# Patient Record
Sex: Male | Born: 1945 | Race: Black or African American | Hispanic: No | Marital: Single | Smoking: Former smoker
Health system: Southern US, Community
[De-identification: ages and names within clinical notes are randomized; demographics above are authoritative.]

## PROBLEM LIST (undated history)

## (undated) DIAGNOSIS — D126 Benign neoplasm of colon, unspecified: Secondary | ICD-10-CM

## (undated) DIAGNOSIS — Z9289 Personal history of other medical treatment: Secondary | ICD-10-CM

## (undated) DIAGNOSIS — I10 Essential (primary) hypertension: Secondary | ICD-10-CM

## (undated) DIAGNOSIS — K5790 Diverticulosis of intestine, part unspecified, without perforation or abscess without bleeding: Secondary | ICD-10-CM

## (undated) DIAGNOSIS — I4891 Unspecified atrial fibrillation: Secondary | ICD-10-CM

## (undated) DIAGNOSIS — I639 Cerebral infarction, unspecified: Secondary | ICD-10-CM

## (undated) DIAGNOSIS — R42 Dizziness and giddiness: Secondary | ICD-10-CM

## (undated) DIAGNOSIS — M1990 Unspecified osteoarthritis, unspecified site: Secondary | ICD-10-CM

## (undated) DIAGNOSIS — I2510 Atherosclerotic heart disease of native coronary artery without angina pectoris: Secondary | ICD-10-CM

## (undated) DIAGNOSIS — T7840XA Allergy, unspecified, initial encounter: Secondary | ICD-10-CM

## (undated) DIAGNOSIS — I6529 Occlusion and stenosis of unspecified carotid artery: Secondary | ICD-10-CM

## (undated) DIAGNOSIS — K635 Polyp of colon: Secondary | ICD-10-CM

## (undated) DIAGNOSIS — U071 COVID-19: Secondary | ICD-10-CM

## (undated) DIAGNOSIS — E870 Hyperosmolality and hypernatremia: Secondary | ICD-10-CM

## (undated) DIAGNOSIS — I4892 Unspecified atrial flutter: Secondary | ICD-10-CM

## (undated) DIAGNOSIS — A045 Campylobacter enteritis: Secondary | ICD-10-CM

## (undated) DIAGNOSIS — E785 Hyperlipidemia, unspecified: Secondary | ICD-10-CM

## (undated) DIAGNOSIS — K219 Gastro-esophageal reflux disease without esophagitis: Secondary | ICD-10-CM

## (undated) HISTORY — DX: Polyp of colon: K63.5

## (undated) HISTORY — PX: TONSILLECTOMY: SUR1361

## (undated) HISTORY — DX: Personal history of other medical treatment: Z92.89

## (undated) HISTORY — PX: CARDIAC CATHETERIZATION: SHX172

## (undated) HISTORY — DX: Unspecified atrial fibrillation: I48.91

## (undated) HISTORY — DX: Gastro-esophageal reflux disease without esophagitis: K21.9

## (undated) HISTORY — DX: Allergy, unspecified, initial encounter: T78.40XA

## (undated) HISTORY — DX: Essential (primary) hypertension: I10

## (undated) HISTORY — DX: COVID-19: U07.1

## (undated) HISTORY — DX: Campylobacter enteritis: A04.5

## (undated) HISTORY — DX: Dizziness and giddiness: R42

## (undated) HISTORY — DX: Unspecified atrial flutter: I48.92

## (undated) HISTORY — DX: Benign neoplasm of colon, unspecified: D12.6

## (undated) HISTORY — PX: KNEE SURGERY: SHX244

## (undated) HISTORY — DX: Cerebral infarction, unspecified: I63.9

## (undated) HISTORY — DX: Atherosclerotic heart disease of native coronary artery without angina pectoris: I25.10

## (undated) HISTORY — DX: Diverticulosis of intestine, part unspecified, without perforation or abscess without bleeding: K57.90

## (undated) HISTORY — DX: Unspecified osteoarthritis, unspecified site: M19.90

## (undated) HISTORY — DX: Occlusion and stenosis of unspecified carotid artery: I65.29

## (undated) HISTORY — DX: Hyperosmolality and hypernatremia: E87.0

## (undated) HISTORY — DX: Hyperlipidemia, unspecified: E78.5

---

## 1968-10-23 HISTORY — PX: SHOULDER ARTHROSCOPY: SHX128

## 1968-10-23 HISTORY — PX: KNEE ARTHROSCOPY: SUR90

## 2011-10-24 HISTORY — PX: CARDIOVERSION: SHX1299

## 2011-11-21 ENCOUNTER — Encounter: Payer: Self-pay | Admitting: Cardiovascular Disease

## 2011-11-21 LAB — BASIC METABOLIC PANEL
Anion Gap: 10 (ref 7–16)
BUN: 16 mg/dL (ref 7–18)
Calcium, Total: 9 mg/dL (ref 8.5–10.1)
Chloride: 106 mmol/L (ref 98–107)
Co2: 30 mmol/L (ref 21–32)
Creatinine: 0.93 mg/dL (ref 0.60–1.30)
EGFR (African American): >60
EGFR (Non-African Amer.): >60
Glucose: 87 mg/dL (ref 65–99)
Osmolality: 291 (ref 275–301)
Potassium: 4.6 mmol/L (ref 3.5–5.1)
Sodium: 146 mmol/L — ABNORMAL HIGH (ref 136–145)

## 2011-11-21 LAB — CBC
HCT: 39.4 % — ABNORMAL LOW (ref 40.0–52.0)
HGB: 13.7 g/dL (ref 13.0–18.0)
MCH: 33.2 pg (ref 26.0–34.0)
MCHC: 34.7 g/dL (ref 32.0–36.0)
MCV: 96 fL (ref 80–100)
Platelet: 152 10*3/uL (ref 150–440)
RBC: 4.12 10*6/uL — ABNORMAL LOW (ref 4.40–5.90)
RDW: 12.9 % (ref 11.5–14.5)
WBC: 10.6 10*3/uL (ref 3.8–10.6)

## 2011-11-21 LAB — TROPONIN I: Troponin-I: <0.02 ng/mL

## 2011-11-22 ENCOUNTER — Encounter: Payer: Self-pay | Admitting: Cardiovascular Disease

## 2011-11-22 ENCOUNTER — Inpatient Hospital Stay: Payer: Self-pay | Admitting: Internal Medicine

## 2011-11-22 DIAGNOSIS — I059 Rheumatic mitral valve disease, unspecified: Secondary | ICD-10-CM

## 2011-11-22 DIAGNOSIS — I4891 Unspecified atrial fibrillation: Secondary | ICD-10-CM

## 2011-11-22 DIAGNOSIS — G459 Transient cerebral ischemic attack, unspecified: Secondary | ICD-10-CM

## 2011-11-22 LAB — LIPID PANEL
Cholesterol: 192 mg/dL (ref 0–200)
HDL Cholesterol: 62 mg/dL — ABNORMAL HIGH (ref 40–60)
Ldl Cholesterol, Calc: 120 mg/dL — ABNORMAL HIGH (ref 0–100)
Triglycerides: 52 mg/dL (ref 0–200)
VLDL Cholesterol, Calc: 10 mg/dL (ref 5–40)

## 2011-11-22 LAB — APTT
Activated PTT: 30 secs (ref 23.6–35.9)
Activated PTT: >160 secs (ref 23.6–35.9)

## 2011-11-22 LAB — CK TOTAL AND CKMB (NOT AT ARMC)
CK, Total: 102 U/L (ref 35–232)
CK-MB: 0.9 ng/mL (ref 0.5–3.6)

## 2011-11-22 LAB — TROPONIN I: Troponin-I: <0.02 ng/mL

## 2011-11-24 HISTORY — PX: OTHER SURGICAL HISTORY: SHX169

## 2011-12-01 ENCOUNTER — Ambulatory Visit (INDEPENDENT_AMBULATORY_CARE_PROVIDER_SITE_OTHER): Payer: Medicare Other | Admitting: Internal Medicine

## 2011-12-01 ENCOUNTER — Encounter: Payer: Self-pay | Admitting: Internal Medicine

## 2011-12-01 ENCOUNTER — Encounter: Payer: Self-pay | Admitting: Cardiovascular Disease

## 2011-12-01 ENCOUNTER — Encounter: Payer: Medicare Other | Admitting: Cardiovascular Disease

## 2011-12-01 ENCOUNTER — Ambulatory Visit (INDEPENDENT_AMBULATORY_CARE_PROVIDER_SITE_OTHER): Payer: Medicare Other | Admitting: Cardiovascular Disease

## 2011-12-01 VITALS — BP 123/79 | HR 80 | Ht 71.0 in | Wt 196.8 lb

## 2011-12-01 DIAGNOSIS — Z23 Encounter for immunization: Secondary | ICD-10-CM

## 2011-12-01 DIAGNOSIS — Z1211 Encounter for screening for malignant neoplasm of colon: Secondary | ICD-10-CM

## 2011-12-01 DIAGNOSIS — G459 Transient cerebral ischemic attack, unspecified: Secondary | ICD-10-CM

## 2011-12-01 DIAGNOSIS — I4891 Unspecified atrial fibrillation: Secondary | ICD-10-CM | POA: Insufficient documentation

## 2011-12-01 DIAGNOSIS — F17200 Nicotine dependence, unspecified, uncomplicated: Secondary | ICD-10-CM

## 2011-12-01 DIAGNOSIS — E785 Hyperlipidemia, unspecified: Secondary | ICD-10-CM | POA: Insufficient documentation

## 2011-12-01 DIAGNOSIS — J309 Allergic rhinitis, unspecified: Secondary | ICD-10-CM | POA: Insufficient documentation

## 2011-12-01 MED ORDER — ZOSTER VACCINE LIVE 19400 UNT/0.65ML ~~LOC~~ SOLR: 0.6500 mL | Freq: Once | SUBCUTANEOUS | Status: AC

## 2011-12-01 MED ORDER — METOPROLOL TARTRATE 25 MG PO TABS: 25.0000 mg | ORAL_TABLET | Freq: Two times a day (BID) | ORAL | Status: DC

## 2011-12-01 MED ORDER — FLUTICASONE PROPIONATE 50 MCG/ACT NA SUSP: 2.0000 | Freq: Every day | NASAL | Status: DC

## 2011-12-01 MED ORDER — LOVASTATIN 20 MG PO TABS: 20.0000 mg | ORAL_TABLET | Freq: Every day | ORAL | Status: DC

## 2011-12-01 MED ORDER — DABIGATRAN ETEXILATE MESYLATE 150 MG PO CAPS
150.0000 mg | ORAL_CAPSULE | Freq: Two times a day (BID) | ORAL | Status: DC
Start: 2011-12-01 — End: 2011-12-25

## 2011-12-01 NOTE — Assessment & Plan Note (Addendum)
He was evaluated by Dr. Chestine Spore in the hospital. CT Scan and MRI did not show significant stroke. We have suggested he stay on his anticoagulation. Now back to baseline.

## 2011-12-01 NOTE — Assessment & Plan Note (Signed)
Followed by cardiology. On Pradaxa for anticoagulation. Metoprolol dose was increased today. Will followup with cardiology in one month and followup here in 3 months.

## 2011-12-01 NOTE — Patient Instructions (Addendum)
You are doing well. Please increase the metoprolol to 1 full pill twice a day Continue on the pradaxa twice a day  Please call us if you have new issues that need to be addressed before your next appt.  Your physician wants you to follow-up in: 1 months.

## 2011-12-01 NOTE — Assessment & Plan Note (Signed)
We will continue pradaxa BID. We have suggested he increase his metoprolol to 25 mg b.i.d.. We will continue anticoagulation for one month and discuss starting amiodarone and possible cardioversion at that time.

## 2011-12-01 NOTE — Progress Notes (Signed)
Patient ID: Carl Alvarez, male    DOB: 04-29-1946, 66 y.o.   MRN: [id number]  HPI Comments: Carl Alvarez is a pleasant 66 yo gentleman with long history of smoking, hypertension, hyperlipidemia who presented to [facility] on November 22 2011 with right upper extremity tingling and right facial numbness, felt to have a possible TIA, found to be in atrial fibrillation.  He was started on low-dose beta blocker,pradaxa 150 mg b.i.d., and now presents to the office for routine followup.  Overall he feels well, much better than when he went into the hospital. He is uncertain how long he has been in atrial fibrillation. He is relatively asymptomatic on today's visit. 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 He has felt occasional palpitations at home in the past week. He has stopped smoking since he left the hospital CT Scan of the head did not show any significant stroke Chest x-ray was clear apart from hyperinflation consistent with COPD  Echocardiogram November 22, 2011 shows ejection fraction 50-55%, otherwise normal study  EKG today shows atrial fibrillation with ventricular rate 90 beats per minute, no significant ST or T wave changes  Total cholesterol 192, LDL 120, HDL 62   Outpatient Encounter Prescriptions as of 12/01/2011  Medication Sig Dispense Refill  . aspirin EC 81 MG tablet Take 81 mg by mouth daily.      . dabigatran (PRADAXA) 150 MG CAPS Take 1 capsule (150 mg total) by mouth every 12 (twelve) hours.  60 capsule  6  . lovastatin (MEVACOR) 20 MG tablet Take 1 tablet (20 mg total) by mouth at bedtime.  90 tablet  3  . metoprolol tartrate (LOPRESSOR) 25 MG tablet Take 1/2 tablet (25 mg total) by mouth 2 (two) times daily.  60 tablet  6     Review of Systems  Constitutional: Negative.   HENT: Negative.   Eyes: Negative.   Respiratory: Negative.   Cardiovascular: Positive for palpitations.  Gastrointestinal: Negative.   Musculoskeletal: Negative.   Skin:  Negative.   Neurological: Negative.   Hematological: Negative.   Psychiatric/Behavioral: Negative.   All other systems reviewed and are negative.    BP 123/79  Pulse 80  Ht 5\' 11"  (1.803 m)  Wt 196 lb 12.8 oz (89.268 kg)  BMI 27.45 kg/m2  Physical Exam  Nursing note and vitals reviewed. Constitutional: He is oriented to person, place, and time. He appears well-developed and well-nourished.  HENT:  Head: Normocephalic.  Nose: Nose normal.  Mouth/Throat: Oropharynx is clear and moist.  Eyes: Conjunctivae are normal. Pupils are equal, round, and reactive to light.  Neck: Normal range of motion. Neck supple. No JVD present.  Cardiovascular: Normal rate, regular rhythm, S1 normal, S2 normal, normal heart sounds and intact distal pulses.  Exam reveals no gallop and no friction rub.   No murmur heard. Pulmonary/Chest: Effort normal and breath sounds normal. No respiratory distress. He has no wheezes. He has no rales. He exhibits no tenderness.  Abdominal: Soft. Bowel sounds are normal. He exhibits no distension. There is no tenderness.  Musculoskeletal: Normal range of motion. He exhibits no edema and no tenderness.  Lymphadenopathy:    He has no cervical adenopathy.  Neurological: He is alert and oriented to person, place, and time. Coordination normal.  Skin: Skin is warm and dry. No rash noted. No erythema.  Psychiatric: He has a normal mood and affect. His behavior is normal. Judgment and thought content normal.  Assessment and Plan

## 2011-12-01 NOTE — Assessment & Plan Note (Signed)
Symptoms have resolved. We'll continue to monitor. Encouraged continued smoking cessation. We'll continue anticoagulation.

## 2011-12-01 NOTE — Progress Notes (Signed)
Subjective:    Patient ID: Carl Alvarez, male    DOB: Aug 26, 1946, 66 y.o.   MRN: [id number]  HPI 66 year old male with history of atrophic fibrillation, TIA/stroke, hyperlipidemia presents to establish care. He reports that he was healthy until a few weeks ago when he developed numbness extending from his right neck down his right shoulder and upper arm. He ultimately sought care and evaluation for this. During his evaluation he was noted to be in atrial fibrillation with rapid ventricular response. He ultimately had CT and MRI evaluation looking for stroke, and these studies were normal. His symptoms resolved with control of his heart rate and anticoagulation. He reports that his numbness in his right arm has completely resolved. He feels back to his baseline. He has quit smoking. He reports that his energy level has significantly improved. He denies any recent chest pain, palpitations, diaphoresis, or other complaints.  He is concerned today about several week history of increased nasal drainage. He notes that he occasionally sneezes and has clear runny nasal drainage. He denies any sinus pressure, headache, fever, chills.  Outpatient Encounter Prescriptions as of 12/01/2011  Medication Sig Dispense Refill  . aspirin EC 81 MG tablet Take 81 mg by mouth daily.      . Cod Liver Oil 5000-500 UNIT/5ML OIL Take by mouth daily.      . dabigatran (PRADAXA) 150 MG CAPS Take 1 capsule (150 mg total) by mouth every 12 (twelve) hours.  60 capsule  6  . lovastatin (MEVACOR) 20 MG tablet Take 1 tablet (20 mg total) by mouth at bedtime.  90 tablet  3  . metoprolol tartrate (LOPRESSOR) 25 MG tablet Take 1 tablet (25 mg total) by mouth 2 (two) times daily.  60 tablet  6  . fluticasone (FLONASE) 50 MCG/ACT nasal spray Place 2 sprays into the nose daily.  16 g  6  . zoster vaccine live, PF, (ZOSTAVAX) 82956 UNT/0.65ML injection Inject 19,400 Units into the skin once.  1 each  0    Review of Systems    Constitutional: Negative for fever, chills, activity change, appetite change, fatigue and unexpected weight change.  HENT: Positive for rhinorrhea, sneezing and postnasal drip.   Eyes: Negative for visual disturbance.  Respiratory: Negative for cough and shortness of breath.   Cardiovascular: Negative for chest pain, palpitations and leg swelling.  Gastrointestinal: Negative for abdominal pain and abdominal distention.  Genitourinary: Negative for dysuria, urgency and difficulty urinating.  Musculoskeletal: Negative for arthralgias and gait problem.  Skin: Negative for color change and rash.  Hematological: Negative for adenopathy.  Psychiatric/Behavioral: Negative for sleep disturbance and dysphoric mood. The patient is not nervous/anxious.    BP 110/62  Pulse 92  Temp(Src) 97.7 F (36.5 C) (Oral)  Wt 196 lb (88.905 kg)  SpO2 99%     Objective:   Physical Exam  Constitutional: He is oriented to person, place, and time. He appears well-developed and well-nourished. No distress.  HENT:  Head: Normocephalic and atraumatic.  Right Ear: External ear normal.  Left Ear: External ear normal.  Nose: Nose normal.  Mouth/Throat: Oropharynx is clear and moist. No oropharyngeal exudate.  Eyes: Conjunctivae and EOM are normal. Pupils are equal, round, and reactive to light. Right eye exhibits no discharge. Left eye exhibits no discharge. No scleral icterus.  Neck: Normal range of motion. Neck supple. No tracheal deviation present. No thyromegaly present.  Cardiovascular: Normal rate, regular rhythm and normal heart sounds.  Exam reveals no gallop and no friction  rub.   No murmur heard. Pulmonary/Chest: Effort normal and breath sounds normal. No respiratory distress. He has no wheezes. He has no rales. He exhibits no tenderness.  Abdominal: Soft. Bowel sounds are normal. He exhibits no distension and no mass. There is no tenderness. There is no rebound and no guarding.  Musculoskeletal: Normal  range of motion. He exhibits no edema.  Lymphadenopathy:    He has no cervical adenopathy.  Neurological: He is alert and oriented to person, place, and time. No cranial nerve deficit. Coordination normal.  Skin: Skin is warm and dry. No rash noted. He is not diaphoretic. No erythema. No pallor.  Psychiatric: He has a normal mood and affect. His behavior is normal. Judgment and thought content normal.          Assessment & Plan:

## 2011-12-01 NOTE — Assessment & Plan Note (Signed)
He asked about options for control her cholesterol besides statin medication. Strongly encouraged him to stay on statin drug given his recent TIA/stroke. Will repeat LFTs and lipids in 6 months.

## 2011-12-01 NOTE — Assessment & Plan Note (Signed)
Congratulated him on smoking cessation. Encouraged him to continue to abstain from smoking.

## 2011-12-01 NOTE — Assessment & Plan Note (Signed)
We have encouraged him to stay on his lovastatin

## 2011-12-01 NOTE — Assessment & Plan Note (Signed)
Nasal congestion appears to be secondary to allergic rhinitis. Will start nasal steroid to see if any improvement. Followup in 3 months.

## 2011-12-01 NOTE — Assessment & Plan Note (Signed)
I am excited that he has stopped smoking since his recent discharge from the hospital. We have encouraged him to continue smoking cessation.

## 2011-12-11 ENCOUNTER — Telehealth: Payer: Self-pay

## 2011-12-11 MED ORDER — METOPROLOL TARTRATE 25 MG PO TABS: 25.0000 mg | ORAL_TABLET | Freq: Two times a day (BID) | ORAL | Status: DC

## 2011-12-11 NOTE — Telephone Encounter (Signed)
Refill sent for metoprolol tart 25 mg bid to [facility].

## 2011-12-25 ENCOUNTER — Other Ambulatory Visit: Payer: Self-pay | Admitting: Cardiovascular Disease

## 2011-12-25 DIAGNOSIS — I4891 Unspecified atrial fibrillation: Secondary | ICD-10-CM

## 2011-12-25 MED ORDER — DABIGATRAN ETEXILATE MESYLATE 150 MG PO CAPS: 150.0000 mg | ORAL_CAPSULE | Freq: Two times a day (BID) | ORAL | Status: DC

## 2011-12-29 ENCOUNTER — Encounter: Payer: Self-pay | Admitting: *Deleted

## 2012-01-01 ENCOUNTER — Ambulatory Visit (INDEPENDENT_AMBULATORY_CARE_PROVIDER_SITE_OTHER): Payer: Medicare Other | Admitting: Cardiovascular Disease

## 2012-01-01 ENCOUNTER — Encounter: Payer: Self-pay | Admitting: *Deleted

## 2012-01-01 ENCOUNTER — Encounter: Payer: Self-pay | Admitting: Cardiovascular Disease

## 2012-01-01 VITALS — BP 130/82 | HR 81 | Ht 71.0 in | Wt 199.0 lb

## 2012-01-01 DIAGNOSIS — G459 Transient cerebral ischemic attack, unspecified: Secondary | ICD-10-CM

## 2012-01-01 DIAGNOSIS — F17200 Nicotine dependence, unspecified, uncomplicated: Secondary | ICD-10-CM

## 2012-01-01 DIAGNOSIS — I4891 Unspecified atrial fibrillation: Secondary | ICD-10-CM

## 2012-01-01 MED ORDER — AMIODARONE HCL 200 MG PO TABS: 200.0000 mg | ORAL_TABLET | Freq: Two times a day (BID) | ORAL | Status: DC

## 2012-01-01 NOTE — Assessment & Plan Note (Signed)
We had a long discussion to talk about his atrial fibrillation. At the end of the discussion, he would like to try for a cardioversion, particularly pharmacological. We will start him on amiodarone 200 mg twice a day. We'll meet again in 2 weeks' time and check an EKG. At that time, we could discuss cardioversion if he is interested. If he has been in atrial fibrillation for years as he believes, It may be very difficult to get him out of atrial fibrillation.

## 2012-01-01 NOTE — Assessment & Plan Note (Signed)
Congratulated him on being able to stop smoking since his recent discharge from the hospital

## 2012-01-01 NOTE — Assessment & Plan Note (Signed)
Recovered fully from his TIA. He is on Pradaxa 150 mg twice a day

## 2012-01-01 NOTE — Progress Notes (Signed)
Patient ID: Carl Alvarez, male    DOB: 1945-10-28, 66 y.o.   MRN: [id number]  HPI Comments: Carl Alvarez is a pleasant 66 yo Carl Alvarez with long history of smoking, hypertension, hyperlipidemia who presented to [facility] on November 22 2011 with right upper extremity tingling and right facial numbness, felt to have a possible TIA, found to be in atrial fibrillation.  Carl Alvarez was started on low-dose beta blocker,pradaxa 150 mg b.i.d., Carl Alvarez presents for routine followup.   Carl Alvarez is uncertain how long Carl Alvarez has been in atrial fibrillation. Carl Alvarez believes Carl Alvarez may have had atrial fibrillation for more than 5 years.  Carl Alvarez is relatively asymptomatic on today's visit. 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 Carl Alvarez has felt occasional palpitations at home. Carl Alvarez has stopped smoking since Carl Alvarez left the hospital. CT Scan of the head did not show any significant stroke Chest x-ray was clear apart from hyperinflation consistent with COPD  Echocardiogram November 22, 2011 shows ejection fraction 50-55%, otherwise normal study  EKG today shows atrial fibrillation with ventricular rate 81 beats per minute, no significant ST or T wave changes  Total cholesterol 192, LDL 120, HDL 62   Outpatient Encounter Prescriptions as of 01/01/2012  Medication Sig Dispense Refill  . aspirin EC 81 MG tablet Take 81 mg by mouth daily.      . Cod Liver Oil 5000-500 UNIT/5ML OIL Take by mouth daily.      . dabigatran (PRADAXA) 150 MG CAPS Take 1 capsule (150 mg total) by mouth every 12 (twelve) hours.  60 capsule  6  . fluticasone (FLONASE) 50 MCG/ACT nasal spray Place 2 sprays into the nose daily.  16 g  6  . lovastatin (MEVACOR) 20 MG tablet Take 1 tablet (20 mg total) by mouth at bedtime.  90 tablet  3  . metoprolol tartrate (LOPRESSOR) 25 MG tablet Take 1 tablet (25 mg total) by mouth 2 (two) times daily.  60 tablet  6    Review of Systems  Constitutional: Negative.   HENT: Negative.   Eyes: Negative.   Respiratory:  Negative.   Cardiovascular: Positive for palpitations.  Gastrointestinal: Negative.   Musculoskeletal: Negative.   Skin: Negative.   Neurological: Negative.   Hematological: Negative.   Psychiatric/Behavioral: Negative.   All other systems reviewed and are negative.    BP 130/82  Pulse 81  Ht 5\' 11"  (1.803 m)  Wt 199 lb (90.266 kg)  BMI 27.75 kg/m2  Physical Exam  Nursing note and vitals reviewed. Constitutional: Carl Alvarez is oriented to person, place, and time. Carl Alvarez appears well-developed and well-nourished.  HENT:  Head: Normocephalic.  Nose: Nose normal.  Mouth/Throat: Oropharynx is clear and moist.  Eyes: Conjunctivae are normal. Pupils are equal, round, and reactive to light.  Neck: Normal range of motion. Neck supple. No JVD present.  Cardiovascular: Normal rate, S1 normal, S2 normal, normal heart sounds and intact distal pulses.  An irregularly irregular rhythm present. Exam reveals no gallop and no friction rub.   No murmur heard. Pulmonary/Chest: Effort normal and breath sounds normal. No respiratory distress. Carl Alvarez has no wheezes. Carl Alvarez has no rales. Carl Alvarez exhibits no tenderness.  Abdominal: Soft. Bowel sounds are normal. Carl Alvarez exhibits no distension. There is no tenderness.  Musculoskeletal: Normal range of motion. Carl Alvarez exhibits no edema and no tenderness.  Lymphadenopathy:    Carl Alvarez has no cervical adenopathy.  Neurological: Carl Alvarez is alert and oriented to person, place, and time. Coordination normal.  Skin: Skin is  warm and dry. No rash noted. No erythema.  Psychiatric: Carl Alvarez has a normal mood and affect. His behavior is normal. Judgment and thought content normal.           Assessment and Plan

## 2012-01-01 NOTE — Patient Instructions (Addendum)
You are doing well. Stop the aspirin Start amiodarone 200 mg twice a day  Please call us if you have new issues that need to be addressed before your next appt.  Your physician wants you to follow-up in: 2 weeks

## 2012-01-03 ENCOUNTER — Telehealth: Payer: Self-pay | Admitting: Cardiovascular Disease

## 2012-01-03 NOTE — Telephone Encounter (Signed)
Pt calling to see if the pain in his knee and wonders if this may be causing his heart palps.

## 2012-01-16 ENCOUNTER — Telehealth: Payer: Self-pay

## 2012-01-16 NOTE — Telephone Encounter (Signed)
Patient scheduled for a screening colonoscopy on 02/06/2012 under monitored sedation by Dr. Marva Panda. Can patient hold Pradaxa 5 days prior to procedure? Please advise what to do.

## 2012-01-16 NOTE — Telephone Encounter (Signed)
Letter sent by fax to [facility] for pradaxa mangement for a screening colonoscopy.

## 2012-01-16 NOTE — Telephone Encounter (Signed)
The fax number to [facility]. Is [phone] and phone number is [phone].

## 2012-01-16 NOTE — Telephone Encounter (Signed)
Would only hold pradaxa for two days before colo, reStart the day after the procedure if ok with Dr. Gustavo Lah (depending on how much bleeding during procedure and amount of time needed for recovery)

## 2012-01-16 NOTE — Telephone Encounter (Signed)
Spoke to Dr.Skulskie's nurse was told Dr.Gollan advised to only hold pradaxa 2 days before colonoscopy and restart day after procedure if ok with Dr.Skulskie.

## 2012-01-22 ENCOUNTER — Encounter: Payer: Self-pay | Admitting: Cardiovascular Disease

## 2012-01-22 ENCOUNTER — Ambulatory Visit (INDEPENDENT_AMBULATORY_CARE_PROVIDER_SITE_OTHER): Payer: Medicare Other | Admitting: Cardiovascular Disease

## 2012-01-22 VITALS — BP 142/76 | HR 88 | Resp 18 | Ht 71.0 in | Wt 200.8 lb

## 2012-01-22 DIAGNOSIS — Z01818 Encounter for other preprocedural examination: Secondary | ICD-10-CM

## 2012-01-22 DIAGNOSIS — E785 Hyperlipidemia, unspecified: Secondary | ICD-10-CM

## 2012-01-22 DIAGNOSIS — I4892 Unspecified atrial flutter: Secondary | ICD-10-CM

## 2012-01-22 DIAGNOSIS — R0602 Shortness of breath: Secondary | ICD-10-CM

## 2012-01-22 DIAGNOSIS — Z5181 Encounter for therapeutic drug level monitoring: Secondary | ICD-10-CM

## 2012-01-22 NOTE — Patient Instructions (Addendum)
You are doing well. Please increase metoprolol to 1 1/2 pills twice a day for blood pressure  We will schedule a cardioversion for atrial flutter Stay on all of your medications Do not eat or drink anything after midnight the night before your procedure. You are to report to the [place] entrance @ [facility] for January 30, 2012 arrive at 6:30 am.  Please call us if you have new issues that need to be addressed before your next appt.  Your physician wants you to follow-up in: 1 months.

## 2012-01-22 NOTE — Progress Notes (Signed)
Patient ID: Carl Alvarez, male    DOB: Jul 17, 1946, 66 y.o.   MRN: [id number]  HPI Comments: Mr. Sivils is a pleasant 66 yo gentleman with long history of smoking, hypertension, hyperlipidemia who presented to [facility] on November 22 2011 with right upper extremity tingling and right facial numbness, felt to have a possible TIA, found to be in atrial fibrillation.  He was started on low-dose beta blocker, pradaxa 150 mg b.i.d., he presents for routine followup.  He has stopped smoking since he left the hospital CT Scan of the head did not show any significant stroke Chest x-ray was clear apart from hyperinflation consistent with COPD  Overall he feels well. Mild SOB with exertion, malaise in the AM.   Echocardiogram November 22, 2011 shows ejection fraction 50-55%, otherwise normal study  EKG today shows atrial flutter with ventricular rate 76 beats per minute  Total cholesterol 192, LDL 120, HDL 62   Outpatient Encounter Prescriptions as of 01/22/2012  Medication Sig Dispense Refill  . amiodarone (PACERONE) 200 MG tablet Take 1 tablet (200 mg total) by mouth 2 (two) times daily.  60 tablet  1  . dabigatran (PRADAXA) 150 MG CAPS Take 1 capsule (150 mg total) by mouth every 12 (twelve) hours.  60 capsule  6  . lovastatin (MEVACOR) 20 MG tablet Take 1 tablet (20 mg total) by mouth at bedtime.  90 tablet  3  . metoprolol tartrate (LOPRESSOR) 25 MG tablet Take 1 tablet (25 mg total) by mouth 2 (two) times daily.  60 tablet  6  . Cod Liver Oil 5000-500 UNIT/5ML OIL Take by mouth daily.      . fluticasone (FLONASE) 50 MCG/ACT nasal spray Place 2 sprays into the nose daily.  16 g  6    Review of Systems  Constitutional: Negative.   HENT: Negative.   Eyes: Negative.   Respiratory: Positive for shortness of breath.   Cardiovascular: Positive for palpitations.  Gastrointestinal: Negative.   Musculoskeletal: Negative.   Skin: Negative.   Neurological: Negative.   Hematological: Negative.     Psychiatric/Behavioral: Negative.   All other systems reviewed and are negative.    BP 142/76  Pulse 88  Resp 18  Ht 5' 11"  (1.803 m)  Wt 200 lb 12.8 oz (91.082 kg)  BMI 28.01 kg/m2  Physical Exam  Nursing note and vitals reviewed. Constitutional: He is oriented to person, place, and time. He appears well-developed and well-nourished.  HENT:  Head: Normocephalic.  Nose: Nose normal.  Mouth/Throat: Oropharynx is clear and moist.  Eyes: Conjunctivae are normal. Pupils are equal, round, and reactive to light.  Neck: Normal range of motion. Neck supple. No JVD present.  Cardiovascular: Normal rate, S1 normal, S2 normal, normal heart sounds and intact distal pulses.  An irregularly irregular rhythm present. Exam reveals no gallop and no friction rub.   No murmur heard. Pulmonary/Chest: Effort normal and breath sounds normal. No respiratory distress. He has no wheezes. He has no rales. He exhibits no tenderness.  Abdominal: Soft. Bowel sounds are normal. He exhibits no distension. There is no tenderness.  Musculoskeletal: Normal range of motion. He exhibits no edema and no tenderness.  Lymphadenopathy:    He has no cervical adenopathy.  Neurological: He is alert and oriented to person, place, and time. Coordination normal.  Skin: Skin is warm and dry. No rash noted. No erythema.  Psychiatric: He has a normal mood and affect. His behavior is normal. Judgment and thought content normal.  Assessment and Plan

## 2012-01-22 NOTE — Assessment & Plan Note (Signed)
He appears to be in atrial flutter. We'll schedule him for a cardioversion in the next week. We have suggested he stay on his amiodarone. Will increase metoprolol to 37.5 mg twice a day.

## 2012-01-22 NOTE — Assessment & Plan Note (Signed)
Continue lovastatin

## 2012-01-22 NOTE — Assessment & Plan Note (Signed)
He stopped smoking 2 months ago.

## 2012-01-23 LAB — CBC WITH DIFFERENTIAL/PLATELET
Basophils Absolute: 0 10*3/uL (ref 0.0–0.2)
Basos: 1 % (ref 0–3)
Eos: 2 % (ref 0–7)
Eosinophils Absolute: 0.2 10*3/uL (ref 0.0–0.4)
HCT: 43.5 % (ref 37.5–51.0)
Hemoglobin: 14.7 g/dL (ref 12.6–17.7)
Immature Grans (Abs): 0 10*3/uL (ref 0.0–0.1)
Immature Granulocytes: 0 % (ref 0–2)
Lymphocytes Absolute: 3.8 10*3/uL (ref 0.7–4.5)
Lymphs: 46 % (ref 14–46)
MCH: 32.1 pg (ref 26.6–33.0)
MCHC: 33.8 g/dL (ref 31.5–35.7)
MCV: 95 fL (ref 79–97)
Monocytes Absolute: 0.5 10*3/uL (ref 0.1–1.0)
Monocytes: 6 % (ref 4–13)
Neutrophils Absolute: 3.8 10*3/uL (ref 1.8–7.8)
Neutrophils Relative %: 45 % (ref 40–74)
RBC: 4.58 x10E6/uL (ref 4.14–5.80)
RDW: 12.7 % (ref 12.3–15.4)
WBC: 8.3 10*3/uL (ref 4.0–10.5)

## 2012-01-23 LAB — BASIC METABOLIC PANEL
BUN/Creatinine Ratio: 13 (ref 10–22)
BUN: 15 mg/dL (ref 8–27)
CO2: 22 mmol/L (ref 20–32)
Calcium: 9.2 mg/dL (ref 8.6–10.2)
Chloride: 104 mmol/L (ref 97–108)
Creatinine, Ser: 1.18 mg/dL (ref 0.76–1.27)
GFR calc Af Amer: 74 mL/min/{1.73_m2} (ref >59)
GFR calc non Af Amer: 64 mL/min/{1.73_m2} (ref >59)
Glucose: 86 mg/dL (ref 65–99)
Potassium: 4.3 mmol/L (ref 3.5–5.2)
Sodium: 142 mmol/L (ref 134–144)

## 2012-01-23 LAB — PROTIME-INR
INR: 1.2 (ref 0.8–1.2)
Prothrombin Time: 13.1 s — ABNORMAL HIGH (ref 9.1–12.0)

## 2012-01-24 ENCOUNTER — Telehealth: Payer: Self-pay | Admitting: Cardiovascular Disease

## 2012-01-24 ENCOUNTER — Encounter: Payer: Self-pay | Admitting: Cardiovascular Disease

## 2012-01-24 ENCOUNTER — Ambulatory Visit (INDEPENDENT_AMBULATORY_CARE_PROVIDER_SITE_OTHER): Payer: Medicare Other | Admitting: Cardiovascular Disease

## 2012-01-24 ENCOUNTER — Ambulatory Visit: Payer: Medicare Other | Admitting: Cardiovascular Disease

## 2012-01-24 VITALS — BP 144/81 | HR 66 | Ht 71.0 in | Wt 200.3 lb

## 2012-01-24 DIAGNOSIS — I4891 Unspecified atrial fibrillation: Secondary | ICD-10-CM

## 2012-01-24 NOTE — Progress Notes (Signed)
   Patient ID: Carl Alvarez, male    DOB: [birth year], 66 y.o.   MRN: [id number]  HPI Comments: Carl Alvarez presented today with some concerns about his rhythm. EKG was done that showed atrial flutter. Date for the cardioversion next week was confirmed with him. He'll continue his current medications.      Review of Systems    Physical Exam

## 2012-01-24 NOTE — Patient Instructions (Signed)
We have confirmed the date for cardioversion on April 9 in the morning at [facility].

## 2012-01-24 NOTE — Telephone Encounter (Deleted)
Patient has a test coming up at [facility] on 01/30/12 and wants to r/s so that his daughter can come with him.

## 2012-01-27 ENCOUNTER — Emergency Department: Payer: Self-pay | Admitting: *Deleted

## 2012-01-27 LAB — CBC
HCT: 40 % (ref 40.0–52.0)
HGB: 13.5 g/dL (ref 13.0–18.0)
MCH: 32.4 pg (ref 26.0–34.0)
MCHC: 33.7 g/dL (ref 32.0–36.0)
MCV: 96 fL (ref 80–100)
Platelet: 165 10*3/uL (ref 150–440)
RBC: 4.16 10*6/uL — ABNORMAL LOW (ref 4.40–5.90)
RDW: 12.6 % (ref 11.5–14.5)
WBC: 10 10*3/uL (ref 3.8–10.6)

## 2012-01-27 LAB — BASIC METABOLIC PANEL
Anion Gap: 9 (ref 7–16)
BUN: 17 mg/dL (ref 7–18)
Calcium, Total: 8.7 mg/dL (ref 8.5–10.1)
Chloride: 108 mmol/L — ABNORMAL HIGH (ref 98–107)
Co2: 26 mmol/L (ref 21–32)
Creatinine: 1.15 mg/dL (ref 0.60–1.30)
EGFR (African American): >60
EGFR (Non-African Amer.): >60
Glucose: 123 mg/dL — ABNORMAL HIGH (ref 65–99)
Osmolality: 288 (ref 275–301)
Potassium: 3.4 mmol/L — ABNORMAL LOW (ref 3.5–5.1)
Sodium: 143 mmol/L (ref 136–145)

## 2012-01-27 LAB — CK TOTAL AND CKMB (NOT AT ARMC)
CK, Total: 141 U/L (ref 35–232)
CK-MB: 1.4 ng/mL (ref 0.5–3.6)

## 2012-01-27 LAB — TROPONIN I: Troponin-I: <0.02 ng/mL

## 2012-01-27 LAB — MAGNESIUM: Magnesium: 1.6 mg/dL — ABNORMAL LOW

## 2012-01-30 ENCOUNTER — Ambulatory Visit: Payer: Self-pay | Admitting: Cardiovascular Disease

## 2012-01-30 DIAGNOSIS — I4892 Unspecified atrial flutter: Secondary | ICD-10-CM

## 2012-02-06 ENCOUNTER — Encounter: Payer: Self-pay | Admitting: Cardiovascular Disease

## 2012-02-06 ENCOUNTER — Ambulatory Visit (INDEPENDENT_AMBULATORY_CARE_PROVIDER_SITE_OTHER): Payer: Medicare Other | Admitting: Cardiovascular Disease

## 2012-02-06 VITALS — BP 144/78 | HR 60 | Ht 71.0 in | Wt 196.5 lb

## 2012-02-06 DIAGNOSIS — F17200 Nicotine dependence, unspecified, uncomplicated: Secondary | ICD-10-CM

## 2012-02-06 DIAGNOSIS — I4891 Unspecified atrial fibrillation: Secondary | ICD-10-CM

## 2012-02-06 DIAGNOSIS — E785 Hyperlipidemia, unspecified: Secondary | ICD-10-CM

## 2012-02-06 DIAGNOSIS — G459 Transient cerebral ischemic attack, unspecified: Secondary | ICD-10-CM

## 2012-02-06 NOTE — Assessment & Plan Note (Signed)
No further TIA type symptoms. We'll continue anticoagulation for several more weeks and then change to aspirin. We have suggested he have an EKG when he sees Dr. Gilford Rile to confirm normal sinus rhythm.

## 2012-02-06 NOTE — Patient Instructions (Signed)
You are doing well. Stop the pradaxa in 2 weeks Then start aspirin 81 mg (one in Am and Pm)  Decrease the amiodarone to one a day (instead of twice a day)  Please call us if you have new issues that need to be addressed before your next appt.  Your physician wants you to follow-up in: 6 months.  You will receive a reminder letter in the mail two months in advance. If you don't receive a letter, please call our office to schedule the follow-up appointment.

## 2012-02-06 NOTE — Progress Notes (Signed)
Patient ID: Carl Alvarez, male    DOB: [birth year], 66 y.o.   MRN: [id number]  HPI Comments:  Carl Alvarez is a pleasant 66 yo gentleman with long history of smoking, hypertension, hyperlipidemia who presented to [facility] on November 22 2011 with right upper extremity tingling and right facial numbness, felt to have a possible TIA, found to be in atrial fibrillation.  He was started on low-dose beta blocker, pradaxa 150 mg b.i.d.,  He  stopped smoking since he left the hospital CT Scan of the head did not show any significant stroke Chest x-ray was clear apart from hyperinflation consistent with COPD  Echocardiogram November 22, 2011 shows ejection fraction 50-55%, otherwise normal study  Followup EKG showed atrial flutter. He was started on amiodarone in an attempt to cardiovert pharmacologically. He remained in atrial flutter and underwent DC cardioversion last week (early April 2013) which was successful. He reports that he has felt well since discharge.  EKG today normal sinus rhythm with rate 57 beats per minute, no significant ST-T wave changes  Total cholesterol 192, LDL 120, HDL 62    Outpatient Encounter Prescriptions as of 02/06/2012  Medication Sig Dispense Refill  . amiodarone (PACERONE) 200 MG tablet Take 1 tablet (200 mg total) by mouth 2 (two) times daily.  60 tablet  1  . dabigatran (PRADAXA) 150 MG CAPS Take 1 capsule (150 mg total) by mouth every 12 (twelve) hours.  60 capsule  6  . fluticasone (FLONASE) 50 MCG/ACT nasal spray Place 2 sprays into the nose daily.  16 g  6  . lovastatin (MEVACOR) 20 MG tablet Take 1 tablet (20 mg total) by mouth at bedtime.  90 tablet  3  . metoprolol tartrate (LOPRESSOR) 25 MG tablet Take 1 tablet (25 mg total) by mouth 2 (two) times daily.  60 tablet  6  . multivitamin-iron-minerals-folic acid (CENTRUM) chewable tablet Chew 1 tablet by mouth daily.      [name] DISCONTD: Cod Liver Oil 5000-500 UNIT/5ML OIL Take by mouth daily.        Review of Systems   Constitutional: Negative.   HENT: Negative.   Eyes: Negative.   Respiratory: Negative.   Cardiovascular: Negative.   Gastrointestinal: Negative.   Musculoskeletal: Positive for arthralgias.  Skin: Negative.   Neurological: Negative.   Hematological: Negative.   Psychiatric/Behavioral: Negative.   All other systems reviewed and are negative.    BP 144/78  Pulse 60  Ht 5' 11"  (1.803 m)  Wt 196 lb 8 oz (89.132 kg)  BMI 27.41 kg/m2  Physical Exam  Nursing note and vitals reviewed. Constitutional: He is oriented to person, place, and time. He appears well-developed and well-nourished.  HENT:  Head: Normocephalic.  Nose: Nose normal.  Mouth/Throat: Oropharynx is clear and moist.  Eyes: Conjunctivae are normal. Pupils are equal, round, and reactive to light.  Neck: Normal range of motion. Neck supple. No JVD present.  Cardiovascular: Normal rate, regular rhythm, S1 normal, S2 normal, normal heart sounds and intact distal pulses.  Exam reveals no gallop and no friction rub.   No murmur heard. Pulmonary/Chest: Effort normal and breath sounds normal. No respiratory distress. He has no wheezes. He has no rales. He exhibits no tenderness.  Abdominal: Soft. Bowel sounds are normal. He exhibits no distension. There is no tenderness.  Musculoskeletal: Normal range of motion. He exhibits no edema and no tenderness.  Lymphadenopathy:    He has no cervical adenopathy.  Neurological: He is alert and oriented to person,  place, and time. Coordination normal.  Skin: Skin is warm and dry. No rash noted. No erythema.  Psychiatric: He has a normal mood and affect. His behavior is normal. Judgment and thought content normal.           Assessment and Plan

## 2012-02-06 NOTE — Telephone Encounter (Signed)
ENCOUNTER COMPLETED

## 2012-02-06 NOTE — Assessment & Plan Note (Signed)
He has stopped smoking 3 months ago.

## 2012-02-06 NOTE — Assessment & Plan Note (Signed)
Recent cardioversion for atrial flutter. He is maintaining normal sinus rhythm. We'll decrease the amiodarone to 200 mg daily, continue metoprolol 37.5 mg twice a day. We'll stop the pradax in 2 weeks.

## 2012-02-06 NOTE — Assessment & Plan Note (Signed)
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 Uncertain if additional labs  Will be needed. We will wait until after that visit and can do lipids if needed.

## 2012-02-07 ENCOUNTER — Encounter: Payer: Self-pay | Admitting: Cardiovascular Disease

## 2012-02-19 ENCOUNTER — Other Ambulatory Visit: Payer: Self-pay | Admitting: Cardiovascular Disease

## 2012-02-19 DIAGNOSIS — I4892 Unspecified atrial flutter: Secondary | ICD-10-CM

## 2012-02-28 ENCOUNTER — Ambulatory Visit (INDEPENDENT_AMBULATORY_CARE_PROVIDER_SITE_OTHER): Payer: Medicare Other | Admitting: Internal Medicine

## 2012-02-28 ENCOUNTER — Encounter: Payer: Self-pay | Admitting: Internal Medicine

## 2012-02-28 ENCOUNTER — Other Ambulatory Visit: Payer: Self-pay | Admitting: *Deleted

## 2012-02-28 VITALS — BP 120/82 | HR 53 | Temp 98.6°F | Ht 71.0 in | Wt 201.0 lb

## 2012-02-28 DIAGNOSIS — I4891 Unspecified atrial fibrillation: Secondary | ICD-10-CM

## 2012-02-28 DIAGNOSIS — L819 Disorder of pigmentation, unspecified: Secondary | ICD-10-CM

## 2012-02-28 DIAGNOSIS — J309 Allergic rhinitis, unspecified: Secondary | ICD-10-CM

## 2012-02-28 DIAGNOSIS — E785 Hyperlipidemia, unspecified: Secondary | ICD-10-CM

## 2012-02-28 LAB — COMPREHENSIVE METABOLIC PANEL
ALT: 57 U/L — ABNORMAL HIGH (ref 0–53)
AST: 48 U/L — ABNORMAL HIGH (ref 0–37)
Albumin: 3.9 g/dL (ref 3.5–5.2)
Alkaline Phosphatase: 65 U/L (ref 39–117)
BUN: 16 mg/dL (ref 6–23)
CO2: 30 mEq/L (ref 19–32)
Calcium: 9.4 mg/dL (ref 8.4–10.5)
Chloride: 104 mEq/L (ref 96–112)
Creatinine, Ser: 1.1 mg/dL (ref 0.4–1.5)
GFR: 83.29 mL/min (ref >60.00)
Glucose, Bld: 96 mg/dL (ref 70–99)
Potassium: 4 mEq/L (ref 3.5–5.1)
Sodium: 141 mEq/L (ref 135–145)
Total Bilirubin: 0.6 mg/dL (ref 0.3–1.2)
Total Protein: 7.3 g/dL (ref 6.0–8.3)

## 2012-02-28 LAB — LIPID PANEL
Cholesterol: 207 mg/dL — ABNORMAL HIGH (ref 0–200)
HDL: 70.9 mg/dL (ref >39.00)
Total CHOL/HDL Ratio: 3
Triglycerides: 69 mg/dL (ref 0.0–149.0)
VLDL: 13.8 mg/dL (ref 0.0–40.0)

## 2012-02-28 LAB — LDL CHOLESTEROL, DIRECT: Direct LDL: 131.9 mg/dL

## 2012-02-28 MED ORDER — ATORVASTATIN CALCIUM 20 MG PO TABS: 20.0000 mg | ORAL_TABLET | Freq: Every day | ORAL | Status: DC

## 2012-02-28 MED ORDER — LOVASTATIN 20 MG PO TABS: 20.0000 mg | ORAL_TABLET | Freq: Every day | ORAL | Status: DC

## 2012-02-28 NOTE — Assessment & Plan Note (Signed)
Doing very well after cardioversion. He continues in normal sinus rhythm. He has followup with cardiology in 6 months. We'll continue amiodarone and metoprolol.

## 2012-02-28 NOTE — Assessment & Plan Note (Signed)
Will recheck lipids and LFTs with labs today. Followup in 6 months.

## 2012-02-28 NOTE — Progress Notes (Signed)
Subjective:    Patient ID: Carl Alvarez, male    DOB: [birth year], 65 y.o.   MRN: [id number]  HPI 66 year old male with history of a total fibrillation status post cardioversion in April 2013 presents for followup. He reports significant improvement in his overall energy level since cardioversion. He is now off anticoagulation. He continues on metoprolol and amiodarone. He denies any palpitations, shortness of breath, chest pain. He reports he is feeling well.  His only concern today is some lightening of the skin over his fingers. 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 He questions if this may be causing the lightening. He does not have any itching but occasionally has dryness over his hands. He has no other spots on his body of hypopigmentation.  Outpatient Encounter Prescriptions as of 02/28/2012  Medication Sig Dispense Refill  . amiodarone (PACERONE) 200 MG tablet Take 1 tablet (200 mg total) by mouth 2 (two) times daily.  60 tablet  1  . fluticasone (FLONASE) 50 MCG/ACT nasal spray Place 2 sprays into the nose daily.  16 g  6  . lovastatin (MEVACOR) 20 MG tablet Take 1 tablet (20 mg total) by mouth at bedtime.  90 tablet  3  . metoprolol tartrate (LOPRESSOR) 25 MG tablet Take 1 tablet (25 mg total) by mouth 2 (two) times daily.  60 tablet  6  . multivitamin-iron-minerals-folic acid (CENTRUM) chewable tablet Chew 1 tablet by mouth daily.      [name] DISCONTD: lovastatin (MEVACOR) 20 MG tablet Take 1 tablet (20 mg total) by mouth at bedtime.  90 tablet  3   BP 120/82  Pulse 53  Temp(Src) 98.6 F (37 C) (Oral)  Ht 5' 11"  (1.803 m)  Wt 201 lb (91.173 kg)  BMI 28.03 kg/m2  SpO2 97%  Review of Systems  Constitutional: Negative for fever, chills, activity change, appetite change, fatigue and unexpected weight change.  Eyes: Negative for visual disturbance.  Respiratory: Negative for cough and shortness of breath.   Cardiovascular: Negative for chest pain,  palpitations and leg swelling.  Gastrointestinal: Negative for abdominal pain and abdominal distention.  Genitourinary: Negative for dysuria, urgency and difficulty urinating.  Musculoskeletal: Negative for arthralgias and gait problem.  Skin: Positive for color change. Negative for rash.  Hematological: Negative for adenopathy.  Psychiatric/Behavioral: Negative for sleep disturbance and dysphoric mood. The patient is not nervous/anxious.        Objective:   Physical Exam  Constitutional: He is oriented to person, place, and time. He appears well-developed and well-nourished. No distress.  HENT:  Head: Normocephalic and atraumatic.  Right Ear: External ear normal.  Left Ear: External ear normal.  Nose: Nose normal.  Mouth/Throat: Oropharynx is clear and moist. No oropharyngeal exudate.  Eyes: Conjunctivae and EOM are normal. Pupils are equal, round, and reactive to light. Right eye exhibits no discharge. Left eye exhibits no discharge. No scleral icterus.  Neck: Normal range of motion. Neck supple. No tracheal deviation present. No thyromegaly present.  Cardiovascular: Normal rate, regular rhythm and normal heart sounds.  Exam reveals no gallop and no friction rub.   No murmur heard. Pulmonary/Chest: Effort normal and breath sounds normal. No respiratory distress. He has no wheezes. He has no rales. He exhibits no tenderness.  Musculoskeletal: Normal range of motion. He exhibits no edema.  Lymphadenopathy:    He has no cervical adenopathy.  Neurological: He is alert and oriented to person, place, and time. No cranial nerve deficit. Coordination  normal.  Skin: Skin is warm and dry. No rash noted. He is not diaphoretic. No erythema. No pallor.     Psychiatric: He has a normal mood and affect. His behavior is normal. Judgment and thought content normal.          Assessment & Plan:

## 2012-02-28 NOTE — Assessment & Plan Note (Signed)
Bilateral hands. Suspect related to use of harsh chemicals. Encouraged him to wear gloves at work. Discussed referral to dermatology, but he would prefer to delay for now. He will call his symptoms are persistent.

## 2012-02-28 NOTE — Assessment & Plan Note (Signed)
Symptoms improved with use of Zyrtec. Will continue.

## 2012-03-20 ENCOUNTER — Other Ambulatory Visit: Payer: Self-pay

## 2012-03-20 MED ORDER — AMIODARONE HCL 200 MG PO TABS: 200.0000 mg | ORAL_TABLET | Freq: Every day | ORAL | Status: DC

## 2012-04-01 ENCOUNTER — Other Ambulatory Visit: Payer: Medicare Other

## 2012-05-10 ENCOUNTER — Other Ambulatory Visit: Payer: Self-pay | Admitting: *Deleted

## 2012-05-10 MED ORDER — METOPROLOL TARTRATE 25 MG PO TABS: 25.0000 mg | ORAL_TABLET | Freq: Two times a day (BID) | ORAL | Status: DC

## 2012-05-10 NOTE — Telephone Encounter (Signed)
Refilled Metoprolol

## 2012-05-27 ENCOUNTER — Other Ambulatory Visit: Payer: Self-pay | Admitting: *Deleted

## 2012-05-27 MED ORDER — ATORVASTATIN CALCIUM 20 MG PO TABS: 20.0000 mg | ORAL_TABLET | Freq: Every day | ORAL | Status: DC

## 2012-06-20 ENCOUNTER — Telehealth: Payer: Self-pay | Admitting: Internal Medicine

## 2012-06-20 NOTE — Telephone Encounter (Signed)
We will need to discuss at visit.

## 2012-06-20 NOTE — Telephone Encounter (Signed)
Pt came in today to make follow up appointmetn  07/08/12 Pt stated that is not sleeping very well.  After 4 hoursof sleep  he tosses and turns after that very tired in am

## 2012-06-20 NOTE — Telephone Encounter (Signed)
Patient advised via telephone, he will discuss this with Dr. Dan Humphreys at f/u appt.

## 2012-07-08 ENCOUNTER — Ambulatory Visit (INDEPENDENT_AMBULATORY_CARE_PROVIDER_SITE_OTHER): Payer: Medicare Other | Admitting: Internal Medicine

## 2012-07-08 ENCOUNTER — Telehealth: Payer: Self-pay | Admitting: Internal Medicine

## 2012-07-08 ENCOUNTER — Encounter: Payer: Self-pay | Admitting: Internal Medicine

## 2012-07-08 VITALS — BP 152/78 | HR 58 | Temp 98.5°F | Resp 16 | Wt 198.5 lb

## 2012-07-08 DIAGNOSIS — M1710 Unilateral primary osteoarthritis, unspecified knee: Secondary | ICD-10-CM

## 2012-07-08 DIAGNOSIS — I10 Essential (primary) hypertension: Secondary | ICD-10-CM

## 2012-07-08 DIAGNOSIS — Z23 Encounter for immunization: Secondary | ICD-10-CM

## 2012-07-08 DIAGNOSIS — Z1211 Encounter for screening for malignant neoplasm of colon: Secondary | ICD-10-CM

## 2012-07-08 DIAGNOSIS — J019 Acute sinusitis, unspecified: Secondary | ICD-10-CM | POA: Insufficient documentation

## 2012-07-08 DIAGNOSIS — IMO0002 Reserved for concepts with insufficient information to code with codable children: Secondary | ICD-10-CM

## 2012-07-08 DIAGNOSIS — M1711 Unilateral primary osteoarthritis, right knee: Secondary | ICD-10-CM

## 2012-07-08 DIAGNOSIS — E785 Hyperlipidemia, unspecified: Secondary | ICD-10-CM

## 2012-07-08 LAB — COMPREHENSIVE METABOLIC PANEL
ALT: 30 U/L (ref 0–53)
AST: 26 U/L (ref 0–37)
Albumin: 3.8 g/dL (ref 3.5–5.2)
Alkaline Phosphatase: 61 U/L (ref 39–117)
BUN: 11 mg/dL (ref 6–23)
CO2: 29 mEq/L (ref 19–32)
Calcium: 8.8 mg/dL (ref 8.4–10.5)
Chloride: 107 mEq/L (ref 96–112)
Creatinine, Ser: 1 mg/dL (ref 0.4–1.5)
GFR: 100.42 mL/min (ref >60.00)
Glucose, Bld: 93 mg/dL (ref 70–99)
Potassium: 3.5 mEq/L (ref 3.5–5.1)
Sodium: 141 mEq/L (ref 135–145)
Total Bilirubin: 0.7 mg/dL (ref 0.3–1.2)
Total Protein: 6.7 g/dL (ref 6.0–8.3)

## 2012-07-08 LAB — LIPID PANEL
Cholesterol: 165 mg/dL (ref 0–200)
HDL: 66.9 mg/dL (ref >39.00)
LDL Cholesterol: 90 mg/dL (ref 0–99)
Total CHOL/HDL Ratio: 2
Triglycerides: 41 mg/dL (ref 0.0–149.0)
VLDL: 8.2 mg/dL (ref 0.0–40.0)

## 2012-07-08 MED ORDER — SULFAMETHOXAZOLE-TRIMETHOPRIM 800-160 MG PO TABS: 1.0000 | ORAL_TABLET | Freq: Two times a day (BID) | ORAL | Status: AC

## 2012-07-08 NOTE — Assessment & Plan Note (Signed)
Symptoms are consistent with early sinusitis. Will treat with Bactrim. Patient will call if symptoms are not improving.

## 2012-07-08 NOTE — Progress Notes (Signed)
Subjective:    Patient ID: Carl Alvarez, male    DOB: [birth year], 66 y.o.   MRN: [id number]  HPI 66 year old male with history of atrial fibrillation, hyperlipidemia, hypertension presents for followup. He reports he is generally been doing well. He notes that over the last week he has had worsening of his allergy symptoms with increased sinus pressure and facial pain. He denies any fever or chills. He has been using Benadryl and Flonase with no improvement. Next  He is also concerned today about several month history of right knee pain. He reports that he was evaluated in the past by orthopedics and had steroid injection with no improvement in his symptoms. He was told at that time he was a candidate for knee replacement. He is interested in proceeding at this point given the knee pain it limits his ability to both function at work and exercise.  Outpatient Encounter Prescriptions as of 07/08/2012  Medication Sig Dispense Refill  . amiodarone (PACERONE) 200 MG tablet Take 1 tablet (200 mg total) by mouth daily.  30 tablet  3  . atorvastatin (LIPITOR) 20 MG tablet Take 1 tablet (20 mg total) by mouth daily.  30 tablet  6  . fluticasone (FLONASE) 50 MCG/ACT nasal spray Place 2 sprays into the nose daily.  16 g  6  . lovastatin (MEVACOR) 20 MG tablet Take 1 tablet (20 mg total) by mouth at bedtime.  90 tablet  3  . metoprolol tartrate (LOPRESSOR) 25 MG tablet Take 1 tablet (25 mg total) by mouth 2 (two) times daily.  60 tablet  6  . multivitamin-iron-minerals-folic acid (CENTRUM) chewable tablet Chew 1 tablet by mouth daily.      [name] sulfamethoxazole-trimethoprim (BACTRIM DS,SEPTRA DS) 800-160 MG per tablet Take 1 tablet by mouth 2 (two) times daily.  20 tablet  0  . DISCONTD: amiodarone (PACERONE) 200 MG tablet Take 200 mg by mouth daily.       BP 152/78  Pulse 58  Temp 98.5 F (36.9 C) (Oral)  Resp 16  Wt 198 lb 8 oz (90.039 kg)  SpO2 96%  Review of Systems  Constitutional: Negative for  fever, chills, activity change, appetite change, fatigue and unexpected weight change.  HENT: Positive for congestion, rhinorrhea, sneezing, postnasal drip and sinus pressure. Negative for hearing loss, ear pain, neck pain and ear discharge.   Eyes: Negative for visual disturbance.  Respiratory: Negative for cough and shortness of breath.   Cardiovascular: Negative for chest pain, palpitations and leg swelling.  Gastrointestinal: Negative for abdominal pain and abdominal distention.  Genitourinary: Negative for dysuria, urgency and difficulty urinating.  Musculoskeletal: Positive for myalgias, joint swelling and arthralgias. Negative for gait problem.  Skin: Negative for color change and rash.  Hematological: Negative for adenopathy.  Psychiatric/Behavioral: Negative for disturbed wake/sleep cycle and dysphoric mood. The patient is not nervous/anxious.        Objective:   Physical Exam  Constitutional: He is oriented to person, place, and time. He appears well-developed and well-nourished. No distress.  HENT:  Head: Normocephalic and atraumatic.  Right Ear: External ear normal.  Left Ear: External ear normal.  Nose: Nose normal.  Mouth/Throat: Oropharynx is clear and moist. No oropharyngeal exudate.  Eyes: Conjunctivae normal and EOM are normal. Pupils are equal, round, and reactive to light. Right eye exhibits no discharge. Left eye exhibits no discharge. No scleral icterus.  Neck: Normal range of motion. Neck supple. No tracheal deviation present. No thyromegaly present.  Cardiovascular: Normal rate, regular  rhythm and normal heart sounds.  Exam reveals no gallop and no friction rub.   No murmur heard. Pulmonary/Chest: Effort normal and breath sounds normal. No respiratory distress. He has no wheezes. He has no rales. He exhibits no tenderness.  Musculoskeletal: He exhibits no edema.       Right knee: He exhibits decreased range of motion and swelling.  Lymphadenopathy:    He has no  cervical adenopathy.  Neurological: He is alert and oriented to person, place, and time. No cranial nerve deficit. Coordination normal.  Skin: Skin is warm and dry. No rash noted. He is not diaphoretic. No erythema. No pallor.  Psychiatric: He has a normal mood and affect. His behavior is normal. Judgment and thought content normal.          Assessment & Plan:

## 2012-07-08 NOTE — Assessment & Plan Note (Signed)
Will check lipids and LFTs with labs today. Followup 6 months.

## 2012-07-08 NOTE — Assessment & Plan Note (Signed)
Symptoms and exam are consistent with osteoarthritis of the right knee. Will set up evaluation with orthopedics. Note that patient has had steroid injection in the past with no improvement.

## 2012-07-08 NOTE — Telephone Encounter (Signed)
Patient forgot to mention at his visit he is due for a colonoscopy.  Please advise.

## 2012-07-08 NOTE — Telephone Encounter (Signed)
Order placed

## 2012-07-08 NOTE — Telephone Encounter (Signed)
At check out pt mentioned he wanted a colonscopy

## 2012-07-08 NOTE — Telephone Encounter (Signed)
Left detailed message on patients home phone notifying him Erie Noe will be contacting him with the colonoscopy appt.

## 2012-07-08 NOTE — Assessment & Plan Note (Signed)
BP elevated today on initial check. Improved to 128/82 on repeat. Will continue to monitor. Pt will call if BP consistently >140/90. Has follow up with cardiologist 07/2012. Follow up here 6 months.

## 2012-07-17 ENCOUNTER — Other Ambulatory Visit: Payer: Self-pay | Admitting: *Deleted

## 2012-07-17 MED ORDER — AMIODARONE HCL 200 MG PO TABS: 200.0000 mg | ORAL_TABLET | Freq: Every day | ORAL | Status: DC

## 2012-07-17 NOTE — Telephone Encounter (Signed)
Refilled Amiodarone. 

## 2012-07-21 ENCOUNTER — Emergency Department: Payer: Self-pay | Admitting: Emergency Medicine

## 2012-07-21 LAB — BASIC METABOLIC PANEL
Anion Gap: 10 (ref 7–16)
BUN: 18 mg/dL (ref 7–18)
Calcium, Total: 8.6 mg/dL (ref 8.5–10.1)
Chloride: 108 mmol/L — ABNORMAL HIGH (ref 98–107)
Co2: 27 mmol/L (ref 21–32)
Creatinine: 1.08 mg/dL (ref 0.60–1.30)
EGFR (African American): >60
EGFR (Non-African Amer.): >60
Glucose: 151 mg/dL — ABNORMAL HIGH (ref 65–99)
Osmolality: 294 (ref 275–301)
Potassium: 3.8 mmol/L (ref 3.5–5.1)
Sodium: 145 mmol/L (ref 136–145)

## 2012-07-21 LAB — CK TOTAL AND CKMB (NOT AT ARMC)
CK, Total: 135 U/L (ref 35–232)
CK-MB: 1.1 ng/mL (ref 0.5–3.6)

## 2012-07-21 LAB — TROPONIN I: Troponin-I: <0.02 ng/mL

## 2012-07-21 LAB — CBC
HCT: 38.8 % — ABNORMAL LOW (ref 40.0–52.0)
HGB: 13.2 g/dL (ref 13.0–18.0)
MCH: 31.9 pg (ref 26.0–34.0)
MCHC: 34.2 g/dL (ref 32.0–36.0)
MCV: 93 fL (ref 80–100)
Platelet: 193 10*3/uL (ref 150–440)
RBC: 4.15 10*6/uL — ABNORMAL LOW (ref 4.40–5.90)
RDW: 13 % (ref 11.5–14.5)
WBC: 16.4 10*3/uL — ABNORMAL HIGH (ref 3.8–10.6)

## 2012-07-30 ENCOUNTER — Encounter: Payer: Self-pay | Admitting: Cardiovascular Disease

## 2012-07-30 ENCOUNTER — Ambulatory Visit (INDEPENDENT_AMBULATORY_CARE_PROVIDER_SITE_OTHER): Payer: Medicare Other | Admitting: Cardiovascular Disease

## 2012-07-30 VITALS — BP 162/80 | HR 58 | Ht 71.0 in | Wt 204.8 lb

## 2012-07-30 DIAGNOSIS — I4891 Unspecified atrial fibrillation: Secondary | ICD-10-CM

## 2012-07-30 DIAGNOSIS — I10 Essential (primary) hypertension: Secondary | ICD-10-CM

## 2012-07-30 DIAGNOSIS — E785 Hyperlipidemia, unspecified: Secondary | ICD-10-CM

## 2012-07-30 NOTE — Assessment & Plan Note (Signed)
Cholesterol is at goal on the current lipid regimen. No changes to the medications were made.  

## 2012-07-30 NOTE — Patient Instructions (Addendum)
You are doing well. No medication changes were made.  Please call us if you have new issues that need to be addressed before your next appt.  Your physician wants you to follow-up in: 6 months.  You will receive a reminder letter in the mail two months in advance. If you don't receive a letter, please call our office to schedule the follow-up appointment.   

## 2012-07-30 NOTE — Progress Notes (Signed)
Patient ID: Carl Alvarez, male    DOB: 1946/10/16, 66 y.o.   MRN: [id number]  HPI Comments:  Carl Alvarez is a pleasant 66 yo gentleman with long history of smoking, hypertension, hyperlipidemia who presented to [facility] on November 22 2011 with right upper extremity tingling and right facial numbness, felt to have a possible TIA, found to be in atrial fibrillation.  He was started on low-dose beta blocker, pradaxa 150 mg b.i.d.,  He  stopped smoking since he left the hospital CT Scan of the head did not show any significant stroke Chest x-ray was clear apart from hyperinflation consistent with COPD  Echocardiogram November 22, 2011 shows ejection fraction 50-55%, otherwise normal study  Followup EKG showed atrial flutter. He was started on amiodarone in an attempt to cardiovert pharmacologically. He remained in atrial flutter and underwent DC cardioversion early April 2013 which was successful. He reports that he has felt well since discharge. In followup today, he reports that he feels well with no shortness of breath, no fluttering or other symptoms. He is getting over a sinus infection which has caused some gait instability. He has vivid dreams also with chronic right knee pain. His wife continues to smoke, he has not smoked since earlier this year.  EKG today normal sinus rhythm with rate 58 beats per minute, no significant ST-T wave changes    Outpatient Encounter Prescriptions as of 07/30/2012  Medication Sig Dispense Refill  . amiodarone (PACERONE) 200 MG tablet Take 1 tablet (200 mg total) by mouth daily.  30 tablet  5  . atorvastatin (LIPITOR) 20 MG tablet Take 1 tablet (20 mg total) by mouth daily.  30 tablet  6  . fluticasone (FLONASE) 50 MCG/ACT nasal spray Place 2 sprays into the nose daily.  16 g  6  . metoprolol tartrate (LOPRESSOR) 25 MG tablet Take 1 tablet (25 mg total) by mouth 2 (two) times daily.  60 tablet  6  . multivitamin-iron-minerals-folic acid (CENTRUM) chewable tablet  Chew 1 tablet by mouth daily.        Review of Systems  Constitutional: Negative.   HENT: Negative.   Eyes: Negative.   Respiratory: Negative.   Cardiovascular: Negative.   Gastrointestinal: Negative.   Musculoskeletal: Positive for joint swelling and arthralgias.  Skin: Negative.   Neurological: Negative.   Hematological: Negative.   Psychiatric/Behavioral: Positive for disturbed wake/sleep cycle.  All other systems reviewed and are negative.    BP 162/80  Pulse 58  Ht 5\' 11"  (1.803 m)  Wt 204 lb 12 oz (92.874 kg)  BMI 28.56 kg/m2  Physical Exam  Nursing note and vitals reviewed. Constitutional: He is oriented to person, place, and time. He appears well-developed and well-nourished.  HENT:  Head: Normocephalic.  Nose: Nose normal.  Mouth/Throat: Oropharynx is clear and moist.  Eyes: Conjunctivae normal are normal. Pupils are equal, round, and reactive to light.  Neck: Normal range of motion. Neck supple. No JVD present.  Cardiovascular: Normal rate, regular rhythm, S1 normal, S2 normal, normal heart sounds and intact distal pulses.  Exam reveals no gallop and no friction rub.   No murmur heard. Pulmonary/Chest: Effort normal and breath sounds normal. No respiratory distress. He has no wheezes. He has no rales. He exhibits no tenderness.  Abdominal: Soft. Bowel sounds are normal. He exhibits no distension. There is no tenderness.  Musculoskeletal: Normal range of motion. He exhibits no edema and no tenderness.  Lymphadenopathy:    He has no cervical adenopathy.  Neurological:  He is alert and oriented to person, place, and time. Coordination normal.  Skin: Skin is warm and dry. No rash noted. No erythema.  Psychiatric: He has a normal mood and affect. His behavior is normal. Judgment and thought content normal.           Assessment and Plan

## 2012-07-30 NOTE — Assessment & Plan Note (Addendum)
He is maintaining normal sinus rhythm on current medication regimen. Continue aspirin, amiodarone, beta blocker. If he has additional episodes of atrial flutter, we would refer him for ablation.

## 2012-07-30 NOTE — Assessment & Plan Note (Signed)
Blood pressure is elevated today. We have suggested he closely monitor his blood pressure at home and call our office if it continues to be elevated.

## 2012-08-16 ENCOUNTER — Encounter: Payer: Self-pay | Admitting: Internal Medicine

## 2012-08-19 ENCOUNTER — Encounter: Payer: Self-pay | Admitting: Internal Medicine

## 2012-08-19 ENCOUNTER — Ambulatory Visit (INDEPENDENT_AMBULATORY_CARE_PROVIDER_SITE_OTHER): Payer: Medicare Other | Admitting: Internal Medicine

## 2012-08-19 ENCOUNTER — Ambulatory Visit: Payer: Medicare Other | Admitting: Internal Medicine

## 2012-08-19 VITALS — BP 140/70 | HR 66 | Ht 71.0 in | Wt 201.8 lb

## 2012-08-19 DIAGNOSIS — Z1211 Encounter for screening for malignant neoplasm of colon: Secondary | ICD-10-CM

## 2012-08-19 DIAGNOSIS — Z8679 Personal history of other diseases of the circulatory system: Secondary | ICD-10-CM

## 2012-08-19 MED ORDER — PEG-KCL-NACL-NASULF-NA ASC-C 100 G PO SOLR: 1.0000 | Freq: Once | ORAL | Status: DC

## 2012-08-19 NOTE — Progress Notes (Signed)
Patient ID: Carl Alvarez, male   DOB: 1945/12/13, 66 y.o.   MRN: [id number]  SUBJECTIVE: HPI Carl Alvarez is a 66 yo male with PMH of atrial fib now rhythm controlled with amiodarone and off antiplatelet therapy other than aspirin, hyperlipidemia, hypertension who seen in consultation at the request of Dr. Dan Humphreys for evaluation screening colonoscopy. The patient reports today he is doing well. He was able to stop smoking in February 2013. He is motivated to stop smoking after developing atrial fibrillation. He reports that he has gained 30 pounds since stopping smoking. He's never had screening colonoscopy, but currently denies any symptoms. He denies abdominal pain. No change in bowel habits. No diarrhea or constipation. No blood in his stool or melena. Appetite is good. No heartburn, dysphagia or odynophagia. No known family history of colorectal cancer, and he notes his father had prostate cancer and brother died from complications of diabetes.  Review of Systems  As per history of present illness, otherwise negative   Past Medical History  Diagnosis Date  . Hyperlipidemia   . Arrhythmia     new onset a-fib  . Shoulder pain, bilateral   . Hypernatremia   . Vertigo   . Hypertension     Current Outpatient Prescriptions  Medication Sig Dispense Refill  . amiodarone (PACERONE) 200 MG tablet Take 1 tablet (200 mg total) by mouth daily.  30 tablet  5  . aspirin 81 MG tablet Take 81 mg by mouth 2 (two) times daily.      [name] atorvastatin (LIPITOR) 20 MG tablet Take 1 tablet (20 mg total) by mouth daily.  30 tablet  6  . fluticasone (FLONASE) 50 MCG/ACT nasal spray Place 2 sprays into the nose daily.  16 g  6  . metoprolol tartrate (LOPRESSOR) 25 MG tablet Take 1 tablet (25 mg total) by mouth 2 (two) times daily.  60 tablet  6  . multivitamin-iron-minerals-folic acid (CENTRUM) chewable tablet Chew 1 tablet by mouth daily.      . peg 3350 powder (MOVIPREP) 100 G SOLR Take 1 kit (100 g total) by mouth  once.  1 kit  0    Allergies  Allergen Reactions  . Penicillins     Pt states reaction is where he feels like he is "out of his head"    Family History  Problem Relation Age of Onset  . Hypertension Brother   . Hyperlipidemia Brother   . Heart murmur Brother   . Hypertension Brother   . Hyperlipidemia Brother   . Hyperlipidemia Brother   . Prostate cancer Father   . Diabetes Daughter   . Colon cancer Neg Hx     History  Substance Use Topics  . Smoking status: Former Smoker -- 1.0 packs/day for 45 years    Types: Cigarettes    Quit date: 11/24/2011  . Smokeless tobacco: Never Used  . Alcohol Use: No  --patient is retired from the [employer] system where he worked in Production designer, theatre/television/film, and retired as a Merchandiser, retail  OBJECTIVE: BP 140/70  Pulse 66  Ht 5\' 11"  (1.803 m)  Wt 201 lb 12.8 oz (91.536 kg)  BMI 28.15 kg/m2 Constitutional: Well-developed and well-nourished. No distress. HEENT: Normocephalic and atraumatic. Oropharynx is clear and moist. No oropharyngeal exudate. Conjunctivae are normal. No scleral icterus. Neck: Neck supple. Trachea midline. Cardiovascular: Normal rate, regular rhythm and intact distal pulses. No M/R/G Pulmonary/chest: Effort normal and breath sounds normal. No wheezing, rales or rhonchi. Abdominal: Soft, nontender, nondistended. Bowel sounds  active throughout. There are no masses palpable. No hepatosplenomegaly. Extremities: no clubbing, cyanosis, or edema Lymphadenopathy: No cervical adenopathy noted. Neurological: Alert and oriented to person place and time. Skin: Skin is warm and dry. No rashes noted. Psychiatric: Normal mood and affect. Behavior is normal.  Labs and Imaging -- CBC    Component Value Date/Time   WBC 8.3 01/22/2012 1036   RBC 4.58 01/22/2012 1036   HGB 14.7 01/22/2012 1036   HCT 43.5 01/22/2012 1036   MCV 95 01/22/2012 1036   MCH 32.1 01/22/2012 1036   MCHC 33.8 01/22/2012 1036   RDW 12.7 01/22/2012 1036   LYMPHSABS 3.8  01/22/2012 1036   EOSABS 0.2 01/22/2012 1036   BASOSABS 0.0 01/22/2012 1036    CMP     Component Value Date/Time   NA 141 07/08/2012 0844   NA 142 01/22/2012 1036   K 3.5 07/08/2012 0844   CL 107 07/08/2012 0844   CO2 29 07/08/2012 0844   GLUCOSE 93 07/08/2012 0844   GLUCOSE 86 01/22/2012 1036   BUN 11 07/08/2012 0844   BUN 15 01/22/2012 1036   CREATININE 1.0 07/08/2012 0844   CALCIUM 8.8 07/08/2012 0844   PROT 6.7 07/08/2012 0844   ALBUMIN 3.8 07/08/2012 0844   AST 26 07/08/2012 0844   ALT 30 07/08/2012 0844   ALKPHOS 61 07/08/2012 0844   BILITOT 0.7 07/08/2012 0844   GFRNONAA 64 01/22/2012 1036   GFRAA 74 01/22/2012 1036    ASSESSMENT AND PLAN:  66 yo male with PMH of atrial fib now rhythm controlled with amiodarone and off antiplatelet therapy other than aspirin, hyperlipidemia, hypertension who seen in consultation at the request of Dr. Dan Humphreys for evaluation screening colonoscopy.  1.  CRC screening with hx of a fib -- the patient is average risk for screening colonoscopy, and we discussed the test today including the risks and benefits. He wishes to proceed. His atrial fibrillation is now rate controlled and he is no longer on antiplatelet therapy. He does take aspirin daily, but screening colonoscopy with polypectomy should this be necessary is okay on aspirin. Further recommendations can be made after this test.

## 2012-08-19 NOTE — Patient Instructions (Addendum)
You have been scheduled for a colonoscopy with propofol. Please follow written instructions given to you at your visit today.  Please pick up your prep kit at the pharmacy within the next 1-3 days. If you use inhalers (even only as needed) or a CPAP machine, please bring them with you on the day of your procedure.  cc: Ronna Polio, MD

## 2012-08-20 ENCOUNTER — Ambulatory Visit (AMBULATORY_SURGERY_CENTER): Payer: Medicare Other | Admitting: Internal Medicine

## 2012-08-20 ENCOUNTER — Encounter: Payer: Self-pay | Admitting: Internal Medicine

## 2012-08-20 VITALS — BP 132/69 | HR 49 | Temp 97.7°F | Resp 22 | Ht 71.0 in | Wt 201.0 lb

## 2012-08-20 DIAGNOSIS — Z1211 Encounter for screening for malignant neoplasm of colon: Secondary | ICD-10-CM

## 2012-08-20 DIAGNOSIS — D126 Benign neoplasm of colon, unspecified: Secondary | ICD-10-CM

## 2012-08-20 DIAGNOSIS — K635 Polyp of colon: Secondary | ICD-10-CM

## 2012-08-20 MED ORDER — SODIUM CHLORIDE 0.9 % IV SOLN: 500.0000 mL | INTRAVENOUS | Status: DC

## 2012-08-20 NOTE — Patient Instructions (Addendum)
YOU HAD AN ENDOSCOPIC PROCEDURE TODAY AT THE [facility]: Refer to the procedure report that was given to you for any specific questions about what was found during the examination.  If the procedure report does not answer your questions, please call your gastroenterologist to clarify.  If you requested that your care partner not be given the details of your procedure findings, then the procedure report has been included in a sealed envelope for you to review at your convenience later.  YOU SHOULD EXPECT: Some feelings of bloating in the abdomen. Passage of more gas than usual.  Walking can help get rid of the air that was put into your GI tract during the procedure and reduce the bloating. If you had a lower endoscopy (such as a colonoscopy or flexible sigmoidoscopy) you may notice spotting of blood in your stool or on the toilet paper. If you underwent a bowel prep for your procedure, then you may not have a normal bowel movement for a few days.  DIET: Your first meal following the procedure should be a light meal and then it is ok to progress to your normal diet.  A half-sandwich or bowl of soup is an example of a good first meal.  Heavy or fried foods are harder to digest and may make you feel nauseous or bloated.  Likewise meals heavy in dairy and vegetables can cause extra gas to form and this can also increase the bloating.  Drink plenty of fluids but you should avoid alcoholic beverages for 24 hours.  ACTIVITY: Your care partner should take you home directly after the procedure.  You should plan to take it easy, moving slowly for the rest of the day.  You can resume normal activity the day after the procedure however you should NOT DRIVE or use heavy machinery for 24 hours (because of the sedation medicines used during the test).    SYMPTOMS TO REPORT IMMEDIATELY: A gastroenterologist can be reached at any hour.  During normal business hours, 8:30 AM to 5:00 PM Monday through Friday,  call [phone].  After hours and on weekends, please call the GI answering service at [phone] who will take a message and have the physician on call contact you.   Following lower endoscopy (colonoscopy or flexible sigmoidoscopy):  Excessive amounts of blood in the stool  Significant tenderness or worsening of abdominal pains  Swelling of the abdomen that is new, acute  Fever of 100F or higher  Following upper endoscopy (EGD)  Vomiting of blood or coffee ground material  New chest pain or pain under the shoulder blades  Painful or persistently difficult swallowing  New shortness of breath  Fever of 100F or higher  Black, tarry-looking stools  FOLLOW UP: If any biopsies were taken you will be contacted by phone or by letter within the next 1-3 weeks.  Call your gastroenterologist if you have not heard about the biopsies in 3 weeks.  Our staff will call the home number listed on your records the next business day following your procedure to check on you and address any questions or concerns that you may have at that time regarding the information given to you following your procedure. This is a courtesy call and so if there is no answer at the home number and we have not heard from you through the emergency physician on call, we will assume that you have returned to your regular daily activities without incident.  SIGNATURES/CONFIDENTIALITY: You and/or your care   partner have signed paperwork which will be entered into your electronic medical record.  These signatures attest to the fact that that the information above on your After Visit Summary has been reviewed and is understood.  Full responsibility of the confidentiality of this discharge information lies with you and/or your care-partner.   Polyp, diverticulosis, high fiber diet information given. 

## 2012-08-20 NOTE — Progress Notes (Signed)
Patient did not experience any of the following events: a burn prior to discharge; a fall within the facility; wrong site/side/patient/procedure/implant event; or a hospital transfer or hospital admission upon discharge from the facility. (G8907) Patient did not have preoperative order for IV antibiotic SSI prophylaxis. (G8918)  

## 2012-08-20 NOTE — Progress Notes (Signed)
1111 a/o x3 pleased report to RN

## 2012-08-20 NOTE — Op Note (Signed)
[facility] [address] [place], [address]   COLONOSCOPY PROCEDURE REPORT  PATIENT: Carl Alvarez, Carl Alvarez  MR#: [id number] BIRTHDATE: [birth year] , 66  yrs. old GENDER: Male ENDOSCOPIST: Beverley Fiedler, MD REFERRED [id number], Jennifer PROCEDURE DATE:  08/20/2012 PROCEDURE:   Colonoscopy with snare polypectomy ASA CLASS:   Class III INDICATIONS:average risk screening and first colonoscopy. MEDICATIONS: MAC sedation, administered by CRNA and Propofol (Diprivan) 260 mg IV  DESCRIPTION OF PROCEDURE:   After the risks benefits and alternatives of the procedure were thoroughly explained, informed consent was obtained.  A digital rectal exam revealed no rectal mass.   The LB CF-[id number]  endoscope was introduced through the anus and advanced to the cecum, which was identified by both the appendix and ileocecal valve. No adverse events experienced. The quality of the prep was Moviprep fair  The instrument was then slowly withdrawn as the colon was fully examined.     COLON FINDINGS: A sessile polyp measuring 4 mm in size was found in the ascending colon.  A polypectomy was performed with a cold snare.  The resection was complete and the polyp tissue was completely retrieved.   A sessile polyp measuring 3 mm in size was found in the sigmoid colon.  A polypectomy was performed with a cold snare.  The resection was complete and the polyp tissue was completely retrieved.   Severe diverticulosis was noted in the left colon.  Retroflexed views revealed small internal hemorrhoids. The time to cecum=3 minutes 29 seconds.  Withdrawal time=14 minutes 38 seconds.  The scope was withdrawn and the procedure completed.  COMPLICATIONS: There were no complications.  ENDOSCOPIC IMPRESSION: 1.   Sessile polyp measuring 4 mm in size was found in the ascending colon; polypectomy was performed with a cold snare 2.   Sessile polyp measuring 3 mm in size was found in the sigmoid colon;  polypectomy was performed with a cold snare 3.   Severe diverticulosis was noted in the left colon  RECOMMENDATIONS: 1.  High fiber diet 2.  If the polyp(s) removed today are proven to be adenomatous (pre-cancerous) polyps, you will need a repeat colonoscopy in 5 years.  Otherwise you should continue to follow colorectal cancer screening guidelines for "routine risk" patients with colonoscopy in 10 years.  You will receive a letter within 1-2 weeks with the results of your biopsy as well as final recommendations.  Please call my office if you have not received a letter after 3 weeks.   eSigned:  Beverley Fiedler, MD 08/20/2012 11:08 AM   cc: Ronna Polio MD and The Patient

## 2012-08-21 ENCOUNTER — Telehealth: Payer: Self-pay | Admitting: *Deleted

## 2012-08-21 NOTE — Telephone Encounter (Signed)
  Follow up Call-  Call back number 08/20/2012  Post procedure Call Back phone  # [phone] cell  Permission to leave phone message Yes     Patient questions:  Do you have a fever, pain , or abdominal swelling? no Pain Score  0 *  Have you tolerated food without any problems? yes  Have you been able to return to your normal activities? yes  Do you have any questions about your discharge instructions: Diet   no Medications  no Follow up visit  no  Do you have questions or concerns about your Care? no  Actions: * If pain score is 4 or above: No action needed, pain <4. Pt. Stated we were very professional.

## 2012-08-22 ENCOUNTER — Telehealth: Payer: Self-pay | Admitting: Internal Medicine

## 2012-08-22 DIAGNOSIS — J309 Allergic rhinitis, unspecified: Secondary | ICD-10-CM

## 2012-08-22 NOTE — Telephone Encounter (Signed)
I have placed a referral

## 2012-08-22 NOTE — Telephone Encounter (Signed)
Pt wanted to get a referral for an allergy dr Please advise

## 2012-08-27 ENCOUNTER — Encounter: Payer: Self-pay | Admitting: Internal Medicine

## 2012-09-02 ENCOUNTER — Telehealth: Payer: Self-pay | Admitting: Internal Medicine

## 2012-09-02 NOTE — Telephone Encounter (Signed)
08/20/12 COLON with numerous polyps, severe diverticulosis in left colon. Pt reports he's been having a stinging pain on the right side at his rib cage. He reports he's having normal BM's; wants to know if it could have anything to do with his procedure. 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 He states he could have pulled a muscle twisting in a car while vacuuming; he's not sure. We  talked for several minutes and he finally decided he would watch it and wait and call back if no better.

## 2012-10-10 ENCOUNTER — Other Ambulatory Visit: Payer: Self-pay

## 2012-10-10 MED ORDER — METOPROLOL TARTRATE 25 MG PO TABS: 25.0000 mg | ORAL_TABLET | Freq: Two times a day (BID) | ORAL | Status: DC

## 2012-10-10 NOTE — Telephone Encounter (Signed)
Refill sent for metoprolol tart 25 mg take one tablet bid.

## 2012-10-31 ENCOUNTER — Ambulatory Visit: Payer: Medicare Other | Admitting: Internal Medicine

## 2012-10-31 ENCOUNTER — Ambulatory Visit (INDEPENDENT_AMBULATORY_CARE_PROVIDER_SITE_OTHER): Payer: Self-pay | Admitting: Adult Health

## 2012-10-31 ENCOUNTER — Encounter: Payer: Self-pay | Admitting: Adult Health

## 2012-10-31 ENCOUNTER — Ambulatory Visit: Payer: Medicare Other | Admitting: Adult Health

## 2012-10-31 VITALS — BP 140/78 | HR 72 | Temp 98.3°F | Resp 14 | Ht 71.0 in | Wt 203.5 lb

## 2012-10-31 DIAGNOSIS — I10 Essential (primary) hypertension: Secondary | ICD-10-CM

## 2012-10-31 MED ORDER — METOPROLOL TARTRATE 25 MG PO TABS: 25.0000 mg | ORAL_TABLET | Freq: Two times a day (BID) | ORAL | Status: DC

## 2012-10-31 NOTE — Patient Instructions (Addendum)
  Your blood pressure medication (metoprolol) is to be taken 2 times daily.  Check your blood pressure in a few days and call if it is still elevated.  I have called in a refill for your medication.  We will schedule a f/u with Dr. Mariah Milling for you for April 2014 .

## 2012-10-31 NOTE — Progress Notes (Signed)
  Subjective:    Patient ID: Carl Alvarez, male    DOB: May 11, 1946, 67 y.o.   MRN: [id number]  HPI  Carl Alvarez is a very pleasant 67 y/o male with hx of afib, HTN, HLD who presents to clinic this morning with reports that his B/P has been running high. He is currently taking metoprolol for HTN. Patient reports that he is only taking this medication once daily. He also reports hx of feeling lightheaded. This has occurred a couple of times.  Current Outpatient Prescriptions on File Prior to Visit  Medication Sig Dispense Refill  . amiodarone (PACERONE) 200 MG tablet Take 1 tablet (200 mg total) by mouth daily.  30 tablet  5  . aspirin 81 MG tablet Take 81 mg by mouth 2 (two) times daily.      [name] atorvastatin (LIPITOR) 20 MG tablet Take 1 tablet (20 mg total) by mouth daily.  30 tablet  6  . fluticasone (FLONASE) 50 MCG/ACT nasal spray Place 2 sprays into the nose daily.  16 g  6  . ibuprofen (ADVIL,MOTRIN) 200 MG tablet Take 200 mg by mouth every 6 (six) hours as needed.      . meclizine (ANTIVERT) 25 MG tablet as needed.      . metoprolol tartrate (LOPRESSOR) 25 MG tablet Take 1 tablet (25 mg total) by mouth 2 (two) times daily.  60 tablet  6     Review of Systems  Constitutional: Negative.   HENT: Negative.   Respiratory: Negative.   Cardiovascular: Negative.   Gastrointestinal: Negative.   Neurological: Positive for light-headedness. Negative for dizziness and headaches.  Psychiatric/Behavioral: Negative.    BP 140/78  Pulse 72  Temp 98.3 F (36.8 C) (Oral)  Resp 14  Ht 5\' 11"  (1.803 m)  Wt 203 lb 8 oz (92.307 kg)  BMI 28.38 kg/m2     Objective:   Physical Exam  Constitutional: He is oriented to person, place, and time. He appears well-developed and well-nourished. No distress.  HENT:  Head: Normocephalic and atraumatic.  Neck: Normal range of motion. Neck supple. No tracheal deviation present.  Cardiovascular: Normal rate, regular rhythm and normal heart sounds.     Pulmonary/Chest: Effort normal and breath sounds normal. No respiratory distress. He has no wheezes. He has no rales.  Neurological: He is alert and oriented to person, place, and time.  Skin: Skin is warm and dry. No rash noted.  Psychiatric: He has a normal mood and affect. His behavior is normal. Judgment and thought content normal.          Assessment & Plan:

## 2012-10-31 NOTE — Assessment & Plan Note (Addendum)
Patient has only been taking his metoprolol once daily. Explained this is to be taken twice daily. Start bid and recheck B/P in a couple of days. Call with readings in approximately 1 week. Refill sent to pharmacy.

## 2012-11-04 ENCOUNTER — Telehealth: Payer: Self-pay | Admitting: Internal Medicine

## 2012-11-04 NOTE — Telephone Encounter (Signed)
Still need to clarify. Is he concerned about his BP? Or, does he need a refill?

## 2012-11-04 NOTE — Telephone Encounter (Signed)
Patient was taking his blood pressure pills 1 twice a day . His heart doctor wanted him to start taking his BP med's 1 1/2 tablets twice a day .   Thursday his BP before he took his medication was 147/91 heart rate 81 1 hour later his BP was 147/91 heart rate 79  Friday Am before taking his medication his BP was 137/90 heart rate 59 1 hr later 127/82 heart rate 58  Friday night Before medication 131/83 heart rate 76  1 hour later 120/87 heart rate 80  Saturday before medication 124/80 heart rate 64  1 hour later 124/82 heart rate 59  Saturday night 139/89 heart rate 76  1 hour later 128/78 heart rate 74

## 2012-11-04 NOTE — Telephone Encounter (Signed)
Left message on cell phone voicemail for patient to return call. 

## 2012-11-04 NOTE — Telephone Encounter (Signed)
Patient returned called and clarified that he is taking one and one half pill bid and not 2 pills bid

## 2012-11-04 NOTE — Telephone Encounter (Signed)
I spoke with patient via telephone, he stated that he was a little concerned with his BP readings but I advised him that those reading look fine.  He stated that he will continue to monitor it and call back to schedule a f/u appt.

## 2012-11-04 NOTE — Telephone Encounter (Signed)
The BP readings look fine. I am not sure if he has a question based on the note?? Can you clarify?

## 2012-12-31 ENCOUNTER — Telehealth: Payer: Self-pay | Admitting: *Deleted

## 2012-12-31 MED ORDER — ATORVASTATIN CALCIUM 20 MG PO TABS: 20.0000 mg | ORAL_TABLET | Freq: Every day | ORAL | Status: DC

## 2013-01-03 ENCOUNTER — Other Ambulatory Visit: Payer: Self-pay

## 2013-01-03 MED ORDER — AMIODARONE HCL 200 MG PO TABS: 200.0000 mg | ORAL_TABLET | Freq: Every day | ORAL | Status: DC

## 2013-01-03 NOTE — Telephone Encounter (Signed)
Refill sent amiodarone hcl 200 mg

## 2013-01-03 NOTE — Telephone Encounter (Signed)
error 

## 2013-01-06 ENCOUNTER — Ambulatory Visit: Payer: Medicare Other | Admitting: Internal Medicine

## 2013-01-29 ENCOUNTER — Ambulatory Visit (INDEPENDENT_AMBULATORY_CARE_PROVIDER_SITE_OTHER): Payer: Medicare Other | Admitting: Cardiovascular Disease

## 2013-01-29 ENCOUNTER — Encounter: Payer: Self-pay | Admitting: Cardiovascular Disease

## 2013-01-29 VITALS — BP 162/78 | HR 62 | Ht 71.0 in | Wt 206.8 lb

## 2013-01-29 DIAGNOSIS — R0602 Shortness of breath: Secondary | ICD-10-CM

## 2013-01-29 DIAGNOSIS — I4891 Unspecified atrial fibrillation: Secondary | ICD-10-CM

## 2013-01-29 DIAGNOSIS — F329 Major depressive disorder, single episode, unspecified: Secondary | ICD-10-CM

## 2013-01-29 DIAGNOSIS — I10 Essential (primary) hypertension: Secondary | ICD-10-CM

## 2013-01-29 DIAGNOSIS — F32A Depression, unspecified: Secondary | ICD-10-CM

## 2013-01-29 DIAGNOSIS — E785 Hyperlipidemia, unspecified: Secondary | ICD-10-CM

## 2013-01-29 MED ORDER — ATORVASTATIN CALCIUM 20 MG PO TABS: 20.0000 mg | ORAL_TABLET | Freq: Every day | ORAL | Status: DC

## 2013-01-29 MED ORDER — LISINOPRIL 20 MG PO TABS: 20.0000 mg | ORAL_TABLET | Freq: Every day | ORAL | Status: DC

## 2013-01-29 NOTE — Assessment & Plan Note (Signed)
Does not seem to have had any additional episodes of arrhythmia. We'll continue his current medications

## 2013-01-29 NOTE — Progress Notes (Signed)
Patient ID: Carl Alvarez, male    DOB: 02-Jul-1946, 67 y.o.   MRN: [id number]  HPI Comments:  Carl Alvarez is a pleasant 67 yo gentleman with long history of smoking, hypertension, hyperlipidemia who presented to [facility] on November 22 2011 with right upper extremity tingling and right facial numbness, felt to have a possible TIA, found to be in atrial fibrillation.  He was started on low-dose beta blocker, pradaxa 150 mg b.i.d., DC cardioversion early April 2013 He  stopped smoking since he left the hospital CT Scan of the head did not show any significant stroke Chest x-ray was clear apart from hyperinflation consistent with COPD  Echocardiogram November 22, 2011 shows ejection fraction 50-55%, otherwise normal study  Followup EKG showed atrial flutter. He was started on amiodarone in an attempt to cardiovert pharmacologically. He remained in atrial flutter and underwent DC cardioversion early April 2013 which was successful.   he reports that he feels well with no shortness of breath, no fluttering or other symptoms.  His wife continues to smoke, he has not smoked since earlier this year. Overall he has no complaints. He does not check his blood pressure at home. Denies any long periods of palpitations or tachycardia. Some sleep disturbance.  EKG today normal sinus rhythm with rate 62 beats per minute, no significant ST-T wave changes    Outpatient Encounter Prescriptions as of 01/29/2013  Medication Sig Dispense Refill  . amiodarone (PACERONE) 200 MG tablet Take 1 tablet (200 mg total) by mouth daily.  30 tablet  5  . aspirin 81 MG tablet Take 81 mg by mouth 2 (two) times daily.      [name] atorvastatin (LIPITOR) 20 MG tablet Take 1 tablet (20 mg total) by mouth daily.  30 tablet  11  . fluticasone (FLONASE) 50 MCG/ACT nasal spray Place 2 sprays into the nose daily.  16 g  6  . ibuprofen (ADVIL,MOTRIN) 200 MG tablet Take 200 mg by mouth every 6 (six) hours as needed.      . meclizine (ANTIVERT) 25 MG  tablet as needed.      . metoprolol tartrate (LOPRESSOR) 25 MG tablet Take 1 tablet (25 mg total) by mouth 2 (two) times daily.  60 tablet  6  . Multiple Vitamin (MULTIVITAMIN) tablet Take 1 tablet by mouth daily.        Review of Systems  Constitutional: Negative.   HENT: Negative.   Eyes: Negative.   Respiratory: Negative.   Cardiovascular: Negative.   Gastrointestinal: Negative.   Musculoskeletal: Positive for joint swelling and arthralgias.  Skin: Negative.   Neurological: Negative.   Psychiatric/Behavioral: Positive for sleep disturbance.  All other systems reviewed and are negative.    BP 162/78  Pulse 62  Ht 5\' 11"  (1.803 m)  Wt 206 lb 12 oz (93.781 kg)  BMI 28.85 kg/m2  Physical Exam  Nursing note and vitals reviewed. Constitutional: He is oriented to person, place, and time. He appears well-developed and well-nourished.  HENT:  Head: Normocephalic.  Nose: Nose normal.  Mouth/Throat: Oropharynx is clear and moist.  Eyes: Conjunctivae are normal. Pupils are equal, round, and reactive to light.  Neck: Normal range of motion. Neck supple. No JVD present.  Cardiovascular: Normal rate, regular rhythm, S1 normal, S2 normal, normal heart sounds and intact distal pulses.  Exam reveals no gallop and no friction rub.   No murmur heard. Pulmonary/Chest: Effort normal and breath sounds normal. No respiratory distress. He has no wheezes. He has no rales.  He exhibits no tenderness.  Abdominal: Soft. Bowel sounds are normal. He exhibits no distension. There is no tenderness.  Musculoskeletal: Normal range of motion. He exhibits no edema and no tenderness.  Lymphadenopathy:    He has no cervical adenopathy.  Neurological: He is alert and oriented to person, place, and time. Coordination normal.  Skin: Skin is warm and dry. No rash noted. No erythema.  Psychiatric: He has a normal mood and affect. His behavior is normal. Judgment and thought content normal.      Assessment and  Plan

## 2013-01-29 NOTE — Patient Instructions (Addendum)
You are doing well. Please start lisinopril one a day for blood pressure Take a 1/2 if you feel woozy in the head for a few weeks  Please call us if you have new issues that need to be addressed before your next appt.  Your physician wants you to follow-up in: 6 months.  You will receive a reminder letter in the mail two months in advance. If you don't receive a letter, please call our office to schedule the follow-up appointment.

## 2013-01-29 NOTE — Assessment & Plan Note (Addendum)
Blood pressure is high today. We will start him on lisinopril 20 mg daily. We have asked him to monitor his blood pressure at home. He states that he is due for followup with Dr. Dan Humphreys and we'll schedule this. Perhaps she can have routine lab work done at that time.

## 2013-01-29 NOTE — Assessment & Plan Note (Signed)
We have suggested he stay on his generic Lipitor

## 2013-03-19 ENCOUNTER — Other Ambulatory Visit: Payer: Self-pay | Admitting: *Deleted

## 2013-03-19 MED ORDER — METOPROLOL TARTRATE 25 MG PO TABS: ORAL_TABLET | ORAL | Status: DC

## 2013-03-19 NOTE — Telephone Encounter (Signed)
Refilled Metoprolol sent to [facility] pharmacy. Pt mentioned that he takes 1 and 1/2 tablet bid daily. #90 Refill#3.

## 2013-05-08 ENCOUNTER — Other Ambulatory Visit: Payer: Self-pay | Admitting: *Deleted

## 2013-06-30 ENCOUNTER — Other Ambulatory Visit: Payer: Self-pay

## 2013-06-30 MED ORDER — AMIODARONE HCL 200 MG PO TABS: 200.0000 mg | ORAL_TABLET | Freq: Every day | ORAL | Status: DC

## 2013-06-30 NOTE — Telephone Encounter (Signed)
Refill sent for amiodarone 

## 2013-07-04 ENCOUNTER — Ambulatory Visit: Payer: BC Managed Care – PPO | Admitting: Adult Health

## 2013-07-21 ENCOUNTER — Other Ambulatory Visit: Payer: Self-pay | Admitting: *Deleted

## 2013-07-21 MED ORDER — METOPROLOL TARTRATE 25 MG PO TABS: ORAL_TABLET | ORAL | Status: DC

## 2013-07-21 NOTE — Telephone Encounter (Signed)
Refilled Metoprolol sent to [facility] pharmacy.

## 2013-08-12 ENCOUNTER — Ambulatory Visit (INDEPENDENT_AMBULATORY_CARE_PROVIDER_SITE_OTHER): Payer: Medicare Other | Admitting: Cardiovascular Disease

## 2013-08-12 ENCOUNTER — Encounter: Payer: Self-pay | Admitting: Cardiovascular Disease

## 2013-08-12 VITALS — BP 150/82 | HR 57 | Ht 71.0 in | Wt 189.8 lb

## 2013-08-12 DIAGNOSIS — F17200 Nicotine dependence, unspecified, uncomplicated: Secondary | ICD-10-CM | POA: Insufficient documentation

## 2013-08-12 DIAGNOSIS — Z23 Encounter for immunization: Secondary | ICD-10-CM

## 2013-08-12 DIAGNOSIS — I4891 Unspecified atrial fibrillation: Secondary | ICD-10-CM

## 2013-08-12 DIAGNOSIS — I10 Essential (primary) hypertension: Secondary | ICD-10-CM

## 2013-08-12 DIAGNOSIS — Z0000 Encounter for general adult medical examination without abnormal findings: Secondary | ICD-10-CM

## 2013-08-12 DIAGNOSIS — E785 Hyperlipidemia, unspecified: Secondary | ICD-10-CM

## 2013-08-12 NOTE — Assessment & Plan Note (Signed)
He is smoking again, one half pack per day. We have encouraged him to continue to work on weaning his cigarettes and smoking cessation. He will continue to work on this and does not want any assistance with chantix.

## 2013-08-12 NOTE — Patient Instructions (Addendum)
You are doing well. No medication changes were made.  Please try to quit smoking  Try aleve (naproxen) for your knee  To protect your stomach, you can start omeprazole (over the counter) one a day  Please call us if you have new issues that need to be addressed before your next appt.  Your physician wants you to follow-up in: 12 months.  You will receive a reminder letter in the mail two months in advance. If you don't receive a letter, please call our office to schedule the follow-up appointment.

## 2013-08-12 NOTE — Progress Notes (Signed)
Patient ID: Carl Alvarez, male    DOB: 08-16-1946, 67 y.o.   MRN: [id number]  HPI Comments:  Carl Alvarez is a pleasant 67 yo gentleman with long history of smoking, hypertension, hyperlipidemia who presented to [facility] on November 22 2011 with right upper extremity tingling and right facial numbness, felt to have a possible TIA, found to be in atrial fibrillation.  He was started on low-dose beta blocker, pradaxa 150 mg b.i.d., DC cardioversion early April 2013 He  stopped smoking since he left the hospital, though reports he has recently restarted smoking again. CT Scan of the head did not show any significant stroke Chest x-ray was clear apart from hyperinflation consistent with COPD  Echocardiogram November 22, 2011 shows ejection fraction 50-55%, otherwise normal study  Followup EKG showed atrial flutter. He was started on amiodarone in an attempt to cardiovert pharmacologically. He remained in atrial flutter and underwent DC cardioversion early April 2013 which was successful.   he reports that he feels well with no shortness of breath, no fluttering or other symptoms.  He reports that he has had significant stress at home. He and his wife have separated. He is now smoking again. He thinks he will retire in 2015. He reports that he has been losing weight, watching what he eats Overall he has no complaints. He does not check his blood pressure at home. Denies any long periods of palpitations or tachycardia.   EKG today normal sinus rhythm with rate 57 beats per minute , no significant ST-T wave changes    Outpatient Encounter Prescriptions as of 08/12/2013  Medication Sig Dispense Refill  . amiodarone (PACERONE) 200 MG tablet Take 1 tablet (200 mg total) by mouth daily.  30 tablet  5  . aspirin 81 MG tablet Take 81 mg by mouth 2 (two) times daily.      [name] atorvastatin (LIPITOR) 20 MG tablet Take 1 tablet (20 mg total) by mouth daily.  30 tablet  11  . fluticasone (FLONASE) 50 MCG/ACT nasal  spray Place 2 sprays into the nose daily.  16 g  6  . ibuprofen (ADVIL,MOTRIN) 200 MG tablet Take 200 mg by mouth every 6 (six) hours as needed.      [name] lisinopril (PRINIVIL,ZESTRIL) 20 MG tablet Take 1 tablet (20 mg total) by mouth daily.  30 tablet  6  . meclizine (ANTIVERT) 25 MG tablet as needed.      . metoprolol tartrate (LOPRESSOR) 25 MG tablet Take one and half tablet twice daily.  90 tablet  3  . Multiple Vitamin (MULTIVITAMIN) tablet Take 1 tablet by mouth daily.       No facility-administered encounter medications on file as of 08/12/2013.    Review of Systems  Constitutional: Negative.   HENT: Negative.   Eyes: Negative.   Respiratory: Negative.   Cardiovascular: Negative.   Gastrointestinal: Negative.   Endocrine: Negative.   Musculoskeletal: Positive for arthralgias and joint swelling.  Skin: Negative.   Allergic/Immunologic: Negative.   Neurological: Negative.   Hematological: Negative.   Psychiatric/Behavioral: Positive for sleep disturbance.  All other systems reviewed and are negative.    BP 150/82  Pulse 57  Ht 5\' 11"  (1.803 m)  Wt 189 lb 12 oz (86.07 kg)  BMI 26.48 kg/m2  Physical Exam  Nursing note and vitals reviewed. Constitutional: He is oriented to person, place, and time. He appears well-developed and well-nourished.  HENT:  Head: Normocephalic.  Nose: Nose normal.  Mouth/Throat: Oropharynx is clear and moist.  Eyes: Conjunctivae are normal. Pupils are equal, round, and reactive to light.  Neck: Normal range of motion. Neck supple. No JVD present.  Cardiovascular: Normal rate, regular rhythm, S1 normal, S2 normal, normal heart sounds and intact distal pulses.  Exam reveals no gallop and no friction rub.   No murmur heard. Pulmonary/Chest: Effort normal and breath sounds normal. No respiratory distress. He has no wheezes. He has no rales. He exhibits no tenderness.  Abdominal: Soft. Bowel sounds are normal. He exhibits no distension. There is no  tenderness.  Musculoskeletal: Normal range of motion. He exhibits no edema and no tenderness.  Lymphadenopathy:    He has no cervical adenopathy.  Neurological: He is alert and oriented to person, place, and time. Coordination normal.  Skin: Skin is warm and dry. No rash noted. No erythema.  Psychiatric: He has a normal mood and affect. His behavior is normal. Judgment and thought content normal.      Assessment and Plan

## 2013-08-12 NOTE — Assessment & Plan Note (Signed)
He is maintaining normal sinus rhythm. No changes made to his medications

## 2013-08-12 NOTE — Assessment & Plan Note (Signed)
Blood pressure is well controlled on today's visit. No changes made to the medications. 

## 2013-08-12 NOTE — Assessment & Plan Note (Signed)
We have encouraged him to stay on his Lipitor 

## 2013-09-08 ENCOUNTER — Telehealth: Payer: Self-pay | Admitting: Internal Medicine

## 2013-09-08 DIAGNOSIS — M25561 Pain in right knee: Secondary | ICD-10-CM | POA: Insufficient documentation

## 2013-09-08 DIAGNOSIS — M7060 Trochanteric bursitis, unspecified hip: Secondary | ICD-10-CM | POA: Insufficient documentation

## 2013-09-08 NOTE — Telephone Encounter (Signed)
Patient stopped by the office needing surgery clearance paperwork filled out. You haven't seen the patient since 06/2012 and he needs this done by tomorrow in order to be able to have surgery on 10/06/13.  He has seen Dr. Rockey Situ last week so he is going to ask him to fill them out and call us back if he can't.

## 2013-09-08 NOTE — Telephone Encounter (Signed)
FYI to Dr. Walker 

## 2013-09-16 ENCOUNTER — Other Ambulatory Visit: Payer: Self-pay | Admitting: Orthopedic Surgery

## 2013-09-25 ENCOUNTER — Encounter (HOSPITAL_COMMUNITY): Payer: Self-pay | Admitting: Pharmacy Technician

## 2013-09-25 NOTE — Pre-Procedure Instructions (Addendum)
Carl Alvarez  09/25/2013   Your procedure is scheduled on:  Monday, December 15th.  Report to [facility], Main Entrance / Entrance "A" at 7:55 AM.  Call this number if you have problems the morning of surgery: [phone]   Remember:   Do not eat food or drink liquids after midnight.   Take these medicines the morning of surgery with A SIP OF WATER: Amiodarone, Metoprolol.  May use Flonase.  Take if needed: Antivert.           STOP all herbel meds, nsaids (aleve,naproxen,advil,ibuprofen) 5 days prior to surgery including aspirin, vitamins   Do not wear jewelry, make-up or nail polish.  Do not wear lotions, powders, or perfumes. You may wear deodorant.  Do not shave 48 hours prior to surgery. Men may shave face and neck.  Do not bring valuables to the hospital.  [facility] is not responsible                  for any belongings or valuables.               Contacts, dentures or bridgework may not be worn into surgery.  Leave suitcase in the car. After surgery it may be brought to your room.  For patients admitted to the hospital, discharge time is determined by your treatment team.                 Special Instructions: Shower using CHG 2 nights before surgery and the night before surgery.  If you shower the day of surgery use CHG.  Use special wash - you have one bottle of CHG for all showers.  You should use approximately 1/3 of the bottle for each shower.   Please read over the following fact sheets that you were given: Pain Booklet, Coughing and Deep Breathing, Blood Transfusion Information and Surgical Site Infection Prevention

## 2013-09-26 ENCOUNTER — Encounter (HOSPITAL_COMMUNITY)
Admission: RE | Admit: 2013-09-26 | Discharge: 2013-09-26 | Disposition: A | Discharge status: Home / Self Care | Payer: Medicare Other | Source: Ambulatory Visit | Attending: Orthopedic Surgery | Admitting: Orthopedic Surgery

## 2013-09-26 ENCOUNTER — Encounter (HOSPITAL_COMMUNITY): Payer: Self-pay

## 2013-09-26 DIAGNOSIS — Z01818 Encounter for other preprocedural examination: Secondary | ICD-10-CM | POA: Insufficient documentation

## 2013-09-26 DIAGNOSIS — Z01812 Encounter for preprocedural laboratory examination: Secondary | ICD-10-CM | POA: Insufficient documentation

## 2013-09-26 LAB — COMPREHENSIVE METABOLIC PANEL
ALT: 23 U/L (ref 0–53)
AST: 22 U/L (ref 0–37)
Albumin: 3.6 g/dL (ref 3.5–5.2)
Alkaline Phosphatase: 74 U/L (ref 39–117)
BUN: 19 mg/dL (ref 6–23)
CO2: 28 mEq/L (ref 19–32)
Calcium: 9.1 mg/dL (ref 8.4–10.5)
Chloride: 106 mEq/L (ref 96–112)
Creatinine, Ser: 1.2 mg/dL (ref 0.50–1.35)
GFR calc Af Amer: 70 mL/min — ABNORMAL LOW (ref >90)
GFR calc non Af Amer: 60 mL/min — ABNORMAL LOW (ref >90)
Glucose, Bld: 97 mg/dL (ref 70–99)
Potassium: 4.4 mEq/L (ref 3.5–5.1)
Sodium: 141 mEq/L (ref 135–145)
Total Bilirubin: 0.2 mg/dL — ABNORMAL LOW (ref 0.3–1.2)
Total Protein: 6.8 g/dL (ref 6.0–8.3)

## 2013-09-26 LAB — URINALYSIS, ROUTINE W REFLEX MICROSCOPIC
Bilirubin Urine: NEGATIVE
Glucose, UA: NEGATIVE mg/dL
Hgb urine dipstick: NEGATIVE
Ketones, ur: NEGATIVE mg/dL
Leukocytes, UA: NEGATIVE
Nitrite: NEGATIVE
Protein, ur: NEGATIVE mg/dL
Specific Gravity, Urine: 1.028 (ref 1.005–1.030)
Urobilinogen, UA: 1 mg/dL (ref 0.0–1.0)
pH: 5.5 (ref 5.0–8.0)

## 2013-09-26 LAB — CBC WITH DIFFERENTIAL/PLATELET
Basophils Absolute: 0 10*3/uL (ref 0.0–0.1)
Basophils Relative: 0 % (ref 0–1)
Eosinophils Absolute: 0.2 10*3/uL (ref 0.0–0.7)
Eosinophils Relative: 3 % (ref 0–5)
HCT: 42.6 % (ref 39.0–52.0)
Hemoglobin: 14.5 g/dL (ref 13.0–17.0)
Lymphocytes Relative: 35 % (ref 12–46)
Lymphs Abs: 3.3 10*3/uL (ref 0.7–4.0)
MCH: 32.6 pg (ref 26.0–34.0)
MCHC: 34 g/dL (ref 30.0–36.0)
MCV: 95.7 fL (ref 78.0–100.0)
Monocytes Absolute: 0.6 10*3/uL (ref 0.1–1.0)
Monocytes Relative: 6 % (ref 3–12)
Neutro Abs: 5.2 10*3/uL (ref 1.7–7.7)
Neutrophils Relative %: 56 % (ref 43–77)
Platelets: 160 10*3/uL (ref 150–400)
RBC: 4.45 MIL/uL (ref 4.22–5.81)
RDW: 13.4 % (ref 11.5–15.5)
WBC: 9.3 10*3/uL (ref 4.0–10.5)

## 2013-09-26 LAB — SURGICAL PCR SCREEN
MRSA, PCR: NEGATIVE
Staphylococcus aureus: NEGATIVE

## 2013-09-26 LAB — APTT: aPTT: 25 seconds (ref 24–37)

## 2013-09-26 LAB — TYPE AND SCREEN
ABO/RH(D): A POS
Antibody Screen: NEGATIVE

## 2013-09-26 LAB — PROTIME-INR
INR: 1.01 (ref 0.00–1.49)
Prothrombin Time: 13.1 seconds (ref 11.6–15.2)

## 2013-09-26 LAB — ABO/RH: ABO/RH(D): A POS

## 2013-09-26 NOTE — Progress Notes (Signed)
req'd clearance note from [place] dr Mariah Milling [place]

## 2013-09-29 ENCOUNTER — Encounter (HOSPITAL_COMMUNITY): Payer: Self-pay

## 2013-09-29 NOTE — Progress Notes (Addendum)
Anesthesia Chart Review:  Patient is a 67 year old male scheduled for right TKA on 10/06/13 by Dr. Sherlean Foot.  History includes smoking, HLD, GERD (denies), HTN, murmur (not specified), vertigo, afib/aflutter 10/2011 s/p cardioversion 01/2012. PCP is Dr. Ronna Polio.  Cardiologist is Dr. Julien Nordmann, last visit was 08/12/13.  EKG on 08/12/13 showed SB @ 57 bpm.  By notes, 1) Echo on 11/22/11 showed EF 50-55%, otherwise normal study, 2) carotid dopplers in 11/22/11 [facility]) showed no flow limiting stenosis. (Reports requested from [facility].)  CXR on 09/26/13 showed: There is hyperinflation which may be voluntary or could reflect underlying COPD. There is no evidence of pneumonia nor CHF.   Preoperative labs noted.  UA WNL. Urine culture was not done, so this will need to be done on the day of surgery if still wanted by the surgeon.  Patient has a history of afib, but now maintaining SR following cardioversion.  He remains on amiodarone and metoprolol.  He continued to smoke, but otherwise appeared to be doing well from a cardiac standpoint at his last visit in 07/2013.  If no acute changes then I would anticipate that he could proceed as planned.  Notes in Epic indicate that Dr. Mariah Milling was contacted for surgical clearance.  Message left with Keri at Dr. Tobin Chad office to fax once vailable.   Velna Ochs [facility] Short Stay Center/Anesthesiology Phone [phone] 09/29/2013 5:05 PM  Addendum: 09/30/2013 10:35 AM Received clearance note from Dr. Mariah Milling and carotid duplex and echocardiogram from [facility].  Echo on 11/22/11 showed LV systolic function low normal with EF 50-55%, mild concentric LVH, normal RV systolic function, normal LA size, normal RVSP, no evidence of ASD, mild MR/TR/PR.

## 2013-10-05 MED ORDER — VANCOMYCIN HCL 10 G IV SOLR
1500.0000 mg | INTRAVENOUS | Status: AC
  Administered 2013-10-06: 1500 mg via INTRAVENOUS
  Filled 2013-10-05: qty 1500

## 2013-10-05 MED ORDER — TRANEXAMIC ACID 100 MG/ML IV SOLN
1000.0000 mg | INTRAVENOUS | Status: AC
  Administered 2013-10-06: 15 mg via INTRAVENOUS
  Filled 2013-10-05: qty 10

## 2013-10-06 ENCOUNTER — Encounter (HOSPITAL_COMMUNITY)
Admission: RE | Disposition: A | Discharge status: Home Health | Payer: Self-pay | Source: Ambulatory Visit | Attending: Orthopedic Surgery

## 2013-10-06 ENCOUNTER — Encounter (HOSPITAL_COMMUNITY): Payer: Self-pay | Admitting: *Deleted

## 2013-10-06 ENCOUNTER — Inpatient Hospital Stay (HOSPITAL_COMMUNITY)
Admission: RE | Admit: 2013-10-06 | Discharge: 2013-10-07 | DRG: 470 | Disposition: A | Discharge status: Home Health | Payer: Medicare Other | Source: Ambulatory Visit | Attending: Orthopedic Surgery | Admitting: Orthopedic Surgery

## 2013-10-06 ENCOUNTER — Encounter (HOSPITAL_COMMUNITY): Payer: Medicare Other | Admitting: Vascular Surgery

## 2013-10-06 ENCOUNTER — Inpatient Hospital Stay (HOSPITAL_COMMUNITY): Payer: Medicare Other | Admitting: Anesthesiology

## 2013-10-06 DIAGNOSIS — I4891 Unspecified atrial fibrillation: Secondary | ICD-10-CM | POA: Diagnosis present

## 2013-10-06 DIAGNOSIS — F17200 Nicotine dependence, unspecified, uncomplicated: Secondary | ICD-10-CM | POA: Diagnosis present

## 2013-10-06 DIAGNOSIS — M1711 Unilateral primary osteoarthritis, right knee: Secondary | ICD-10-CM | POA: Diagnosis present

## 2013-10-06 DIAGNOSIS — Z8042 Family history of malignant neoplasm of prostate: Secondary | ICD-10-CM

## 2013-10-06 DIAGNOSIS — K219 Gastro-esophageal reflux disease without esophagitis: Secondary | ICD-10-CM | POA: Diagnosis present

## 2013-10-06 DIAGNOSIS — E785 Hyperlipidemia, unspecified: Secondary | ICD-10-CM | POA: Diagnosis present

## 2013-10-06 DIAGNOSIS — D62 Acute posthemorrhagic anemia: Secondary | ICD-10-CM | POA: Diagnosis not present

## 2013-10-06 DIAGNOSIS — Z8249 Family history of ischemic heart disease and other diseases of the circulatory system: Secondary | ICD-10-CM

## 2013-10-06 DIAGNOSIS — Z833 Family history of diabetes mellitus: Secondary | ICD-10-CM

## 2013-10-06 DIAGNOSIS — Z8673 Personal history of transient ischemic attack (TIA), and cerebral infarction without residual deficits: Secondary | ICD-10-CM

## 2013-10-06 DIAGNOSIS — I10 Essential (primary) hypertension: Secondary | ICD-10-CM | POA: Diagnosis present

## 2013-10-06 DIAGNOSIS — M1710 Unilateral primary osteoarthritis, unspecified knee: Principal | ICD-10-CM | POA: Diagnosis present

## 2013-10-06 DIAGNOSIS — Z880 Allergy status to penicillin: Secondary | ICD-10-CM

## 2013-10-06 HISTORY — PX: TOTAL KNEE ARTHROPLASTY: SHX125

## 2013-10-06 LAB — CBC
HCT: 37.3 % — ABNORMAL LOW (ref 39.0–52.0)
Hemoglobin: 12.6 g/dL — ABNORMAL LOW (ref 13.0–17.0)
MCH: 32.6 pg (ref 26.0–34.0)
MCHC: 33.8 g/dL (ref 30.0–36.0)
MCV: 96.4 fL (ref 78.0–100.0)
Platelets: 152 10*3/uL (ref 150–400)
RBC: 3.87 MIL/uL — ABNORMAL LOW (ref 4.22–5.81)
RDW: 13.5 % (ref 11.5–15.5)
WBC: 10.7 10*3/uL — ABNORMAL HIGH (ref 4.0–10.5)

## 2013-10-06 LAB — CREATININE, SERUM
Creatinine, Ser: 0.86 mg/dL (ref 0.50–1.35)
GFR calc Af Amer: >90 mL/min (ref >90)
GFR calc non Af Amer: 87 mL/min — ABNORMAL LOW (ref >90)

## 2013-10-06 SURGERY — ARTHROPLASTY, KNEE, TOTAL
Anesthesia: General | Site: Knee | Laterality: Right

## 2013-10-06 MED ORDER — METHOCARBAMOL 100 MG/ML IJ SOLN
500.0000 mg | Freq: Four times a day (QID) | INTRAMUSCULAR | Status: DC | PRN
  Filled 2013-10-06: qty 5

## 2013-10-06 MED ORDER — CHLORHEXIDINE GLUCONATE 4 % EX LIQD: 60.0000 mL | Freq: Once | CUTANEOUS | Status: DC

## 2013-10-06 MED ORDER — ENOXAPARIN SODIUM 30 MG/0.3ML ~~LOC~~ SOLN
30.0000 mg | Freq: Two times a day (BID) | SUBCUTANEOUS | Status: DC
  Administered 2013-10-07: 30 mg via SUBCUTANEOUS
  Filled 2013-10-06 (×3): qty 0.3

## 2013-10-06 MED ORDER — SENNOSIDES-DOCUSATE SODIUM 8.6-50 MG PO TABS: 1.0000 | ORAL_TABLET | Freq: Every evening | ORAL | Status: DC | PRN

## 2013-10-06 MED ORDER — MIDAZOLAM HCL 2 MG/2ML IJ SOLN
INTRAMUSCULAR | Status: AC
  Administered 2013-10-06: 2 mg via INTRAVENOUS
  Filled 2013-10-06: qty 2

## 2013-10-06 MED ORDER — HYDROMORPHONE HCL PF 1 MG/ML IJ SOLN
0.2500 mg | INTRAMUSCULAR | Status: DC | PRN
  Administered 2013-10-06 (×2): 0.5 mg via INTRAVENOUS

## 2013-10-06 MED ORDER — OXYCODONE HCL ER 10 MG PO T12A
10.0000 mg | EXTENDED_RELEASE_TABLET | Freq: Two times a day (BID) | ORAL | Status: DC
  Administered 2013-10-06 – 2013-10-07 (×2): 10 mg via ORAL
  Filled 2013-10-06 (×2): qty 1

## 2013-10-06 MED ORDER — DIPHENHYDRAMINE HCL 12.5 MG/5ML PO ELIX: 12.5000 mg | ORAL_SOLUTION | ORAL | Status: DC | PRN

## 2013-10-06 MED ORDER — ACETAMINOPHEN 650 MG RE SUPP: 650.0000 mg | Freq: Four times a day (QID) | RECTAL | Status: DC | PRN

## 2013-10-06 MED ORDER — ONDANSETRON HCL 4 MG PO TABS: 4.0000 mg | ORAL_TABLET | Freq: Four times a day (QID) | ORAL | Status: DC | PRN

## 2013-10-06 MED ORDER — BUPIVACAINE LIPOSOME 1.3 % IJ SUSP
INTRAMUSCULAR | Status: DC | PRN
  Administered 2013-10-06: 20 mL

## 2013-10-06 MED ORDER — BISACODYL 5 MG PO TBEC: 5.0000 mg | DELAYED_RELEASE_TABLET | Freq: Every day | ORAL | Status: DC | PRN

## 2013-10-06 MED ORDER — ALUM & MAG HYDROXIDE-SIMETH 200-200-20 MG/5ML PO SUSP: 30.0000 mL | ORAL | Status: DC | PRN

## 2013-10-06 MED ORDER — PROPOFOL 10 MG/ML IV BOLUS
INTRAVENOUS | Status: DC | PRN
  Administered 2013-10-06: 18 mg via INTRAVENOUS

## 2013-10-06 MED ORDER — DOCUSATE SODIUM 100 MG PO CAPS
100.0000 mg | ORAL_CAPSULE | Freq: Two times a day (BID) | ORAL | Status: DC
  Administered 2013-10-06 – 2013-10-07 (×3): 100 mg via ORAL
  Filled 2013-10-06 (×4): qty 1

## 2013-10-06 MED ORDER — OXYCODONE HCL 5 MG PO TABS
ORAL_TABLET | ORAL | Status: AC
  Filled 2013-10-06: qty 1

## 2013-10-06 MED ORDER — METOPROLOL TARTRATE 12.5 MG HALF TABLET
ORAL_TABLET | ORAL | Status: AC
  Filled 2013-10-06: qty 3

## 2013-10-06 MED ORDER — FLEET ENEMA 7-19 GM/118ML RE ENEM: 1.0000 | ENEMA | Freq: Once | RECTAL | Status: AC | PRN

## 2013-10-06 MED ORDER — METOPROLOL TARTRATE 25 MG PO TABS
37.5000 mg | ORAL_TABLET | Freq: Two times a day (BID) | ORAL | Status: DC
  Administered 2013-10-07: 37.5 mg via ORAL
  Filled 2013-10-06 (×3): qty 1

## 2013-10-06 MED ORDER — OXYCODONE HCL 5 MG PO TABS
5.0000 mg | ORAL_TABLET | ORAL | Status: DC | PRN
  Administered 2013-10-06 – 2013-10-07 (×5): 10 mg via ORAL
  Filled 2013-10-06 (×5): qty 2

## 2013-10-06 MED ORDER — LACTATED RINGERS IV SOLN
INTRAVENOUS | Status: DC | PRN
  Administered 2013-10-06 (×2): via INTRAVENOUS

## 2013-10-06 MED ORDER — LACTATED RINGERS IV SOLN
INTRAVENOUS | Status: DC
  Administered 2013-10-06: 09:00:00 via INTRAVENOUS

## 2013-10-06 MED ORDER — FENTANYL CITRATE 0.05 MG/ML IJ SOLN
INTRAMUSCULAR | Status: AC
  Administered 2013-10-06: 100 ug via INTRAVENOUS
  Filled 2013-10-06: qty 2

## 2013-10-06 MED ORDER — BUPIVACAINE LIPOSOME 1.3 % IJ SUSP
20.0000 mL | INTRAMUSCULAR | Status: DC
  Filled 2013-10-06: qty 20

## 2013-10-06 MED ORDER — ONDANSETRON HCL 4 MG/2ML IJ SOLN
4.0000 mg | Freq: Four times a day (QID) | INTRAMUSCULAR | Status: DC | PRN
  Administered 2013-10-06: 4 mg via INTRAVENOUS
  Filled 2013-10-06: qty 2

## 2013-10-06 MED ORDER — SODIUM CHLORIDE 0.9 % IV SOLN
INTRAVENOUS | Status: DC
  Administered 2013-10-06 – 2013-10-07 (×2): via INTRAVENOUS

## 2013-10-06 MED ORDER — OXYCODONE HCL 5 MG PO TABS
5.0000 mg | ORAL_TABLET | Freq: Once | ORAL | Status: AC | PRN
  Administered 2013-10-06: 5 mg via ORAL

## 2013-10-06 MED ORDER — LIDOCAINE HCL (CARDIAC) 10 MG/ML IV SOLN
INTRAVENOUS | Status: DC | PRN
  Administered 2013-10-06: 80 mg via INTRAVENOUS

## 2013-10-06 MED ORDER — EPHEDRINE SULFATE 50 MG/ML IJ SOLN
INTRAMUSCULAR | Status: DC | PRN
  Administered 2013-10-06 (×3): 5 mg via INTRAVENOUS

## 2013-10-06 MED ORDER — MECLIZINE HCL 25 MG PO TABS
25.0000 mg | ORAL_TABLET | ORAL | Status: DC | PRN
  Filled 2013-10-06: qty 1

## 2013-10-06 MED ORDER — PROMETHAZINE HCL 25 MG/ML IJ SOLN: 6.2500 mg | INTRAMUSCULAR | Status: DC | PRN

## 2013-10-06 MED ORDER — BUPIVACAINE LIPOSOME 1.3 % IJ SUSP
20.0000 mL | Freq: Once | INTRAMUSCULAR | Status: DC
  Filled 2013-10-06: qty 20

## 2013-10-06 MED ORDER — VANCOMYCIN HCL IN DEXTROSE 1-5 GM/200ML-% IV SOLN
1000.0000 mg | Freq: Two times a day (BID) | INTRAVENOUS | Status: AC
  Administered 2013-10-06: 1000 mg via INTRAVENOUS
  Filled 2013-10-06: qty 200

## 2013-10-06 MED ORDER — ATORVASTATIN CALCIUM 20 MG PO TABS
20.0000 mg | ORAL_TABLET | Freq: Every day | ORAL | Status: DC
  Administered 2013-10-06 – 2013-10-07 (×2): 20 mg via ORAL
  Filled 2013-10-06 (×2): qty 1

## 2013-10-06 MED ORDER — SODIUM CHLORIDE 0.9 % IV SOLN: INTRAVENOUS | Status: DC

## 2013-10-06 MED ORDER — AMIODARONE HCL 200 MG PO TABS
200.0000 mg | ORAL_TABLET | Freq: Every day | ORAL | Status: DC
  Administered 2013-10-06 – 2013-10-07 (×2): 200 mg via ORAL
  Filled 2013-10-06 (×2): qty 1

## 2013-10-06 MED ORDER — FENTANYL CITRATE 0.05 MG/ML IJ SOLN
INTRAMUSCULAR | Status: DC | PRN
  Administered 2013-10-06 (×2): 50 ug via INTRAVENOUS
  Administered 2013-10-06: 100 ug via INTRAVENOUS

## 2013-10-06 MED ORDER — OXYCODONE HCL 5 MG/5ML PO SOLN: 5.0000 mg | Freq: Once | ORAL | Status: AC | PRN

## 2013-10-06 MED ORDER — HYDROMORPHONE HCL PF 1 MG/ML IJ SOLN
INTRAMUSCULAR | Status: AC
  Filled 2013-10-06: qty 1

## 2013-10-06 MED ORDER — METHOCARBAMOL 500 MG PO TABS
500.0000 mg | ORAL_TABLET | Freq: Four times a day (QID) | ORAL | Status: DC | PRN
  Administered 2013-10-06 – 2013-10-07 (×3): 500 mg via ORAL
  Filled 2013-10-06 (×2): qty 1

## 2013-10-06 MED ORDER — DEXAMETHASONE SODIUM PHOSPHATE 10 MG/ML IJ SOLN
INTRAMUSCULAR | Status: DC | PRN
  Administered 2013-10-06: 6 mg

## 2013-10-06 MED ORDER — CELECOXIB 200 MG PO CAPS
200.0000 mg | ORAL_CAPSULE | Freq: Two times a day (BID) | ORAL | Status: DC
  Administered 2013-10-06 – 2013-10-07 (×2): 200 mg via ORAL
  Filled 2013-10-06 (×4): qty 1

## 2013-10-06 MED ORDER — BUPIVACAINE-EPINEPHRINE (PF) 0.5% -1:200000 IJ SOLN
INTRAMUSCULAR | Status: AC
  Filled 2013-10-06: qty 10

## 2013-10-06 MED ORDER — SODIUM CHLORIDE 0.9 % IR SOLN
Status: DC | PRN
  Administered 2013-10-06 (×2): 1000 mL

## 2013-10-06 MED ORDER — PNEUMOCOCCAL VAC POLYVALENT 25 MCG/0.5ML IJ INJ
0.5000 mL | INJECTION | INTRAMUSCULAR | Status: AC
  Administered 2013-10-07: 0.5 mL via INTRAMUSCULAR
  Filled 2013-10-06: qty 0.5

## 2013-10-06 MED ORDER — MENTHOL 3 MG MT LOZG: 1.0000 | LOZENGE | OROMUCOSAL | Status: DC | PRN

## 2013-10-06 MED ORDER — METOCLOPRAMIDE HCL 5 MG/ML IJ SOLN: 5.0000 mg | Freq: Three times a day (TID) | INTRAMUSCULAR | Status: DC | PRN

## 2013-10-06 MED ORDER — PHENYLEPHRINE HCL 10 MG/ML IJ SOLN
INTRAMUSCULAR | Status: DC | PRN
  Administered 2013-10-06: 80 ug via INTRAVENOUS
  Administered 2013-10-06: 40 ug via INTRAVENOUS
  Administered 2013-10-06 (×3): 80 ug via INTRAVENOUS

## 2013-10-06 MED ORDER — BUPIVACAINE-EPINEPHRINE PF 0.5-1:200000 % IJ SOLN
INTRAMUSCULAR | Status: DC | PRN
  Administered 2013-10-06: 100 mg via PERINEURAL

## 2013-10-06 MED ORDER — DEXAMETHASONE SODIUM PHOSPHATE 4 MG/ML IJ SOLN
INTRAMUSCULAR | Status: DC | PRN
  Administered 2013-10-06: 8 mg via INTRAVENOUS

## 2013-10-06 MED ORDER — METOCLOPRAMIDE HCL 10 MG PO TABS: 5.0000 mg | ORAL_TABLET | Freq: Three times a day (TID) | ORAL | Status: DC | PRN

## 2013-10-06 MED ORDER — ZOLPIDEM TARTRATE 5 MG PO TABS: 5.0000 mg | ORAL_TABLET | Freq: Every evening | ORAL | Status: DC | PRN

## 2013-10-06 MED ORDER — ONDANSETRON HCL 4 MG/2ML IJ SOLN
INTRAMUSCULAR | Status: DC | PRN
  Administered 2013-10-06: 4 mg via INTRAVENOUS

## 2013-10-06 MED ORDER — PHENOL 1.4 % MT LIQD: 1.0000 | OROMUCOSAL | Status: DC | PRN

## 2013-10-06 MED ORDER — FLUTICASONE PROPIONATE 50 MCG/ACT NA SUSP
2.0000 | Freq: Every day | NASAL | Status: DC
  Administered 2013-10-07: 2 via NASAL
  Filled 2013-10-06: qty 16

## 2013-10-06 MED ORDER — METOPROLOL TARTRATE 25 MG PO TABS
37.5000 mg | ORAL_TABLET | Freq: Once | ORAL | Status: AC
  Administered 2013-10-06: 37.5 mg via ORAL
  Filled 2013-10-06: qty 1

## 2013-10-06 MED ORDER — ACETAMINOPHEN 325 MG PO TABS: 650.0000 mg | ORAL_TABLET | Freq: Four times a day (QID) | ORAL | Status: DC | PRN

## 2013-10-06 MED ORDER — BUPIVACAINE-EPINEPHRINE 0.5% -1:200000 IJ SOLN
INTRAMUSCULAR | Status: DC | PRN
  Administered 2013-10-06: 30 mL

## 2013-10-06 MED ORDER — HYDROMORPHONE HCL PF 1 MG/ML IJ SOLN: 1.0000 mg | INTRAMUSCULAR | Status: DC | PRN

## 2013-10-06 MED ORDER — METHOCARBAMOL 500 MG PO TABS
ORAL_TABLET | ORAL | Status: AC
  Filled 2013-10-06: qty 1

## 2013-10-06 MED ORDER — LISINOPRIL 20 MG PO TABS
20.0000 mg | ORAL_TABLET | Freq: Every day | ORAL | Status: DC
  Administered 2013-10-06 – 2013-10-07 (×2): 20 mg via ORAL
  Filled 2013-10-06 (×2): qty 1

## 2013-10-06 SURGICAL SUPPLY — 53 items
BANDAGE ESMARK 6X9 LF (GAUZE/BANDAGES/DRESSINGS) ×1 IMPLANT
BLADE SAGITTAL 13X1.27X60 (BLADE) ×2 IMPLANT
BLADE SAW SGTL 83.5X18.5 (BLADE) ×2 IMPLANT
BNDG ESMARK 6X9 LF (GAUZE/BANDAGES/DRESSINGS) ×2
BOWL SMART MIX CTS (DISPOSABLE) ×2 IMPLANT
CEMENT BONE SIMPLEX SPEEDSET (Cement) ×4 IMPLANT
CLOTH BEACON ORANGE TIMEOUT ST (SAFETY) ×2 IMPLANT
COVER SURGICAL LIGHT HANDLE (MISCELLANEOUS) ×2 IMPLANT
CUFF TOURNIQUET SINGLE 34IN LL (TOURNIQUET CUFF) ×2 IMPLANT
DRAPE EXTREMITY T 121X128X90 (DRAPE) ×2 IMPLANT
DRAPE INCISE IOBAN 66X45 STRL (DRAPES) ×4 IMPLANT
DRAPE PROXIMA HALF (DRAPES) ×2 IMPLANT
DRAPE U-SHAPE 47X51 STRL (DRAPES) ×2 IMPLANT
DRSG ADAPTIC 3X8 NADH LF (GAUZE/BANDAGES/DRESSINGS) ×2 IMPLANT
DRSG PAD ABDOMINAL 8X10 ST (GAUZE/BANDAGES/DRESSINGS) ×2 IMPLANT
DURAPREP 26ML APPLICATOR (WOUND CARE) ×4 IMPLANT
ELECT REM PT RETURN 9FT ADLT (ELECTROSURGICAL) ×2
ELECTRODE REM PT RTRN 9FT ADLT (ELECTROSURGICAL) ×1 IMPLANT
EVACUATOR 1/8 PVC DRAIN (DRAIN) ×2 IMPLANT
GLOVE BIOGEL M 7.0 STRL (GLOVE) IMPLANT
GLOVE BIOGEL PI IND STRL 7.5 (GLOVE) IMPLANT
GLOVE BIOGEL PI IND STRL 8.5 (GLOVE) ×2 IMPLANT
GLOVE BIOGEL PI INDICATOR 7.5 (GLOVE)
GLOVE BIOGEL PI INDICATOR 8.5 (GLOVE) ×2
GLOVE SURG ORTHO 8.0 STRL STRW (GLOVE) ×4 IMPLANT
GOWN PREVENTION PLUS XLARGE (GOWN DISPOSABLE) ×4 IMPLANT
GOWN STRL NON-REIN LRG LVL3 (GOWN DISPOSABLE) ×4 IMPLANT
HANDPIECE INTERPULSE COAX TIP (DISPOSABLE) ×1
HOOD PEEL AWAY FACE SHEILD DIS (HOOD) ×8 IMPLANT
KIT BASIN OR (CUSTOM PROCEDURE TRAY) ×2 IMPLANT
KIT ROOM TURNOVER OR (KITS) ×2 IMPLANT
MANIFOLD NEPTUNE II (INSTRUMENTS) ×2 IMPLANT
NEEDLE 22X1 1/2 (OR ONLY) (NEEDLE) ×2 IMPLANT
NEEDLE HYPO 21X1.5 SAFETY (NEEDLE) ×2 IMPLANT
NS IRRIG 1000ML POUR BTL (IV SOLUTION) ×2 IMPLANT
PACK TOTAL JOINT (CUSTOM PROCEDURE TRAY) ×2 IMPLANT
PAD ARMBOARD 7.5X6 YLW CONV (MISCELLANEOUS) ×4 IMPLANT
PADDING CAST COTTON 6X4 STRL (CAST SUPPLIES) ×2 IMPLANT
SET HNDPC FAN SPRY TIP SCT (DISPOSABLE) ×1 IMPLANT
SPONGE GAUZE 4X4 12PLY (GAUZE/BANDAGES/DRESSINGS) ×2 IMPLANT
STAPLER VISISTAT 35W (STAPLE) ×2 IMPLANT
SUCTION FRAZIER TIP 10 FR DISP (SUCTIONS) ×2 IMPLANT
SUT BONE WAX W31G (SUTURE) ×2 IMPLANT
SUT VIC AB 0 CTB1 27 (SUTURE) ×4 IMPLANT
SUT VIC AB 1 CT1 27 (SUTURE) ×2
SUT VIC AB 1 CT1 27XBRD ANBCTR (SUTURE) ×2 IMPLANT
SUT VIC AB 2-0 CT1 27 (SUTURE) ×2
SUT VIC AB 2-0 CT1 TAPERPNT 27 (SUTURE) ×2 IMPLANT
SYR CONTROL 10ML LL (SYRINGE) ×2 IMPLANT
TOWEL OR 17X24 6PK STRL BLUE (TOWEL DISPOSABLE) ×2 IMPLANT
TOWEL OR 17X26 10 PK STRL BLUE (TOWEL DISPOSABLE) ×2 IMPLANT
TRAY FOLEY CATH 16FRSI W/METER (SET/KITS/TRAYS/PACK) IMPLANT
WATER STERILE IRR 1000ML POUR (IV SOLUTION) ×4 IMPLANT

## 2013-10-06 NOTE — Anesthesia Procedure Notes (Addendum)
Anesthesia Regional Block:  Adductor canal block  Pre-Anesthetic Checklist: ,, timeout performed, Correct Patient, Correct Site, Correct Laterality, Correct Procedure, Correct Position, site marked, Risks and benefits discussed,  Surgical consent,  Pre-op evaluation,  At surgeon's request and post-op pain management  Laterality: Right  Prep: chloraprep       Needles:  Injection technique: Single-shot  Needle Type: Stimulator Needle - 80     Needle Length: 10cm 10 cm Needle Gauge: 21 and 21 G    Additional Needles:  Procedures: ultrasound guided (picture in chart) Adductor canal block Narrative:  Start time: 10/06/2013 8:51 AM End time: 10/06/2013 9:01 AM Injection made incrementally with aspirations every 5 mL.  Performed by: Personally  Anesthesiologist: Halford Decamp, MD

## 2013-10-06 NOTE — Transfer of Care (Signed)
Immediate Anesthesia Transfer of Care Note  Patient: Carl Alvarez  Procedure(s) Performed: Procedure(s): TOTAL KNEE ARTHROPLASTY (Right)  Patient Location: PACU  Anesthesia Type:General and Regional combined for post op pain  Level of Consciousness: awake, alert , oriented and patient cooperative  Airway & Oxygen Therapy: Patient Spontanous Breathing and Patient connected to nasal cannula oxygen  Post-op Assessment: Report given to PACU RN, Post -op Vital signs reviewed and stable and Patient moving all extremities X 4  Post vital signs: Reviewed and stable  Complications: No apparent anesthesia complications

## 2013-10-06 NOTE — Care Management Note (Addendum)
CARE MANAGEMENT NOTE 10/06/2013  Patient:  Carl Alvarez, Carl Alvarez   Account Number:  [id number]  Date Initiated:  10/06/2013  Documentation initiated by:  Vance Peper  Subjective/Objective Assessment:   67 yr old male s/p right total knee arthroplasty.     Action/Plan:   PT/OT eval   Preoperatively setup with [facility].  CM will follow. Anticipated DC Date:     Anticipated DC Plan:  HOME W HOME HEALTH SERVICES         Choice offered to / List presented to:             Status of service:  In process, will continue to follow

## 2013-10-06 NOTE — Progress Notes (Signed)
Orthopedic Tech Progress Note Patient Details:  Christianjames Soule Apr 18, 1946 [id number] CPM applied to Right LE with appropriate settings. OHF applied to bed. Footsie roll provided.  CPM Right Knee CPM Right Knee: On Right Knee Flexion (Degrees): 90 Right Knee Extension (Degrees): 0   Asia R Thompson 10/06/2013, 12:33 PM

## 2013-10-06 NOTE — Anesthesia Preprocedure Evaluation (Addendum)
Anesthesia Evaluation  Patient identified by MRN, date of birth, ID band Patient awake    Reviewed: Allergy & Precautions, H&P , NPO status , Patient's Chart, lab work & pertinent test results, reviewed documented beta blocker date and time   History of Anesthesia Complications Negative for: history of anesthetic complications  Airway Mallampati: II TM Distance: >3 FB     Dental  (+) Teeth Intact and Dental Advisory Given   Pulmonary Current Smoker,    Pulmonary exam normal       Cardiovascular hypertension, Pt. on home beta blockers + dysrhythmias Atrial Fibrillation     Neuro/Psych TIAnegative psych ROS   GI/Hepatic Neg liver ROS, GERD-  Medicated,  Endo/Other  negative endocrine ROS  Renal/GU negative Renal ROS     Musculoskeletal   Abdominal   Peds  Hematology negative hematology ROS (+)   Anesthesia Other Findings   Reproductive/Obstetrics                          Anesthesia Physical Anesthesia Plan  ASA: II  Anesthesia Plan: General   Post-op Pain Management:    Induction: Intravenous  Airway Management Planned: LMA  Additional Equipment:   Intra-op Plan:   Post-operative Plan: Extubation in OR  Informed Consent: I have reviewed the patients History and Physical, chart, labs and discussed the procedure including the risks, benefits and alternatives for the proposed anesthesia with the patient or authorized representative who has indicated his/her understanding and acceptance.   Dental advisory given  Plan Discussed with: CRNA, Anesthesiologist and Surgeon  Anesthesia Plan Comments:        Anesthesia Quick Evaluation

## 2013-10-06 NOTE — Preoperative (Addendum)
Beta Blockers   Reason not to administer Beta Blockers:Not Applicable  Pt took am lopressor 37.5 mg po, 0814.

## 2013-10-06 NOTE — Plan of Care (Signed)
Problem: Consults Goal: Diagnosis- Total Joint Replacement Primary Total Knee Right     

## 2013-10-06 NOTE — Anesthesia Postprocedure Evaluation (Signed)
Anesthesia Post Note  Patient: Carl Alvarez  Procedure(s) Performed: Procedure(s) (LRB): TOTAL KNEE ARTHROPLASTY (Right)  Anesthesia type: general  Patient location: PACU  Post pain: Pain level controlled  Post assessment: Patient's Cardiovascular Status Stable  Last Vitals:  Filed Vitals:   10/06/13 1311  BP: 138/68  Pulse: 60  Temp: 36.4 C  Resp: 14    Post vital signs: Reviewed and stable  Level of consciousness: sedated  Complications: No apparent anesthesia complications

## 2013-10-06 NOTE — Progress Notes (Signed)
Utilization review completed.  

## 2013-10-06 NOTE — Op Note (Signed)
TOTAL KNEE REPLACEMENT OPERATIVE NOTE:  10/06/2013  2:34 PM  PATIENT:  Carl Alvarez  67 y.o. male  PRE-OPERATIVE DIAGNOSIS:  osteoarthritis right knee  POST-OPERATIVE DIAGNOSIS:  osteoarthritis right knee  PROCEDURE:  Procedure(s): TOTAL KNEE ARTHROPLASTY  SURGEON:  Surgeon(s): Vickey Huger, MD  PHYSICIAN ASSISTANT: Carlynn Spry, [facility]  ANESTHESIA:   general  DRAINS: Hemovac  SPECIMEN: None  COUNTS:  Correct  TOURNIQUET:  * Missing tourniquet times found for documented tourniquets in log:  [id number] *  DICTATION:  Indication for procedure:    The patient is a 67 y.o. male who has failed conservative treatment for osteoarthritis right knee.  Informed consent was obtained prior to anesthesia. The risks versus benefits of the operation were explain and in a way the patient can, and did, understand.   On the implant demand matching protocol, this patient scored 10.  Therefore, this patient was not receive a polyethylene insert with vitamin E which is a high demand implant.  Description of procedure:     The patient was taken to the operating room and placed under anesthesia.  The patient was positioned in the usual fashion taking care that all body parts were adequately padded and/or protected.  I foley catheter was not placed.  A tourniquet was applied and the leg prepped and draped in the usual sterile fashion.  The extremity was exsanguinated with the esmarch and tourniquet inflated to 350 mmHg.  Pre-operative range of motion was normal.  The knee was in 5 degree of mild varus.  A midline incision approximately 6-7 inches long was made with a #10 blade.  A new blade was used to make a parapatellar arthrotomy going 2-3 cm into the quadriceps tendon, over the patella, and alongside the medial aspect of the patellar tendon.  A synovectomy was then performed with the #10 blade and forceps. I then elevated the deep MCL off the medial tibial metaphysis subperiosteally around to the  semimembranosus attachment.    I everted the patella and used calipers to measure patellar thickness.  I used the reamer to ream down to appropriate thickness to recreate the native thickness.  I then removed excess bone with the rongeur and sagittal saw.  I used the appropriately sized template and drilled the three lug holes.  I then put the trial in place and measured the thickness with the calipers to ensure recreation of the native thickness.  The trial was then removed and the patella subluxed and the knee brought into flexion.  A homan retractor was place to retract and protect the patella and lateral structures.  A Z-retractor was place medially to protect the medial structures.  The extra-medullary alignment system was used to make cut the tibial articular surface perpendicular to the anamotic axis of the tibia and in 3 degrees of posterior slope.  The cut surface and alignment jig was removed.  I then used the intramedullary alignment guide to make a 6 valgus cut on the distal femur.  I then marked out the epicondylar axis on the distal femur.  The posterior condylar axis measured 3 degrees.  I then used the anterior referencing sizer and measured the femur to be a size 9.  The 4-In-1 cutting block was screwed into place in external rotation matching the posterior condylar angle, making our cuts perpendicular to the epicondylar axis.  Anterior, posterior and chamfer cuts were made with the sagittal saw.  The cutting block and cut pieces were removed.  A lamina spreader was placed in  90 degrees of flexion.  The ACL, PCL, menisci, and posterior condylar osteophytes were removed.  A 14 mm spacer blocked was found to offer good flexion and extension gap balance after minimal in degree releasing.   The scoop retractor was then placed and the femoral finishing block was pinned in place.  The small sagittal saw was used as well as the lug drill to finish the femur.  The block and cut surfaces were removed  and the medullary canal hole filled with autograft bone from the cut pieces.  The tibia was delivered forward in deep flexion and external rotation.  A size F tray was selected and pinned into place centered on the medial 1/3 of the tibial tubercle.  The reamer and keel was used to prepare the tibia through the tray.    I then trialed with the size 9 femur, size F tibia, a 14 mm insert and the 32 patella.  I had excellent flexion/extension gap balance, excellent patella tracking.  Flexion was full and beyond 120 degrees; extension was zero.  These components were chosen and the staff opened them to me on the back table while the knee was lavaged copiously and the cement mixed.  The soft tissue was infiltrated with 60cc of exparel 1.3% through a 21 gauge needle.  I cemented in the components and removed all excess cement.  The polyethylene tibial component was snapped into place and the knee placed in extension while cement was hardening.  The capsule was infilltrated with 30cc of .25% Marcaine with epinephrine.  A hemovac was place in the joint exiting superolaterally.  A pain pump was place superomedially superficial to the arthrotomy.  Once the cement was hard, the tourniquet was let down.  Hemostasis was obtained.  The arthrotomy was closed with figure-8 #1 vicryl sutures.  The deep soft tissues were closed with #0 vicryls and the subcuticular layer closed with a running #2-0 vicryl.  The skin was reapproximated and closed with skin staples.  The wound was dressed with xeroform, 4 x4's, 2 ABD sponges, a single layer of webril and a TED stocking.   The patient was then awakened, extubated, and taken to the recovery room in stable condition.  BLOOD LOSS:  300cc DRAINS: 1 hemovac, 1 pain catheter COMPLICATIONS:  None.  PLAN OF CARE: Admit to inpatient   PATIENT DISPOSITION:  PACU - hemodynamically stable.   Delay start of Pharmacological VTE agent (>24hrs) due to surgical blood loss or risk of  bleeding:  not applicable  Please fax a copy of this op note to my office at [phone] (please only include page 1 and 2 of the Case Information op note)
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
## 2013-10-06 NOTE — Evaluation (Signed)
Physical Therapy Evaluation Patient Details Name: Carl Alvarez MRN: [id number] DOB: Nov 18, 1945 Today's Date: 10/06/2013 Time: 7829-5621 PT Time Calculation (min): 20 min  PT Assessment / Plan / Recommendation History of Present Illness  Pt is a 67 y/o male admitted s/p R TKA on 10/06/13.  Clinical Impression  This patient presents with acute pain and decreased functional independence following the above mentioned procedure. At the time of PT eval, pt was able to perform SLR and demonstrate functional mobility with min guard assist. This patient is appropriate for skilled PT interventions to address functional limitations, improve safety and independence with functional mobility, and return to PLOF.     PT Assessment  Patient needs continued PT services    Follow Up Recommendations  Home health PT    Does the patient have the potential to tolerate intense rehabilitation      Barriers to Discharge Decreased caregiver support Pt lives alone - is to have family come to stay with him    Equipment Recommendations  Rolling walker with 5" wheels;3in1 (PT)    Recommendations for Other Services     Frequency 7X/week    Precautions / Restrictions Precautions Precautions: Fall;Knee Restrictions Weight Bearing Restrictions: Yes RLE Weight Bearing: Weight bearing as tolerated   Pertinent Vitals/Pain Pt reports no pain at rest, and moderate pain while ambulating. Pt did not rate pain on 0-10 scale.       Mobility  Bed Mobility Bed Mobility: Supine to Sit;Sitting - Scoot to Edge of Bed Supine to Sit: 4: Min guard;HOB elevated;With rails Sitting - Scoot to Edge of Bed: 5: Supervision Details for Bed Mobility Assistance: VC's for sequencing and technique. Cautioned pt for safety awareness as he was very impulsive and was putting pressure on his drain line.  Transfers Transfers: Sit to Stand;Stand to Sit Sit to Stand: 4: Min guard;From bed;With upper extremity assist Stand to Sit: 4:  Min guard;To chair/3-in-1;With upper extremity assist Details for Transfer Assistance: VC's for hand placement on seated surface and for safety awareness as pt is impulsive with initiating transfers before therapist and equipment are ready. Ambulation/Gait Ambulation/Gait Assistance: 4: Min guard Ambulation Distance (Feet): 45 Feet Assistive device: Rolling walker Ambulation/Gait Assistance Details: VC's for sequencing and safety awareness with the RW.  Gait Pattern: Step-to pattern;Decreased stride length;Trunk flexed Gait velocity: Decreased Stairs: No    Exercises Total Joint Exercises Ankle Circles/Pumps: 10 reps Quad Sets: 10 reps   PT Diagnosis: Difficulty walking;Acute pain  PT Problem List: Decreased strength;Decreased range of motion;Decreased activity tolerance;Decreased balance;Decreased mobility;Decreased knowledge of use of DME;Decreased safety awareness;Decreased knowledge of precautions;Pain PT Treatment Interventions: DME instruction;Gait training;Stair training;Therapeutic activities;Functional mobility training;Therapeutic exercise;Neuromuscular re-education;Patient/family education     PT Goals(Current goals can be found in the care plan section) Acute Rehab PT Goals Patient Stated Goal: To return home PT Goal Formulation: With patient Time For Goal Achievement: 10/13/13 Potential to Achieve Goals: Good  Visit Information  Last PT Received On: 10/06/13 Assistance Needed: +1 History of Present Illness: Pt is a 67 y/o male admitted s/p R TKA on 10/06/13.       Prior Functioning  Home Living Family/patient expects to be discharged to:: Private residence Living Arrangements: Alone Available Help at Discharge: Family;Available 24 hours/day Type of Home: House Home Access: Stairs to enter [employer] of Steps: 3 Entrance Stairs-Rails: Right Home Layout: One level Home Equipment: None Prior Function Level of Independence:  Independent Communication Communication: No difficulties Dominant Hand: Right    Cognition  Cognition Arousal/Alertness:  Awake/alert Behavior During Therapy: WFL for tasks assessed/performed Overall Cognitive Status: Within Functional Limits for tasks assessed    Extremity/Trunk Assessment Upper Extremity Assessment Upper Extremity Assessment: Overall WFL for tasks assessed Lower Extremity Assessment Lower Extremity Assessment: RLE deficits/detail RLE Deficits / Details: Decreased strength and AROM consistent with R TKA RLE: Unable to fully assess due to pain Cervical / Trunk Assessment Cervical / Trunk Assessment: Normal   Balance Balance Balance Assessed: Yes  End of Session PT - End of Session Equipment Utilized During Treatment: Gait belt Activity Tolerance: Patient tolerated treatment well Patient left: in chair;with call bell/phone within reach Nurse Communication: Mobility status CPM Right Knee CPM Right Knee: Off Right Knee Flexion (Degrees): 90 Right Knee Extension (Degrees): 0  GP     Ruthann Cancer 10/06/2013, 4:27 PM  Ruthann Cancer, PT, DPT [phone]

## 2013-10-07 ENCOUNTER — Encounter (HOSPITAL_COMMUNITY): Payer: Self-pay | Admitting: Orthopedic Surgery

## 2013-10-07 LAB — BASIC METABOLIC PANEL
BUN: 12 mg/dL (ref 6–23)
CO2: 28 mEq/L (ref 19–32)
Calcium: 8.1 mg/dL — ABNORMAL LOW (ref 8.4–10.5)
Chloride: 104 mEq/L (ref 96–112)
Creatinine, Ser: 0.86 mg/dL (ref 0.50–1.35)
GFR calc Af Amer: >90 mL/min (ref >90)
GFR calc non Af Amer: 87 mL/min — ABNORMAL LOW (ref >90)
Glucose, Bld: 128 mg/dL — ABNORMAL HIGH (ref 70–99)
Potassium: 3.7 mEq/L (ref 3.5–5.1)
Sodium: 138 mEq/L (ref 135–145)

## 2013-10-07 LAB — CBC
HCT: 26.8 % — ABNORMAL LOW (ref 39.0–52.0)
Hemoglobin: 9.2 g/dL — ABNORMAL LOW (ref 13.0–17.0)
MCH: 32.9 pg (ref 26.0–34.0)
MCHC: 34.3 g/dL (ref 30.0–36.0)
MCV: 95.7 fL (ref 78.0–100.0)
Platelets: 130 10*3/uL — ABNORMAL LOW (ref 150–400)
RBC: 2.8 MIL/uL — ABNORMAL LOW (ref 4.22–5.81)
RDW: 13.3 % (ref 11.5–15.5)
WBC: 12 10*3/uL — ABNORMAL HIGH (ref 4.0–10.5)

## 2013-10-07 MED ORDER — METHOCARBAMOL 500 MG PO TABS: 500.0000 mg | ORAL_TABLET | Freq: Four times a day (QID) | ORAL | Status: DC | PRN

## 2013-10-07 MED ORDER — ENOXAPARIN SODIUM 40 MG/0.4ML ~~LOC~~ SOLN: 40.0000 mg | SUBCUTANEOUS | Status: DC

## 2013-10-07 MED ORDER — OXYCODONE HCL 5 MG PO TABS: 5.0000 mg | ORAL_TABLET | ORAL | Status: DC | PRN

## 2013-10-07 NOTE — Progress Notes (Signed)
SPORTS MEDICINE AND JOINT REPLACEMENT  Carl Spurling, MD   Carl Cabal, PA-C [address] [place], [place], [place]  [address]                             [phone]   PROGRESS NOTE  Subjective:  negative for Chest Pain  negative for Shortness of Breath  negative for Nausea/Vomiting   negative for Calf Pain  negative for Bowel Movement   Tolerating Diet: yes         Patient reports pain as 3 on 0-10 scale.    Objective: Vital signs in last 24 hours:   Patient Vitals for the past 24 hrs:  BP Temp Temp src Pulse Resp SpO2  10/07/13 1300 124/55 mmHg 98.1 F (36.7 C) - 77 18 99 %  10/07/13 0506 115/47 mmHg 98.4 F (36.9 C) Oral 64 18 98 %  10/07/13 0355 - - - - 18 99 %  10/07/13 0150 131/60 mmHg 98.6 F (37 C) Oral 71 19 99 %  10/07/13 0000 - - - - 18 98 %  10/06/13 2102 122/55 mmHg 98.2 F (36.8 C) Oral 69 18 98 %  10/06/13 2000 - - - - 18 98 %    @flow {1959:LAST@   Intake/Output from previous day:   12/15 0701 - 12/16 0700 In: 2140 [P.O.:240; I.V.:1900] Out: 1340 [Urine:900; Drains:420]   Intake/Output this shift:   12/16 0701 - 12/16 1900 In: 600 [P.O.:600] Out: 100 [Drains:100]   Intake/Output     12/15 0701 - 12/16 0700 12/16 0701 - 12/17 0700   P.O. 240 600   I.V. 1900    Total Intake 2140 600   Urine 900    Drains 420 100   Blood 20    Total Output 1340 100   Net +800 +500        Urine Occurrence 2 x       LABORATORY DATA:  Recent Labs  10/06/13 1440 10/07/13 0500  WBC 10.7* 12.0*  HGB 12.6* 9.2*  HCT 37.3* 26.8*  PLT 152 130*    Recent Labs  10/06/13 1440 10/07/13 0500  NA  --  138  K  --  3.7  CL  --  104  CO2  --  28  BUN  --  12  CREATININE 0.86 0.86  GLUCOSE  --  128*  CALCIUM  --  8.1*   Lab Results  Component Value Date   INR 1.01 09/26/2013   INR 1.2 01/22/2012    Examination:  General appearance: alert, cooperative and no distress Extremities: Homans sign is negative, no sign of DVT  Wound Exam: clean, dry,  intact   Drainage:  Moderate amount Serosanguinous exudate  Motor Exam: EHL and FHL Intact  Sensory Exam: Deep Peroneal normal   Assessment:    1 Day Post-Op  Procedure(s) (LRB): TOTAL KNEE ARTHROPLASTY (Right)  ADDITIONAL DIAGNOSIS:  Active Problems:   Osteoarthritis of right knee  Acute Blood Loss Anemia from blood loss due to hemovac.  Dressing changed patient asymptomatic   Plan: Physical Therapy as ordered Weight Bearing as Tolerated (WBAT)  DVT Prophylaxis:  Lovenox  DISCHARGE PLAN: Home  DISCHARGE NEEDS: HHPT, CPM, Walker and 3-in-1 comode seat         Carl Alvarez 10/07/2013, 5:20 PM

## 2013-10-07 NOTE — Clinical Documentation Improvement (Signed)
THIS DOCUMENT IS NOT A PERMANENT PART OF THE MEDICAL RECORD  Please update your documentation with the medical record to reflect your response to this query. If you need help knowing how to do this please call [phone].  10/07/13  Dear Carl Alvarez Carl Alvarez  In an effort to better capture your patient's severity of illness, reflect appropriate length of stay and utilization of resources, a review of the patient medical record has revealed the following indicators.    Based on your clinical judgment, please clarify and document in a progress note and/or discharge summary the clinical condition associated with the following supporting information:  In responding to this query please exercise your independent judgment.  The fact that a query is asked, does not imply that any particular answer is desired or expected.   Possible Clinical Conditions?   " Expected Acute Blood Loss Anemia  " Acute Blood Loss Anemia  " Acute on chronic blood loss anemia  " Precipitous drop in Hematocrit  " Other Condition  " Cannot Clinically Determine    Supporting Information: Per 12/15 operative note, 300 cc blood loss.   Diagnostics: H&H on 12/16: 9.2/26.8 H&H on 12/15: 12.6/37.3   Reviewed: additional documentation in the medical record  Thank Patsy Lager, Clinical Documentation Specialist: [phone] Health Information Management [name]    Expected blood loss anemia

## 2013-10-07 NOTE — Progress Notes (Signed)
Physical Therapy Treatment Patient Details Name: Carl Alvarez MRN: [id number] DOB: 06/27/1946 Today's Date: 10/07/2013 Time: 8295-6213 PT Time Calculation (min): 29 min  PT Assessment / Plan / Recommendation  History of Present Illness Pt is a 67 y/o male admitted s/p R TKA on 10/06/13.   PT Comments   Pt progressing well with mobility + PT goals.  Ambulated from room<>ortho gym & practiced steps.  Pt presents at supervision level for transfers & ambulation but he does require min cueing for increased safety awareness as he tends to be slightly impulsive.     Follow Up Recommendations  Home health PT     Does the patient have the potential to tolerate intense rehabilitation     Barriers to Discharge        Equipment Recommendations  Rolling walker with 5" wheels;3in1 (PT)    Recommendations for Other Services    Frequency 7X/week   Progress towards PT Goals Progress towards PT goals: Progressing toward goals  Plan Current plan remains appropriate    Precautions / Restrictions Precautions Precautions: Fall;Knee Restrictions Weight Bearing Restrictions: Yes RLE Weight Bearing: Weight bearing as tolerated       Mobility  Bed Mobility Bed Mobility: Not assessed Supine to Sit: HOB elevated;5: Supervision Sitting - Scoot to Edge of Bed: 6: Modified independent (Device/Increase time) Details for Bed Mobility Assistance: VC's for safety/hand placement w/ RW as pt was noted to attempt to pull up on RW vs push up from EOB. Transfers Transfers: Sit to Stand;Stand to Sit Sit to Stand: 5: Supervision;With upper extremity assist;With armrests;From chair/3-in-1 Stand to Sit: 6: Modified independent (Device/Increase time);With upper extremity assist;With armrests;To chair/3-in-1 Details for Transfer Assistance: cues for safety as pt stood without having RW in front of him Ambulation/Gait Ambulation/Gait Assistance: 5: Supervision Ambulation Distance (Feet): 140 Feet Assistive  device: Rolling walker Ambulation/Gait Assistance Details: supervision for safety.  Pt demonstrated proper sequencing.  Cues to roll RW vs picking it up.   Gait Pattern: Step-through pattern;Decreased stride length;Decreased step length - left Stairs: Yes Stairs Assistance: 4: Min guard Stairs Assistance Details (indicate cue type and reason): cues for sequencing & technique.  Pt used rail on Rt to simulate pole he has that he can hold onto.  Rt knee buckled with initially with 1st step but then able to complete rest of steps without any buckling noted.   Stair Management Technique: One rail Right;Forwards;Step to pattern Number of Stairs: 5 Wheelchair Mobility Wheelchair Mobility: No    Exercises Total Joint Exercises Ankle Circles/Pumps: AROM;Both;10 reps Quad Sets: AROM;Strengthening;Both;10 reps Heel Slides: AROM;Strengthening;Right;15 reps Hip ABduction/ADduction: AROM;Strengthening;Right;15 reps Straight Leg Raises: AROM;Right;15 reps;Strengthening Long Arc Quad: AROM;Strengthening;Right;15 reps Knee Flexion: AAROM;Right;10 reps (self AAROM)     PT Goals (current goals can now be found in the care plan section) Acute Rehab PT Goals Patient Stated Goal: To return home later today PT Goal Formulation: With patient Time For Goal Achievement: 10/13/13 Potential to Achieve Goals: Good  Visit Information  Last PT Received On: 10/07/13 Assistance Needed: +1 History of Present Illness: Pt is a 67 y/o male admitted s/p R TKA on 10/06/13.    Subjective Data  Patient Stated Goal: To return home later today   Cognition  Cognition Arousal/Alertness: Awake/alert Behavior During Therapy: WFL for tasks assessed/performed Overall Cognitive Status: Within Functional Limits for tasks assessed    Balance  Balance Balance Assessed: Yes Static Sitting Balance Static Sitting - Balance Support: No upper extremity supported;Feet supported Static Standing Balance Static Standing -  Balance Support: No upper extremity supported;During functional activity;Left upper extremity supported  End of Session PT - End of Session Equipment Utilized During Treatment: Gait belt Activity Tolerance: Patient tolerated treatment well Patient left: in chair;with call bell/phone within reach Nurse Communication: Mobility status CPM Right Knee CPM Right Knee: On Right Knee Flexion (Degrees): 90 Right Knee Extension (Degrees): 0   GP     Lara Mulch 10/07/2013, 9:23 AM  Verdell Face, PTA [phone] 10/07/2013

## 2013-10-07 NOTE — Evaluation (Signed)
Occupational Therapy Evaluation Patient Details Name: Carl Alvarez MRN: [id number] DOB: 06-24-1946 Today's Date: 10/07/2013 Time: 9528-4132 OT Time Calculation (min): 28 min  OT Assessment / Plan / Recommendation History of present illness Pt is a 67 y/o male admitted s/p R TKA on 10/06/13.   Clinical Impression   Pt is a 67 y/o male s/p R TKA, he currently presents at supervision level for safe transfers during ADL's and functional mobility tasks. He was educated on home safety/set-up and demonstrated Mod I LB dressing sitting w/o a/e & performed simulated tub transfer w/ Min guard assist. Pt declined 3:1 for home stating "I don't need it". He will benefit from 24/supervision level assist for safety. He reports no further acute OT needs, will sign off.    OT Assessment  Patient does not need any further OT services    Follow Up Recommendations  No OT follow up    Barriers to Discharge      Equipment Recommendations  None recommended by OT (Pt declines OT recommendation of 3:1 for home use "I don't need it")    Recommendations for Other Services    Frequency       Precautions / Restrictions Precautions Precautions: Fall;Knee Restrictions Weight Bearing Restrictions: Yes RLE Weight Bearing: Weight bearing as tolerated   Pertinent Vitals/Pain 5/10, see flow sheet for 8:00 am.    ADL  Eating/Feeding: Performed;Independent Where Assessed - Eating/Feeding: Bed level Grooming: Performed;Wash/dry hands;Wash/dry face;Teeth care;Supervision/safety (Standing at sink) Where Assessed - Grooming: Supported standing;Unsupported standing Upper Body Bathing: Performed;Chest;Left arm;Right arm;Supervision/safety Where Assessed - Upper Body Bathing: Unsupported standing Lower Body Bathing: Simulated;Supervision/safety Where Assessed - Lower Body Bathing: Unsupported sitting Upper Body Dressing: Performed;Modified independent;Set up Where Assessed - Upper Body Dressing: Unsupported  sitting Lower Body Dressing: Performed;Modified independent (Pt don/doffed socks in sitting w/o a/e) Where Assessed - Lower Body Dressing: Supported sit to stand Toilet Transfer: Research scientist (life sciences) Method: Sit to Barista: Comfort height toilet Toileting - Architect and Hygiene: Performed;Supervision/safety Where Assessed - Engineer, mining and Hygiene: Sit to stand from 3-in-1 or toilet;Standing Tub/Shower Transfer: Simulated;Supervision/safety;Min guard (Simulated tub transfer backing up/stepping over lf foam roll/towel rolls in room) Tub/Shower Transfer Method: Ambulating Equipment Used: Rolling walker Transfers/Ambulation Related to ADLs: Pt overall supervision and vc's for safety/hand placement w/ RW ADL Comments: Pt was educated in role of OT. He participated in ADL retraining session for simulated tub transfer, grooming standing at sink and LB dressing sit to stand w/o a/e. Discussed safety at home, removing throw rugs, assist for tub transfers etc. Pt declines 3:1 and states that he will have family PRN assist  for ADL's at d/c. He reports no further acute OT needs at this time.    OT Diagnosis:    OT Problem List:   OT Treatment Interventions:     OT Goals(Current goals can be found in the care plan section) Acute Rehab OT Goals Patient Stated Goal: To return home later today  Visit Information  Last OT Received On: 10/07/13 History of Present Illness: Pt is a 67 y/o male admitted s/p R TKA on 10/06/13.       Prior Functioning     Home Living Family/patient expects to be discharged to:: Private residence Living Arrangements: Alone Available Help at Discharge: Family;Available 24 hours/day Type of Home: House Home Access: Stairs to enter [employer] of Steps: 3 Entrance Stairs-Rails: Right Home Layout: One level Home Equipment: None Prior Function Level of Independence:  Independent Communication Communication:  No difficulties Dominant Hand: Right    Vision/Perception Vision - History Baseline Vision: Wears glasses only for reading Patient Visual Report: No change from baseline   Cognition  Cognition Arousal/Alertness: Awake/alert Behavior During Therapy: WFL for tasks assessed/performed Overall Cognitive Status: Within Functional Limits for tasks assessed    Extremity/Trunk Assessment Upper Extremity Assessment Upper Extremity Assessment: Overall WFL for tasks assessed Lower Extremity Assessment Lower Extremity Assessment: Defer to PT evaluation RLE Deficits / Details: Decreased strength and AROM consistent with R TKA RLE: Unable to fully assess due to pain    Mobility Bed Mobility Bed Mobility: Supine to Sit;Sitting - Scoot to Edge of Bed Supine to Sit: HOB elevated;5: Supervision Sitting - Scoot to Edge of Bed: 6: Modified independent (Device/Increase time) Details for Bed Mobility Assistance: VC's for safety/hand placement w/ RW as pt was noted to attempt to pull up on RW vs push up from EOB. Transfers Transfers: Sit to Stand;Stand to Sit Sit to Stand: 5: Supervision;From bed;From chair/3-in-1;From toilet;With upper extremity assist Stand to Sit: 5: Supervision;With upper extremity assist;To chair/3-in-1;To toilet;With armrests Details for Transfer Assistance: VC's for hand placement on seated surface and for safety awareness as pt is somewhat impulsive.        Balance Balance Balance Assessed: Yes Static Sitting Balance Static Sitting - Balance Support: No upper extremity supported;Feet supported Static Standing Balance Static Standing - Balance Support: No upper extremity supported;During functional activity;Left upper extremity supported   End of Session OT - End of Session Equipment Utilized During Treatment: Gait belt;Rolling walker Activity Tolerance: Patient tolerated treatment well Patient left: in chair;with call bell/phone  within reach Nurse Communication: Mobility status   GO     Roselie Awkward Dixon 10/07/2013, 8:40 AM

## 2013-10-07 NOTE — Progress Notes (Signed)
RN reviewed discharge instructions with patient and patients son. All questions answered. Prescriptions and paperwork given to patient.     Patient rolled down with NT and RN to car

## 2013-10-07 NOTE — Care Management Note (Signed)
CARE MANAGEMENT NOTE 10/07/2013  Patient:  Carl Alvarez, Carl Alvarez   Account Number:  [id number]  Date Initiated:  10/06/2013  Documentation initiated by:  Vance Peper  Subjective/Objective Assessment:   66 yr old male s/p right total knee arthroplasty.     Action/Plan:   PT/OT eval  [facility]  CM spooke with patient concerning HH and DME needs. No changes with HH. Patient has family support at discharge.   Anticipated DC Date:  10/07/2013   Anticipated DC Plan:  HOME W HOME HEALTH SERVICES      DC Planning Services  CM consult      PAC Choice  DURABLE MEDICAL EQUIPMENT  HOME HEALTH   Choice offered to / List presented to:  C-1 Patient   DME arranged  3-N-1  CPM      DME agency  [employer]     HH arranged  HH-2 PT      [facility] agency  [facility]   Status of service:  Completed, signed off   Discharge Disposition:  HOME W HOME HEALTH SERVICES

## 2013-10-09 ENCOUNTER — Emergency Department: Payer: Self-pay | Admitting: Emergency Medicine

## 2013-10-09 LAB — COMPREHENSIVE METABOLIC PANEL
Albumin: 2.6 g/dL — ABNORMAL LOW (ref 3.4–5.0)
Alkaline Phosphatase: 57 U/L
Anion Gap: 4 — ABNORMAL LOW (ref 7–16)
BUN: 11 mg/dL (ref 7–18)
Bilirubin,Total: 0.7 mg/dL (ref 0.2–1.0)
Calcium, Total: 8.4 mg/dL — ABNORMAL LOW (ref 8.5–10.1)
Chloride: 103 mmol/L (ref 98–107)
Co2: 32 mmol/L (ref 21–32)
Creatinine: 1.2 mg/dL (ref 0.60–1.30)
EGFR (African American): >60
EGFR (Non-African Amer.): >60
Glucose: 178 mg/dL — ABNORMAL HIGH (ref 65–99)
Osmolality: 281 (ref 275–301)
Potassium: 3.1 mmol/L — ABNORMAL LOW (ref 3.5–5.1)
SGOT(AST): 28 U/L (ref 15–37)
SGPT (ALT): 38 U/L (ref 12–78)
Sodium: 139 mmol/L (ref 136–145)
Total Protein: 5.7 g/dL — ABNORMAL LOW (ref 6.4–8.2)

## 2013-10-09 LAB — PRO B NATRIURETIC PEPTIDE: B-Type Natriuretic Peptide: 2510 pg/mL — ABNORMAL HIGH (ref 0–125)

## 2013-10-09 LAB — TSH: Thyroid Stimulating Horm: <0.01 u[IU]/mL — ABNORMAL LOW

## 2013-10-09 LAB — CBC
HCT: 24.6 % — ABNORMAL LOW (ref 40.0–52.0)
HGB: 8.2 g/dL — ABNORMAL LOW (ref 13.0–18.0)
MCH: 31.6 pg (ref 26.0–34.0)
MCHC: 33.3 g/dL (ref 32.0–36.0)
MCV: 95 fL (ref 80–100)
Platelet: 122 10*3/uL — ABNORMAL LOW (ref 150–440)
RBC: 2.59 10*6/uL — ABNORMAL LOW (ref 4.40–5.90)
RDW: 12.8 % (ref 11.5–14.5)
WBC: 11.7 10*3/uL — ABNORMAL HIGH (ref 3.8–10.6)

## 2013-10-09 LAB — CK TOTAL AND CKMB (NOT AT ARMC)
CK, Total: 261 U/L — ABNORMAL HIGH (ref 35–232)
CK-MB: <0.5 ng/mL — ABNORMAL LOW (ref 0.5–3.6)

## 2013-10-09 LAB — MAGNESIUM: Magnesium: 2.2 mg/dL

## 2013-10-09 LAB — TROPONIN I: Troponin-I: <0.02 ng/mL

## 2013-10-09 LAB — T4, FREE: Free Thyroxine: 1.9 ng/dL — ABNORMAL HIGH

## 2013-10-10 ENCOUNTER — Telehealth: Payer: Self-pay

## 2013-10-10 NOTE — Telephone Encounter (Signed)
Spoke w/ pt.  He reports that he is not taking any Pradaxa, as he is on Lovenox injections. He reports that his HR has been "normal" but he does not know what the number is.  Spoke w/ Dr. Mariah Milling who instructed pt to d/c Lovenox today and start Pradaxa 150mg  BID. Left samples of Pradaxa 150mg  at the front desk for pt to pick up. Pt sched to see Dr. Lanny Cramp am at 8:00.

## 2013-10-10 NOTE — Telephone Encounter (Signed)
Left message for pt to call back regarding d/c from hospital.  Need to speak w/ pt to sched f/u w/ Dr. Mariah Milling, ask if pt is taking Pradaxa and if so,for how long, and to find out pt's heart rate.

## 2013-10-11 ENCOUNTER — Emergency Department: Payer: Self-pay | Admitting: Emergency Medicine

## 2013-10-11 LAB — BASIC METABOLIC PANEL WITH GFR
Anion Gap: 3 — ABNORMAL LOW (ref 7–16)
BUN: 11 mg/dL (ref 7–18)
Calcium, Total: 8.3 mg/dL — ABNORMAL LOW (ref 8.5–10.1)
Chloride: 103 mmol/L (ref 98–107)
Co2: 34 mmol/L — ABNORMAL HIGH (ref 21–32)
Creatinine: 1 mg/dL (ref 0.60–1.30)
EGFR (African American): >60
EGFR (Non-African Amer.): >60
Glucose: 114 mg/dL — ABNORMAL HIGH (ref 65–99)
Osmolality: 280 (ref 275–301)
Potassium: 3 mmol/L — ABNORMAL LOW (ref 3.5–5.1)
Sodium: 140 mmol/L (ref 136–145)

## 2013-10-11 LAB — CBC
HCT: 23.7 % — ABNORMAL LOW (ref 40.0–52.0)
HGB: 8 g/dL — ABNORMAL LOW (ref 13.0–18.0)
MCH: 32 pg (ref 26.0–34.0)
MCHC: 33.9 g/dL (ref 32.0–36.0)
MCV: 94 fL (ref 80–100)
Platelet: 181 x10 3/mm 3 (ref 150–440)
RBC: 2.51 x10 6/mm 3 — ABNORMAL LOW (ref 4.40–5.90)
RDW: 13 % (ref 11.5–14.5)
WBC: 9.9 x10 3/mm 3 (ref 3.8–10.6)

## 2013-10-11 LAB — PROTIME-INR
INR: 1.2
Prothrombin Time: 15.3 s — ABNORMAL HIGH (ref 11.5–14.7)

## 2013-10-13 ENCOUNTER — Encounter: Payer: Self-pay | Admitting: Cardiovascular Disease

## 2013-10-13 ENCOUNTER — Encounter (INDEPENDENT_AMBULATORY_CARE_PROVIDER_SITE_OTHER): Payer: Self-pay

## 2013-10-13 ENCOUNTER — Ambulatory Visit (INDEPENDENT_AMBULATORY_CARE_PROVIDER_SITE_OTHER): Payer: Medicare Other | Admitting: Cardiovascular Disease

## 2013-10-13 VITALS — BP 140/70 | HR 86 | Ht 71.0 in | Wt 188.5 lb

## 2013-10-13 DIAGNOSIS — I4892 Unspecified atrial flutter: Secondary | ICD-10-CM | POA: Insufficient documentation

## 2013-10-13 DIAGNOSIS — F17200 Nicotine dependence, unspecified, uncomplicated: Secondary | ICD-10-CM

## 2013-10-13 DIAGNOSIS — I4891 Unspecified atrial fibrillation: Secondary | ICD-10-CM

## 2013-10-13 DIAGNOSIS — Z96659 Presence of unspecified artificial knee joint: Secondary | ICD-10-CM

## 2013-10-13 DIAGNOSIS — I10 Essential (primary) hypertension: Secondary | ICD-10-CM

## 2013-10-13 DIAGNOSIS — Z96651 Presence of right artificial knee joint: Secondary | ICD-10-CM

## 2013-10-13 NOTE — Progress Notes (Signed)
Patient ID: Carl Alvarez, male    DOB: 09-27-1946, 67 y.o.   MRN: [id number]  HPI Comments:  Carl Alvarez is a pleasant 67 yo gentleman with long history of smoking, hypertension, hyperlipidemia who presented to [facility] on November 22 2011 with right upper extremity tingling and right facial numbness, felt to have a possible TIA, found to be in atrial fibrillation.  He was started on low-dose beta blocker, pradaxa 150 mg b.i.d., DC cardioversion early April 2013 He  stopped smoking since he left the hospital, though reports he has recently restarted smoking again. CT Scan of the head did not show any significant stroke Chest x-ray was clear apart from hyperinflation consistent with COPD Echocardiogram November 22, 2011 shows ejection fraction 50-55%, otherwise normal study  Followup EKG showed atrial flutter. He was started on amiodarone in an attempt to cardiovert pharmacologically. He remained in atrial flutter and underwent DC cardioversion early April 2013 which was successful.  In followup today, he reports having total knee replacement on the right one week ago 10/07/2013. He went home on December 17 and started physical therapy. He reports having abnormal rhythm per the PT on December 17 and was sent to the emergency room for further evaluation. EKG in the ER 10/09/2013 showed atrial flutter. Lovenox was discontinued and he was started on pradaxa. He pick up samples from our office and did not start this until evening of December 19. Several hours later he noticed some ecchymosis on the posterior aspect of his leg. He had more swelling of his right thigh, or ecchymosis on December 22 and he went back to the emergency room. There had MRI showing hematoma. We'll unaware of this visit and he continued on his blood thinners over the next 2 days. Size of his right thigh has been relatively stable over the past 2 days, discontinued ecchymosis. He reports knee feels fine, he is weightbearing, otherwise feels  well, no significant discomfort.  In terms of his arrhythmia, he denies any shortness of breath, leg edema, and is generally asymptomatic. Lab work from 10/09/2013 shows hematocrit 24.6, potassium 3.1, BNP 2500, albumin 2.6, ultrasound of the right lower extremity with no DVT  EKG today shows atrial flutter with ventricular rate 86 beats per minute    Outpatient Encounter Prescriptions as of 10/13/2013  Medication Sig  . amiodarone (PACERONE) 200 MG tablet Take 1 tablet (200 mg total) by mouth daily.  [name] atorvastatin (LIPITOR) 20 MG tablet Take 1 tablet (20 mg total) by mouth daily.  . dabigatran (PRADAXA) 150 MG CAPS capsule Take 150 mg by mouth 2 (two) times daily.  [name] enoxaparin (LOVENOX) 40 MG/0.4ML injection Inject 0.4 mLs (40 mg total) into the skin daily.  [name] ibuprofen (ADVIL,MOTRIN) 200 MG tablet Take 200 mg by mouth every 6 (six) hours as needed for headache or moderate pain.   [name] lisinopril (PRINIVIL,ZESTRIL) 20 MG tablet Take 1 tablet (20 mg total) by mouth daily.  . meclizine (ANTIVERT) 25 MG tablet Take 25 mg by mouth as needed for dizziness or nausea.   . meloxicam (MOBIC) 15 MG tablet Take 15 mg by mouth daily.   . methocarbamol (ROBAXIN) 500 MG tablet Take 1-2 tablets (500-1,000 mg total) by mouth every 6 (six) hours as needed for muscle spasms.  . metoprolol tartrate (LOPRESSOR) 25 MG tablet Take 37.5 mg by mouth 2 (two) times daily.  . Multiple Vitamin (MULTIVITAMIN) tablet Take 1 tablet by mouth daily.  [name] oxyCODONE (OXY IR/ROXICODONE) 5 MG immediate release tablet  Take 1-2 tablets (5-10 mg total) by mouth every 3 (three) hours as needed for breakthrough pain.    Review of Systems  Constitutional: Negative.   HENT: Negative.   Eyes: Negative.   Respiratory: Negative.   Cardiovascular: Negative.   Gastrointestinal: Negative.   Endocrine: Negative.   Musculoskeletal: Positive for arthralgias and joint swelling.  Skin: Negative.   Allergic/Immunologic: Negative.    Neurological: Negative.   Hematological: Negative.   Psychiatric/Behavioral: Positive for sleep disturbance.  All other systems reviewed and are negative.    BP 140/70  Pulse 86  Ht 5\' 11"  (1.803 m)  Wt 188 lb 8 oz (85.503 kg)  BMI 26.30 kg/m2  Physical Exam  Nursing note and vitals reviewed. Constitutional: He is oriented to person, place, and time. He appears well-developed and well-nourished.  HENT:  Head: Normocephalic.  Nose: Nose normal.  Mouth/Throat: Oropharynx is clear and moist.  Eyes: Conjunctivae are normal. Pupils are equal, round, and reactive to light.  Neck: Normal range of motion. Neck supple. No JVD present.  Cardiovascular: Normal rate, regular rhythm, S1 normal, S2 normal, normal heart sounds and intact distal pulses.  Exam reveals no gallop and no friction rub.   No murmur heard. Pulmonary/Chest: Effort normal and breath sounds normal. No respiratory distress. He has no wheezes. He has no rales. He exhibits no tenderness.  Abdominal: Soft. Bowel sounds are normal. He exhibits no distension. There is no tenderness.  Musculoskeletal: Normal range of motion. He exhibits no edema and no tenderness.  Lymphadenopathy:    He has no cervical adenopathy.  Neurological: He is alert and oriented to person, place, and time. Coordination normal.  Skin: Skin is warm and dry. No rash noted. No erythema.  Psychiatric: He has a normal mood and affect. His behavior is normal. Judgment and thought content normal.      Assessment and Plan

## 2013-10-13 NOTE — Patient Instructions (Signed)
You are doing well. No medication changes were made.  Please stop the pradaxa for one week Then restart pradaxa next Monday 12/29 At the end of January, if you are still in atrial flutter, Will will do a cardioversion  Please call us if you have new issues that need to be addressed before your next appt.  Your physician wants you to follow-up in: 5 week

## 2013-10-13 NOTE — Assessment & Plan Note (Signed)
Appears to be doing well, we have been well. No complaints. He has followup with Dr. Valentina Gu tomorrow.

## 2013-10-13 NOTE — Assessment & Plan Note (Addendum)
Emergency room notes from December 18 and December 22 were reviewed. Labs reviewed  Ventricular rate currently well controlled. Given his right thigh hematoma over the past  3 days, we will hold his anticoagulation for one week and then restart  pradaxa one week from now twice a day dosing in preparation for possible cardioversion at the end of January if he remains in atrial flutter .

## 2013-10-13 NOTE — Assessment & Plan Note (Signed)
We have encouraged him to continue to work on weaning his cigarettes and smoking cessation. He will continue to work on this and does not want any assistance with chantix.  

## 2013-10-13 NOTE — Assessment & Plan Note (Signed)
Blood pressure is well controlled on today's visit. No changes made to the medications. 

## 2013-10-20 ENCOUNTER — Telehealth: Payer: Self-pay | Admitting: *Deleted

## 2013-10-20 NOTE — Telephone Encounter (Signed)
Started taking Pradaxa 150mg   this morning and its making his heart race, Please advise.

## 2013-10-20 NOTE — Telephone Encounter (Signed)
Spoke w/ pt.  He reports that he took his Pradaxa around 5 am and now his "heart is racing". Asked pt what his heart rate is but he states that he does not know how to check this and does not have a BP monitor at home. Instructed pt how to check his heart rate.  He reports that it is 60 bpm. After discussing normal parameters for hr w/ pt, he admits that he recently "had some issues w/ my leg" and has "been wound up".  Pt states the he "just needs to relax and try the medicine for a little while".   Advised pt to see about obtaining a BP monitor that can check his pulse for him.  He states that he would like to practice checking it himself, will monitor for a few days and call back with any questions or concerns.

## 2013-10-21 NOTE — Discharge Summary (Signed)
SPORTS MEDICINE & JOINT REPLACEMENT   Georgena Spurling, MD   Altamese Cabal, PA-C [address] [place], [place], [place]  [address]                             [phone]  PATIENT ID: Carl Alvarez        MRN:  [id number]          DOB/AGE: 68-Oct-1947 / 68 y.o.    DISCHARGE SUMMARY  ADMISSION DATE:    10/06/2013 DISCHARGE DATE:   10/07/2013  ADMISSION DIAGNOSIS: osteoarthritis right knee    DISCHARGE DIAGNOSIS:  osteoarthritis right knee    ADDITIONAL DIAGNOSIS: Active Problems:   Osteoarthritis of right knee  Past Medical History  Diagnosis Date  . Hyperlipidemia   . Shoulder pain, bilateral   . Hypernatremia   . Vertigo   . Hypertension   . Allergy   . Arthritis     Right Knee  . Heart murmur   . GERD (gastroesophageal reflux disease)     denies  . Arrhythmia     new onset a-fib s/p cardioversion 01/2012 [facility] Dr. Mariah Milling    PROCEDURE: Procedure(s): TOTAL KNEE ARTHROPLASTY on 10/06/2013  CONSULTS:     HISTORY:  See H&P in chart  HOSPITAL COURSE:  Carl Alvarez is a 67 y.o. admitted on 10/06/2013 and found to have a diagnosis of osteoarthritis right knee.  After appropriate laboratory studies were obtained  they were taken to the operating room on 10/06/2013 and underwent Procedure(s): TOTAL KNEE ARTHROPLASTY.   They were given perioperative antibiotics:  Anti-infectives   Start     Dose/Rate Route Frequency Ordered Stop   10/06/13 2200  vancomycin (VANCOCIN) IVPB 1000 mg/200 mL premix     1,000 mg 200 mL/hr over 60 Minutes Intravenous Every 12 hours 10/06/13 1300 10/06/13 2305   10/06/13 0600  vancomycin (VANCOCIN) 1,500 mg in sodium chloride 0.9 % 500 mL IVPB     1,500 mg 250 mL/hr over 120 Minutes Intravenous On call to O.R. 10/05/13 1436 10/06/13 1001    .  Tolerated the procedure well.  Placed with a foley intraoperatively.  Given Ofirmev at induction and for 48 hours.    POD# 1: Vital signs were stable.  Patient denied Chest pain, shortness of breath,  or calf pain.  Patient was started on Lovenox 30 mg subcutaneously twice daily at 8am.  Consults to PT, OT, and care management were made.  The patient was weight bearing as tolerated.  CPM was placed on the operative leg 0-90 degrees for 6-8 hours a day.  Incentive spirometry was taught.  Dressing was changed.  Marcaine pump and hemovac were discontinued.      POD #2, Continued  PT for ambulation and exercise program.  IV saline locked.  O2 discontinued.    The remainder of the hospital course was dedicated to ambulation and strengthening.   The patient was discharged on 1 day post op in  Good condition.  Blood products given:none  DIAGNOSTIC STUDIES: Recent vital signs: No data found.      Recent laboratory studies: No results found for this basename: WBC, HGB, HCT, PLT,  in the last 168 hours No results found for this basename: NA, K, CL, CO2, BUN, CREATININE, GLUCOSE, CALCIUM,  in the last 168 hours Lab Results  Component Value Date   INR 1.01 09/26/2013   INR 1.2 01/22/2012     Recent Radiographic Studies :  Dg  Chest 2 View  09/26/2013   CLINICAL DATA:  Preoperative for right knee arthroplasty  EXAM: CHEST  2 VIEW  COMPARISON:  None.  FINDINGS: The lungs are mildly hyperinflated and clear. The cardiopericardial silhouette is normal in size. The pulmonary vascularity is not engorged. The mediastinum is normal in width. There is mild tortuosity of the descending thoracic aorta. There is no pleural effusion or pneumothorax. The observed portions of the bony thorax exhibit no acute abnormalities.  IMPRESSION: There is hyperinflation which may be voluntary or could reflect underlying COPD. There is no evidence of pneumonia nor CHF.   Electronically Signed   By: David  [place]   On: 09/26/2013 10:05    DISCHARGE INSTRUCTIONS: Discharge Orders   Future Appointments Provider Department Dept Phone   11/13/2013 10:30 AM Antonieta Iba, MD [facility] [phone]   Future  Orders Complete By Expires   Call MD / Call 911  As directed    Comments:     If you experience chest pain or shortness of breath, CALL 911 and be transported to the hospital emergency room.  If you develope a fever above 101 F, pus (white drainage) or increased drainage or redness at the wound, or calf pain, call your surgeon's office.   Change dressing  As directed    Comments:     Change dressing on wednesday, then change the dressing daily with sterile 4 x 4 inch gauze dressing and apply TED hose.   Constipation Prevention  As directed    Comments:     Drink plenty of fluids.  Prune juice may be helpful.  You may use a stool softener, such as Colace (over the counter) 100 mg twice a day.  Use MiraLax (over the counter) for constipation as needed.   CPM  As directed    Comments:     Continuous passive motion machine (CPM):      Use the CPM from 0 to 90 for 6-8 hours per day.      You may increase by 10 per day.  You may break it up into 2 or 3 sessions per day.      Use CPM for 2 weeks or until you are told to stop.   Diet - low sodium heart healthy  As directed    Do not put a pillow under the knee. Place it under the heel.  As directed    Driving restrictions  As directed    Comments:     No driving for 6 weeks   Increase activity slowly as tolerated  As directed    Lifting restrictions  As directed    Comments:     No lifting for 6 weeks   TED hose  As directed    Comments:     Use stockings (TED hose) for 3 weeks on both leg(s).  You may remove them at night for sleeping.      DISCHARGE MEDICATIONS:     Medication List    STOP taking these medications       aspirin 81 MG tablet     fluticasone 50 MCG/ACT nasal spray  Commonly known as:  FLONASE      TAKE these medications       amiodarone 200 MG tablet  Commonly known as:  PACERONE  Take 1 tablet (200 mg total) by mouth daily.     atorvastatin 20 MG tablet  Commonly known as:  LIPITOR  Take 1 tablet (20 mg  total) by mouth daily.     enoxaparin 40 MG/0.4ML injection  Commonly known as:  LOVENOX  Inject 0.4 mLs (40 mg total) into the skin daily.     ibuprofen 200 MG tablet  Commonly known as:  ADVIL,MOTRIN  Take 200 mg by mouth every 6 (six) hours as needed for headache or moderate pain.     lisinopril 20 MG tablet  Commonly known as:  PRINIVIL,ZESTRIL  Take 1 tablet (20 mg total) by mouth daily.     meclizine 25 MG tablet  Commonly known as:  ANTIVERT  Take 25 mg by mouth as needed for dizziness or nausea.     methocarbamol 500 MG tablet  Commonly known as:  ROBAXIN  Take 1-2 tablets (500-1,000 mg total) by mouth every 6 (six) hours as needed for muscle spasms.     metoprolol tartrate 25 MG tablet  Commonly known as:  LOPRESSOR  Take 37.5 mg by mouth 2 (two) times daily.     multivitamin tablet  Take 1 tablet by mouth daily.     oxyCODONE 5 MG immediate release tablet  Commonly known as:  Oxy IR/ROXICODONE  Take 1-2 tablets (5-10 mg total) by mouth every 3 (three) hours as needed for breakthrough pain.        FOLLOW UP VISIT:       Follow-up Information   Follow up with Raymon Mutton, MD. Call on 10/21/2013.   Specialty:  Orthopedic Surgery   Contact information:   [address] [place] [address] [phone]       DISPOSITION: HOME   CONDITION:  Good   Mathieu Schloemer 10/21/2013, 12:00 PM

## 2013-10-23 ENCOUNTER — Telehealth: Payer: Self-pay | Admitting: Nurse Practitioner

## 2013-10-23 ENCOUNTER — Other Ambulatory Visit: Payer: Self-pay | Admitting: Nurse Practitioner

## 2013-10-23 MED ORDER — METOPROLOL TARTRATE 25 MG PO TABS: 37.5000 mg | ORAL_TABLET | Freq: Two times a day (BID) | ORAL | Status: DC

## 2013-10-23 NOTE — Telephone Encounter (Signed)
I received a call this AM from Toney Sang, a personal friend of patient Carl Alvarez.  He has run out of his metoprolol and needs a refill sent in.  His usual pharmacy is closed for the next 2 day and he would like it sent to [facility] [place].  I reviewed his chart and sent in a Rx for metoprolol 37.5mg  bid, # 90, 6 refills [facility] [place].

## 2013-11-05 ENCOUNTER — Telehealth: Payer: Self-pay | Admitting: *Deleted

## 2013-11-05 NOTE — Telephone Encounter (Signed)
Does this patient need to continue his blood thinner? Please advise

## 2013-11-05 NOTE — Telephone Encounter (Signed)
Spoke w/ pt.  Advised him of Dr. Donivan Scull instructions on his last ov:  "Please stop the pradaxa for one week  Then restart pradaxa next Monday 12/29  At the end of January, if you are still in atrial flutter,  Will will do a cardioversion"  Advised pt to stay on pradaxa until the end of the month.  He reports that he has enough to last him until then. Advised pt to call us at that time if he still feels his heart if is jumping around and we will take the next step. Pt verbalizes understanding and is agreeable to this.

## 2013-11-07 ENCOUNTER — Ambulatory Visit: Payer: Medicare Other | Admitting: Cardiovascular Disease

## 2013-11-13 ENCOUNTER — Ambulatory Visit: Payer: Medicare Other | Admitting: Cardiovascular Disease

## 2013-11-20 ENCOUNTER — Ambulatory Visit: Payer: Medicare Other | Admitting: Cardiovascular Disease

## 2013-11-24 ENCOUNTER — Encounter: Payer: Self-pay | Admitting: Cardiovascular Disease

## 2013-11-24 ENCOUNTER — Ambulatory Visit (INDEPENDENT_AMBULATORY_CARE_PROVIDER_SITE_OTHER): Payer: Medicare Other | Admitting: Cardiovascular Disease

## 2013-11-24 ENCOUNTER — Other Ambulatory Visit: Payer: Self-pay

## 2013-11-24 VITALS — BP 160/90 | HR 98 | Ht 71.0 in | Wt 185.2 lb

## 2013-11-24 DIAGNOSIS — I4892 Unspecified atrial flutter: Secondary | ICD-10-CM

## 2013-11-24 DIAGNOSIS — R946 Abnormal results of thyroid function studies: Secondary | ICD-10-CM

## 2013-11-24 DIAGNOSIS — Z01812 Encounter for preprocedural laboratory examination: Secondary | ICD-10-CM

## 2013-11-24 DIAGNOSIS — R7989 Other specified abnormal findings of blood chemistry: Secondary | ICD-10-CM

## 2013-11-24 DIAGNOSIS — F17200 Nicotine dependence, unspecified, uncomplicated: Secondary | ICD-10-CM

## 2013-11-24 DIAGNOSIS — E785 Hyperlipidemia, unspecified: Secondary | ICD-10-CM

## 2013-11-24 DIAGNOSIS — I10 Essential (primary) hypertension: Secondary | ICD-10-CM

## 2013-11-24 DIAGNOSIS — I4891 Unspecified atrial fibrillation: Secondary | ICD-10-CM

## 2013-11-24 NOTE — Assessment & Plan Note (Signed)
Blood pressure is well controlled on today's visit. No changes made to the medications. 

## 2013-11-24 NOTE — Assessment & Plan Note (Signed)
We will schedule him for cardioversion this week at his request. He is not interested in ablation at this time. Several weeks after his cardioversion, we will stop his amiodarone, continue other medications. If he holds normal rhythm, could hold his anticoagulation as well

## 2013-11-24 NOTE — Assessment & Plan Note (Signed)
Reports that he stop smoking since his last clinic visit

## 2013-11-24 NOTE — Progress Notes (Signed)
Patient ID: Carl Alvarez, male    DOB: 1945-12-21, 68 y.o.   MRN: [id number]  HPI Comments:  [name] is a pleasant 68 yo gentleman with long history of smoking, hypertension, hyperlipidemia who presented to [facility] on November 22 2011 with right upper extremity tingling and right facial numbness, felt to have a possible TIA, found to be in atrial fibrillation.  He was started on low-dose beta blocker, pradaxa 150 mg b.i.d., DC cardioversion early April 2013 He  stopped smoking since he left the hospital, though reports he has recently restarted smoking again. CT Scan of the head did not show any significant stroke Chest x-ray was clear apart from hyperinflation consistent with COPD Echocardiogram November 22, 2011 shows ejection fraction 50-55%, otherwise normal study  Followup EKG showed atrial flutter. He was started on amiodarone in an attempt to cardiovert pharmacologically. He remained in atrial flutter and underwent DC cardioversion early April 2013 which was successful.  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 He went home on December 17 with no pain medication. He had 48 hours of excruciating knee pain before restarting pain medication. He started physical therapy. He reports having abnormal rhythm per the PT on December 17 and was sent to the emergency room for further evaluation. EKG in the ER 10/09/2013 showed atrial flutter. Lovenox was discontinued and he was started on pradaxa. He pick up samples from our office and did not start this until evening of December 19. Several hours later he noticed some ecchymosis on the posterior aspect of his leg. He had more swelling of his right thigh, or ecchymosis on December 22 and he went back to the emergency room. There had MRI showing hematoma. We were unaware of this visit and he continued on his blood thinners over the next 2 days. Size of his right thigh had been relatively stable.   On his previous clinic visit, we held his  anticoagulation for one week and then restarted pradaxa 150 mg twice a day. This was over one month ago, at the end of December 2014 In terms of his arrhythmia, he does report having some fatigue and would like a cardioversion. Ablation was discussed but he prefers cardioversion.  Lab work from 10/09/2013 shows hematocrit 24.6, potassium 3.1, BNP 2500, albumin 2.6, ultrasound of the right lower extremity with no DVT Recent lab work shows mild abnormality of his thyroid numbers, T4 done by Dr. Othelia Pulling EKG today shows atrial flutter with ventricular rate 98 beats per minute    Outpatient Encounter Prescriptions as of 11/24/2013  Medication Sig  . amiodarone (PACERONE) 200 MG tablet Take 1 tablet (200 mg total) by mouth daily.  [name] atorvastatin (LIPITOR) 20 MG tablet Take 1 tablet (20 mg total) by mouth daily.  . dabigatran (PRADAXA) 150 MG CAPS capsule Take 150 mg by mouth 2 (two) times daily.  [name] ibuprofen (ADVIL,MOTRIN) 200 MG tablet Take 200 mg by mouth every 6 (six) hours as needed for headache or moderate pain.   [name] lisinopril (PRINIVIL,ZESTRIL) 20 MG tablet Take 1 tablet (20 mg total) by mouth daily.  . meclizine (ANTIVERT) 25 MG tablet Take 25 mg by mouth as needed for dizziness or nausea.   . meloxicam (MOBIC) 15 MG tablet Take 15 mg by mouth daily.   . methocarbamol (ROBAXIN) 500 MG tablet Take 1-2 tablets (500-1,000 mg total) by mouth every 6 (six) hours as needed for muscle spasms.  . metoprolol tartrate (LOPRESSOR) 25 MG tablet Take 1.5 tablets (  37.5 mg total) by mouth 2 (two) times daily.  . Multiple Vitamin (MULTIVITAMIN) tablet Take 1 tablet by mouth daily.  [name] oxyCODONE (OXY IR/ROXICODONE) 5 MG immediate release tablet Take 1-2 tablets (5-10 mg total) by mouth every 3 (three) hours as needed for breakthrough pain.  . [DISCONTINUED] enoxaparin (LOVENOX) 40 MG/0.4ML injection Inject 0.4 mLs (40 mg total) into the skin daily.    Review of Systems  Constitutional: Negative.   HENT:  Negative.   Eyes: Negative.   Respiratory: Negative.   Cardiovascular: Negative.   Gastrointestinal: Negative.   Endocrine: Negative.   Musculoskeletal: Positive for arthralgias and joint swelling.  Skin: Negative.   Allergic/Immunologic: Negative.   Neurological: Negative.   Hematological: Negative.   Psychiatric/Behavioral: Positive for sleep disturbance.  All other systems reviewed and are negative.    BP 160/90  Pulse 98  Ht 5\' 11"  (1.803 m)  Wt 185 lb 4 oz (84.029 kg)  BMI 25.85 kg/m2  Physical Exam  Nursing note and vitals reviewed. Constitutional: He is oriented to person, place, and time. He appears well-developed and well-nourished.  HENT:  Head: Normocephalic.  Nose: Nose normal.  Mouth/Throat: Oropharynx is clear and moist.  Eyes: Conjunctivae are normal. Pupils are equal, round, and reactive to light.  Neck: Normal range of motion. Neck supple. No JVD present.  Cardiovascular: Normal rate, S1 normal, S2 normal, normal heart sounds and intact distal pulses.  An irregularly irregular rhythm present. Exam reveals no gallop and no friction rub.   No murmur heard. Pulmonary/Chest: Effort normal and breath sounds normal. No respiratory distress. He has no wheezes. He has no rales. He exhibits no tenderness.  Abdominal: Soft. Bowel sounds are normal. He exhibits no distension. There is no tenderness.  Musculoskeletal: Normal range of motion. He exhibits no edema and no tenderness.  Lymphadenopathy:    He has no cervical adenopathy.  Neurological: He is alert and oriented to person, place, and time. Coordination normal.  Skin: Skin is warm and dry. No rash noted. No erythema.  Psychiatric: He has a normal mood and affect. His behavior is normal. Judgment and thought content normal.      Assessment and Plan

## 2013-11-24 NOTE — Patient Instructions (Addendum)
You are doing well. No medication changes were made.  We will schedule a cardioversion for atrial flutter:  Tuesday, Feb 3 @ 7:30am, please arrive at the medical mall entrance of [facility] @ 6:30am  Your physician has recommended that you have a Cardioversion (DCCV). Electrical Cardioversion uses a jolt of electricity to your heart either through paddles or wired patches attached to your chest. This is a controlled, usually prescheduled, procedure. Defibrillation is done under light anesthesia in the hospital, and you usually go home the day of the procedure. This is done to get your heart back into a normal rhythm. You are not awake for the procedure. Please see the instruction sheet given to you today.  Please call us if you have new issues that need to be addressed before your next appt.  Your physician wants you to follow-up in: 2 weeks

## 2013-11-24 NOTE — Assessment & Plan Note (Signed)
Suggested he continue on his Lipitor.

## 2013-11-24 NOTE — Assessment & Plan Note (Signed)
Done by Dr. Othelia Pulling. Suggested that we perform cardioversion, restored normal sinus rhythm, then stop amiodarone. Would hope that abnormal thyroid numbers would return to normal.

## 2013-11-25 ENCOUNTER — Ambulatory Visit: Payer: Self-pay | Admitting: Cardiovascular Disease

## 2013-11-25 DIAGNOSIS — I4892 Unspecified atrial flutter: Secondary | ICD-10-CM

## 2013-11-25 LAB — CBC WITH DIFFERENTIAL
Basophils Absolute: 0 10*3/uL (ref 0.0–0.2)
Basos: 0 %
Eos: 3 %
Eosinophils Absolute: 0.2 10*3/uL (ref 0.0–0.4)
HCT: 38.5 % (ref 37.5–51.0)
Hemoglobin: 12.6 g/dL (ref 12.6–17.7)
Immature Grans (Abs): 0 10*3/uL (ref 0.0–0.1)
Immature Granulocytes: 0 %
Lymphocytes Absolute: 2.5 10*3/uL (ref 0.7–3.1)
Lymphs: 37 %
MCH: 32.1 pg (ref 26.6–33.0)
MCHC: 32.7 g/dL (ref 31.5–35.7)
MCV: 98 fL — ABNORMAL HIGH (ref 79–97)
Monocytes Absolute: 0.5 10*3/uL (ref 0.1–0.9)
Monocytes: 8 %
Neutrophils Absolute: 3.5 10*3/uL (ref 1.4–7.0)
Neutrophils Relative %: 52 %
Platelets: 215 10*3/uL (ref 150–379)
RBC: 3.92 x10E6/uL — ABNORMAL LOW (ref 4.14–5.80)
RDW: 13.6 % (ref 12.3–15.4)
WBC: 6.7 10*3/uL (ref 3.4–10.8)

## 2013-11-25 LAB — BASIC METABOLIC PANEL
BUN/Creatinine Ratio: 12 (ref 10–22)
BUN: 12 mg/dL (ref 8–27)
CO2: 25 mmol/L (ref 18–29)
Calcium: 9.7 mg/dL (ref 8.6–10.2)
Chloride: 103 mmol/L (ref 97–108)
Creatinine, Ser: 1.04 mg/dL (ref 0.76–1.27)
GFR calc Af Amer: 85 mL/min/{1.73_m2} (ref >59)
GFR calc non Af Amer: 73 mL/min/{1.73_m2} (ref >59)
Glucose: 113 mg/dL — ABNORMAL HIGH (ref 65–99)
Potassium: 4.6 mmol/L (ref 3.5–5.2)
Sodium: 143 mmol/L (ref 134–144)

## 2013-11-25 LAB — PROTIME-INR
INR: 1.2 (ref 0.8–1.2)
Prothrombin Time: 12.2 s — ABNORMAL HIGH (ref 9.1–12.0)

## 2013-11-28 ENCOUNTER — Telehealth: Payer: Self-pay | Admitting: *Deleted

## 2013-11-28 NOTE — Telephone Encounter (Signed)
Patient wanting samples of Pradaxa 150mg . Call patient when ready.

## 2013-11-28 NOTE — Telephone Encounter (Signed)
Placed samples Pradaxa 150 mg for pick up.

## 2013-12-01 ENCOUNTER — Ambulatory Visit (INDEPENDENT_AMBULATORY_CARE_PROVIDER_SITE_OTHER): Payer: Medicare Other | Admitting: Cardiovascular Disease

## 2013-12-01 ENCOUNTER — Encounter: Payer: Self-pay | Admitting: Cardiovascular Disease

## 2013-12-01 ENCOUNTER — Encounter (INDEPENDENT_AMBULATORY_CARE_PROVIDER_SITE_OTHER): Payer: Self-pay

## 2013-12-01 VITALS — BP 138/86 | HR 79 | Ht 71.0 in | Wt 184.8 lb

## 2013-12-01 DIAGNOSIS — I10 Essential (primary) hypertension: Secondary | ICD-10-CM

## 2013-12-01 DIAGNOSIS — R7989 Other specified abnormal findings of blood chemistry: Secondary | ICD-10-CM

## 2013-12-01 DIAGNOSIS — F17200 Nicotine dependence, unspecified, uncomplicated: Secondary | ICD-10-CM

## 2013-12-01 DIAGNOSIS — I4892 Unspecified atrial flutter: Secondary | ICD-10-CM

## 2013-12-01 DIAGNOSIS — R946 Abnormal results of thyroid function studies: Secondary | ICD-10-CM

## 2013-12-01 NOTE — Assessment & Plan Note (Addendum)
Recurrent episodes of atrial flutter despite medical management and cardioversion. He is interested in ablation. We'll schedule him to be seen in [place] for further evaluation. We have recommended that he continue on his anticoagulation

## 2013-12-01 NOTE — Assessment & Plan Note (Signed)
He did not hold normal sinus rhythm and therefore will stop his amiodarone.

## 2013-12-01 NOTE — Patient Instructions (Addendum)
You are back in atrial flutter  We will schedule you an appt with Dr Rayann Heman for atrial flutter ablation:  March 9 @ 10:15  Stop the amiodarone  Please call us if you have new issues that need to be addressed before your next appt.

## 2013-12-01 NOTE — Assessment & Plan Note (Signed)
We have encouraged him to continue to work on weaning his cigarettes and smoking cessation. He will continue to work on this and does not want any assistance with chantix.  

## 2013-12-01 NOTE — Assessment & Plan Note (Signed)
Blood pressure is well controlled on today's visit. No changes made to the medications. 

## 2013-12-01 NOTE — Progress Notes (Signed)
Patient ID: Nikai Quest, male    DOB: 1946/04/14, 68 y.o.   MRN: [id number]  HPI Comments:  Mr. Broadhead is a pleasant 68 yo gentleman with long history of smoking, hypertension, hyperlipidemia who presented to [facility] on November 22 2011 with right upper extremity tingling and right facial numbness, felt to have a possible TIA, found to be in atrial fibrillation.  He was started on low-dose beta blocker, pradaxa 150 mg b.i.d., DC cardioversion early April 2013 He  stopped smoking since he left the hospital, though reports he has recently restarted smoking again. CT Scan of the head did not show any significant stroke Chest x-ray was clear apart from hyperinflation consistent with COPD Echocardiogram November 22, 2011 shows ejection fraction 50-55%, otherwise normal study  Followup EKG showed atrial flutter. He was started on amiodarone in an attempt to cardiovert pharmacologically. He remained in atrial flutter and underwent DC cardioversion early April 2013 which was successful.  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 He went home on December 17 with no pain medication. He had 48 hours of excruciating knee pain before restarting pain medication. He started physical therapy. He reports having abnormal rhythm per the PT on December 17 and was sent to the emergency room for further evaluation. EKG in the ER 10/09/2013 showed atrial flutter.  he was started on pradaxa.   He did not convert to normal sinus rhythm with medical management and underwent cardioversion last week on 12/02/2013. This was successful in restoring normal sinus rhythm. No medication changes were made.  He reports that he felt well for a very short period of time, then had recurrent fatigue, shortness of breath, general malaise. Today his back in atrial flutter  Recent lab work shows mild abnormality of his thyroid numbers, T4 done by Dr. Othelia Pulling EKG today shows atrial flutter with ventricular rate 79 beats per  minute    Outpatient Encounter Prescriptions as of 12/01/2013  Medication Sig  . amiodarone (PACERONE) 200 MG tablet Take 1 tablet (200 mg total) by mouth daily.  [name] atorvastatin (LIPITOR) 20 MG tablet Take 1 tablet (20 mg total) by mouth daily.  . dabigatran (PRADAXA) 150 MG CAPS capsule Take 150 mg by mouth 2 (two) times daily.  [name] ibuprofen (ADVIL,MOTRIN) 200 MG tablet Take 200 mg by mouth every 6 (six) hours as needed for headache or moderate pain.   [name] lisinopril (PRINIVIL,ZESTRIL) 20 MG tablet Take 1 tablet (20 mg total) by mouth daily.  . meclizine (ANTIVERT) 25 MG tablet Take 25 mg by mouth as needed for dizziness or nausea.   . meloxicam (MOBIC) 15 MG tablet Take 15 mg by mouth daily.   . methocarbamol (ROBAXIN) 500 MG tablet Take 1-2 tablets (500-1,000 mg total) by mouth every 6 (six) hours as needed for muscle spasms.  . metoprolol tartrate (LOPRESSOR) 25 MG tablet Take 1.5 tablets (37.5 mg total) by mouth 2 (two) times daily.  . Multiple Vitamin (MULTIVITAMIN) tablet Take 1 tablet by mouth daily.  [name] oxyCODONE (OXY IR/ROXICODONE) 5 MG immediate release tablet Take 1-2 tablets (5-10 mg total) by mouth every 3 (three) hours as needed for breakthrough pain.    Review of Systems  Constitutional: Positive for fatigue.  HENT: Negative.   Eyes: Negative.   Respiratory: Positive for shortness of breath.   Cardiovascular: Negative.   Gastrointestinal: Negative.   Endocrine: Negative.   Musculoskeletal: Positive for arthralgias and joint swelling.  Skin: Negative.   Allergic/Immunologic: Negative.   Neurological:  Negative.   Hematological: Negative.   Psychiatric/Behavioral: Positive for sleep disturbance.  All other systems reviewed and are negative.    BP 138/86  Pulse 79  Ht 5\' 11"  (1.803 m)  Wt 184 lb 12 oz (83.802 kg)  BMI 25.78 kg/m2  Physical Exam  Nursing note and vitals reviewed. Constitutional: He is oriented to person, place, and time. He appears well-developed and  well-nourished.  HENT:  Head: Normocephalic.  Nose: Nose normal.  Mouth/Throat: Oropharynx is clear and moist.  Eyes: Conjunctivae are normal. Pupils are equal, round, and reactive to light.  Neck: Normal range of motion. Neck supple. No JVD present.  Cardiovascular: Normal rate, S1 normal, S2 normal, normal heart sounds and intact distal pulses.  An irregularly irregular rhythm present. Exam reveals no gallop and no friction rub.   No murmur heard. Pulmonary/Chest: Effort normal and breath sounds normal. No respiratory distress. He has no wheezes. He has no rales. He exhibits no tenderness.  Abdominal: Soft. Bowel sounds are normal. He exhibits no distension. There is no tenderness.  Musculoskeletal: Normal range of motion. He exhibits no edema and no tenderness.  Lymphadenopathy:    He has no cervical adenopathy.  Neurological: He is alert and oriented to person, place, and time. Coordination normal.  Skin: Skin is warm and dry. No rash noted. No erythema.  Psychiatric: He has a normal mood and affect. His behavior is normal. Judgment and thought content normal.      Assessment and Plan

## 2013-12-03 ENCOUNTER — Encounter: Payer: Self-pay | Admitting: Internal Medicine

## 2013-12-03 ENCOUNTER — Encounter: Payer: Self-pay | Admitting: *Deleted

## 2013-12-03 ENCOUNTER — Ambulatory Visit (INDEPENDENT_AMBULATORY_CARE_PROVIDER_SITE_OTHER): Payer: Medicare Other | Admitting: Internal Medicine

## 2013-12-03 VITALS — BP 142/88 | HR 83 | Ht 71.0 in | Wt 184.2 lb

## 2013-12-03 DIAGNOSIS — I10 Essential (primary) hypertension: Secondary | ICD-10-CM

## 2013-12-03 DIAGNOSIS — I4891 Unspecified atrial fibrillation: Secondary | ICD-10-CM

## 2013-12-03 DIAGNOSIS — I4892 Unspecified atrial flutter: Secondary | ICD-10-CM

## 2013-12-03 LAB — BASIC METABOLIC PANEL
BUN: 12 mg/dL (ref 6–23)
CO2: 29 mEq/L (ref 19–32)
Calcium: 9.3 mg/dL (ref 8.4–10.5)
Chloride: 107 mEq/L (ref 96–112)
Creatinine, Ser: 0.9 mg/dL (ref 0.4–1.5)
GFR: 115.08 mL/min (ref >60.00)
Glucose, Bld: 89 mg/dL (ref 70–99)
Potassium: 3.9 mEq/L (ref 3.5–5.1)
Sodium: 142 mEq/L (ref 135–145)

## 2013-12-03 LAB — CBC WITH DIFFERENTIAL/PLATELET
Basophils Absolute: 0 10*3/uL (ref 0.0–0.1)
Basophils Relative: 0.4 % (ref 0.0–3.0)
Eosinophils Absolute: 0.2 10*3/uL (ref 0.0–0.7)
Eosinophils Relative: 2.3 % (ref 0.0–5.0)
HCT: 39.2 % (ref 39.0–52.0)
Hemoglobin: 12.5 g/dL — ABNORMAL LOW (ref 13.0–17.0)
Lymphocytes Relative: 42.4 % (ref 12.0–46.0)
Lymphs Abs: 3.3 10*3/uL (ref 0.7–4.0)
MCHC: 31.9 g/dL (ref 30.0–36.0)
MCV: 98.9 fl (ref 78.0–100.0)
Monocytes Absolute: 0.6 10*3/uL (ref 0.1–1.0)
Monocytes Relative: 7.3 % (ref 3.0–12.0)
Neutro Abs: 3.7 10*3/uL (ref 1.4–7.7)
Neutrophils Relative %: 47.6 % (ref 43.0–77.0)
Platelets: 179 10*3/uL (ref 150.0–400.0)
RBC: 3.96 Mil/uL — ABNORMAL LOW (ref 4.22–5.81)
RDW: 13.6 % (ref 11.5–14.6)
WBC: 7.8 10*3/uL (ref 4.5–10.5)
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
## 2013-12-03 NOTE — Patient Instructions (Signed)

## 2013-12-04 ENCOUNTER — Encounter (HOSPITAL_COMMUNITY): Payer: Self-pay | Admitting: Pharmacy Technician

## 2013-12-08 ENCOUNTER — Ambulatory Visit: Payer: Medicare Other | Admitting: Cardiovascular Disease

## 2013-12-11 ENCOUNTER — Ambulatory Visit (HOSPITAL_COMMUNITY): Payer: Medicare Other | Admitting: Certified Registered"

## 2013-12-11 ENCOUNTER — Encounter (HOSPITAL_COMMUNITY)
Admission: RE | Disposition: A | Discharge status: Home / Self Care | Payer: Self-pay | Source: Ambulatory Visit | Attending: Internal Medicine

## 2013-12-11 ENCOUNTER — Encounter (HOSPITAL_COMMUNITY): Payer: Medicare Other | Admitting: Certified Registered"

## 2013-12-11 ENCOUNTER — Ambulatory Visit (HOSPITAL_COMMUNITY)
Admission: RE | Admit: 2013-12-11 | Discharge: 2013-12-11 | Disposition: A | Discharge status: Home / Self Care | Payer: Medicare Other | Source: Ambulatory Visit | Attending: Internal Medicine | Admitting: Internal Medicine

## 2013-12-11 ENCOUNTER — Encounter (HOSPITAL_COMMUNITY): Payer: Self-pay | Admitting: Certified Registered"

## 2013-12-11 DIAGNOSIS — I4892 Unspecified atrial flutter: Secondary | ICD-10-CM

## 2013-12-11 DIAGNOSIS — L819 Disorder of pigmentation, unspecified: Secondary | ICD-10-CM

## 2013-12-11 DIAGNOSIS — I4891 Unspecified atrial fibrillation: Secondary | ICD-10-CM | POA: Insufficient documentation

## 2013-12-11 DIAGNOSIS — F17200 Nicotine dependence, unspecified, uncomplicated: Secondary | ICD-10-CM

## 2013-12-11 DIAGNOSIS — G459 Transient cerebral ischemic attack, unspecified: Secondary | ICD-10-CM

## 2013-12-11 DIAGNOSIS — K219 Gastro-esophageal reflux disease without esophagitis: Secondary | ICD-10-CM | POA: Insufficient documentation

## 2013-12-11 DIAGNOSIS — Z8673 Personal history of transient ischemic attack (TIA), and cerebral infarction without residual deficits: Secondary | ICD-10-CM | POA: Insufficient documentation

## 2013-12-11 DIAGNOSIS — E785 Hyperlipidemia, unspecified: Secondary | ICD-10-CM | POA: Insufficient documentation

## 2013-12-11 DIAGNOSIS — Z96659 Presence of unspecified artificial knee joint: Secondary | ICD-10-CM | POA: Insufficient documentation

## 2013-12-11 DIAGNOSIS — M1990 Unspecified osteoarthritis, unspecified site: Secondary | ICD-10-CM | POA: Insufficient documentation

## 2013-12-11 DIAGNOSIS — Z7901 Long term (current) use of anticoagulants: Secondary | ICD-10-CM | POA: Insufficient documentation

## 2013-12-11 DIAGNOSIS — I10 Essential (primary) hypertension: Secondary | ICD-10-CM | POA: Insufficient documentation

## 2013-12-11 DIAGNOSIS — R7989 Other specified abnormal findings of blood chemistry: Secondary | ICD-10-CM

## 2013-12-11 DIAGNOSIS — M1711 Unilateral primary osteoarthritis, right knee: Secondary | ICD-10-CM

## 2013-12-11 DIAGNOSIS — Z87891 Personal history of nicotine dependence: Secondary | ICD-10-CM | POA: Insufficient documentation

## 2013-12-11 HISTORY — PX: ATRIAL FLUTTER ABLATION: SHX5733

## 2013-12-11 HISTORY — PX: ABLATION: SHX5711

## 2013-12-11 SURGERY — ATRIAL FLUTTER ABLATION
Anesthesia: Monitor Anesthesia Care

## 2013-12-11 MED ORDER — SODIUM CHLORIDE 0.9 % IV SOLN: 250.0000 mL | INTRAVENOUS | Status: DC | PRN

## 2013-12-11 MED ORDER — FENTANYL CITRATE 0.05 MG/ML IJ SOLN
INTRAMUSCULAR | Status: DC | PRN
  Administered 2013-12-11 (×3): 50 ug via INTRAVENOUS

## 2013-12-11 MED ORDER — SODIUM CHLORIDE 0.9 % IJ SOLN: 3.0000 mL | INTRAMUSCULAR | Status: DC | PRN

## 2013-12-11 MED ORDER — PROPOFOL 10 MG/ML IV BOLUS
INTRAVENOUS | Status: DC | PRN
  Administered 2013-12-11 (×2): 15 mg via INTRAVENOUS

## 2013-12-11 MED ORDER — LACTATED RINGERS IV SOLN
INTRAVENOUS | Status: DC | PRN
  Administered 2013-12-11: 07:00:00 via INTRAVENOUS

## 2013-12-11 MED ORDER — SODIUM CHLORIDE 0.9 % IJ SOLN: 3.0000 mL | Freq: Two times a day (BID) | INTRAMUSCULAR | Status: DC

## 2013-12-11 MED ORDER — DABIGATRAN ETEXILATE MESYLATE 150 MG PO CAPS
150.0000 mg | ORAL_CAPSULE | Freq: Two times a day (BID) | ORAL | Status: DC
  Filled 2013-12-11: qty 1

## 2013-12-11 MED ORDER — BUPIVACAINE HCL (PF) 0.25 % IJ SOLN
INTRAMUSCULAR | Status: AC
  Filled 2013-12-11: qty 30

## 2013-12-11 MED ORDER — MIDAZOLAM HCL 5 MG/5ML IJ SOLN
INTRAMUSCULAR | Status: DC | PRN
  Administered 2013-12-11: 2 mg via INTRAVENOUS

## 2013-12-11 MED ORDER — PROPOFOL INFUSION 10 MG/ML OPTIME
INTRAVENOUS | Status: DC | PRN
  Administered 2013-12-11: 100 ug/kg/min via INTRAVENOUS

## 2013-12-11 MED ORDER — HYDROCODONE-ACETAMINOPHEN 5-325 MG PO TABS: 1.0000 | ORAL_TABLET | ORAL | Status: DC | PRN

## 2013-12-11 NOTE — Anesthesia Preprocedure Evaluation (Addendum)
Anesthesia Evaluation  Patient identified by MRN, date of birth, ID band Patient awake    Reviewed: Allergy & Precautions, H&P , NPO status , Patient's Chart, lab work & pertinent test results, reviewed documented beta blocker date and time   History of Anesthesia Complications Negative for: history of anesthetic complications  Airway Mallampati: I TM Distance: >3 FB Neck ROM: Full    Dental  (+) Teeth Intact, Dental Advisory Given   Pulmonary former smoker (quit 12/14),  breath sounds clear to auscultation        Cardiovascular hypertension, Pt. on medications and Pt. on home beta blockers - anginaRhythm:Irregular Rate:Normal  Pt reports normal stress test and ECHO in past   Neuro/Psych TIA   GI/Hepatic Neg liver ROS, GERD-  Medicated and Controlled,  Endo/Other  negative endocrine ROS  Renal/GU negative Renal ROS     Musculoskeletal   Abdominal   Peds  Hematology   Anesthesia Other Findings   Reproductive/Obstetrics                        Anesthesia Physical Anesthesia Plan  ASA: III  Anesthesia Plan: MAC   Post-op Pain Management:    Induction: Intravenous  Airway Management Planned: Nasal Cannula  Additional Equipment: None  Intra-op Plan:   Post-operative Plan:   Informed Consent: I have reviewed the patients History and Physical, chart, labs and discussed the procedure including the risks, benefits and alternatives for the proposed anesthesia with the patient or authorized representative who has indicated his/her understanding and acceptance.   Dental advisory given  Plan Discussed with: CRNA, Anesthesiologist and Surgeon  Anesthesia Plan Comments: (Plan routine monitors, MAC)       Anesthesia Quick Evaluation

## 2013-12-11 NOTE — Transfer of Care (Signed)
Immediate Anesthesia Transfer of Care Note  Patient: Carl Alvarez  Procedure(s) Performed: Procedure(s): ATRIAL FLUTTER ABLATION (N/A)  Patient Location: Cath Lab  Anesthesia Type:MAC  Level of Consciousness: awake, alert  and oriented  Airway & Oxygen Therapy: Patient Spontanous Breathing and Patient connected to nasal cannula oxygen  Post-op Assessment: Report given to PACU RN, Post -op Vital signs reviewed and stable and Patient moving all extremities  Post vital signs: Reviewed and stable  Complications: No apparent anesthesia complications

## 2013-12-11 NOTE — Discharge Summary (Signed)
ELECTROPHYSIOLOGY DISCHARGE SUMMARY   Patient ID: Carl Alvarez,  MRN: [id number], DOB/AGE: 69/01/1946 69 y.o.  Admit date: 12/11/2013 Discharge date: 12/11/2013  Primary Care Physician: Ronette Deter, MD Primary Cardiologist: Rockey Situ, MD Primary EP: Rayann Heman, MD  Primary Discharge Diagnosis:  1. Symptomatic typical appearing atrial flutter s/p EPS +RF ablation for atrial flutter  Secondary Discharge Diagnoses:  1. Paroxysmal atrial fibrillation 2. HTN 3. Dyslipidemia 4. Osteoarthritis 5. GERD  Procedures This Admission:  PROCEDURE:  1. Comprehensive EP study.  2. Coronary sinus pacing and recording.  3. Mapping of SVT.  4. Ablation of SVT.  CONCLUSIONS:  1. Isthmus-dependent right atrial flutter upon presentation, successfully ablated along the usual cavotricuspid isthmus.  2. Complete bidirectional cavotricuspid isthmus block achieved.  3. No inducible arrhythmias following ablation.  4. No early apparent complications.  History and Hospital Course:  Carl Alvarez is a 68 year old man with atrial fibrillation and atrial flutter who presented 12/03/2013 to see Dr. Rayann Heman for EP consultation. He was initially diagnosed with atrial fibrillation in Jan 2013 after presenting with TIA symptoms. He is anticoagulated with Pradaxa. He developed atrial flutter thereafter and was placed on amiodarone. He remained in atrial flutter and underwent DC cardioversion early April 2013 which was successful. He then developed palpitations Dec 2014 and was found to have recurrent atrial flutter. He was continued on anticoagulation. He underwent cardioversion earlier this month with restoration of normal sinus rhythm. Unfortunately, he has since returned to atrial flutter. He reports symptoms of fatigue, palpitations and decreased exercise tolerance with atrial flutter. Recently he had lab work showing mild abnormality of his thyroid numbers, T4 done by Dr. Othelia Pulling. His amiodarone was therefore  discontinued. Dr. Rayann Heman recommended EPS +RF ablation of atrial flutter. Carl Alvarez elected to proceed. He underwent comprehensive EP study +RF ablation of atrial flutter today. He tolerated this procedure well without any immediate complication. He remains hemodynamically stable and afebrile. His groin site is intact without significant bleeding or hematoma. He has been given discharge instructions including wound care and activity restrictions. He will follow-up in clinic in 4 weeks. He has been seen, examined and deemed stable for discharge today by Dr. [name].  Discharge Vitals: Blood pressure 132/73, pulse 72, temperature 98.6 F (37 C), temperature source Oral, resp. rate 18, height 5' 11"  (1.803 m), weight 185 lb (83.915 kg), SpO2 100.00%.   Labs: Lab Results  Component Value Date   WBC 7.8 12/03/2013   HGB 12.5* 12/03/2013   HCT 39.2 12/03/2013   MCV 98.9 12/03/2013   PLT 179.0 12/03/2013      Component Value Date/Time   NA 142 12/03/2013 1001   NA 143 11/24/2013 0837   K 3.9 12/03/2013 1001   CL 107 12/03/2013 1001   CO2 29 12/03/2013 1001   GLUCOSE 89 12/03/2013 1001   GLUCOSE 113* 11/24/2013 0837   BUN 12 12/03/2013 1001   BUN 12 11/24/2013 0837   CREATININE 0.9 12/03/2013 1001   CALCIUM 9.3 12/03/2013 1001   GFRNONAA 73 11/24/2013 0837   GFRAA 85 11/24/2013 0837    Disposition:  The patient is being discharged in stable condition.  Follow-up:     Follow-up Information   Follow up with [name], MD On 01/19/2014. (At 11:15 AM)    Specialty:  Cardiology   Contact information:   [address] [place] [address] [phone]      Discharge Medications:    Medication List  atorvastatin 20 MG tablet  Commonly known as:  LIPITOR  Take 20 mg by mouth daily.     dabigatran 150 MG Caps capsule  Commonly known as:  PRADAXA  Take 150 mg by mouth 2 (two) times daily.     metoprolol tartrate 25 MG tablet  Commonly known as:  LOPRESSOR  Take 37.5 mg  by mouth 2 (two) times daily.     multivitamin tablet  Take 1 tablet by mouth daily.     oxyCODONE 5 MG immediate release tablet  Commonly known as:  Oxy IR/ROXICODONE  Take 5 mg by mouth every 6 (six) hours as needed for severe pain.       Duration of Discharge Encounter: Greater than 30 minutes including physician time.  Manson Passey, PA-C 12/11/2013, 10:54 AM   [name] MD

## 2013-12-11 NOTE — Anesthesia Postprocedure Evaluation (Signed)
  Anesthesia Post-op Note  Patient: Carl Alvarez  Procedure(s) Performed: Procedure(s): ATRIAL FLUTTER ABLATION (N/A)  Patient Location: PACU  Anesthesia Type:MAC  Level of Consciousness: awake, alert , oriented and patient cooperative  Airway and Oxygen Therapy: Patient Spontanous Breathing and Patient connected to nasal cannula oxygen  Post-op Pain: none  Post-op Assessment: Post-op Vital signs reviewed, Patient's Cardiovascular Status Stable, Respiratory Function Stable, Patent Airway, No signs of Nausea or vomiting and Pain level controlled  Post-op Vital Signs: Reviewed and stable  Complications: No apparent anesthesia complications

## 2013-12-11 NOTE — Op Note (Signed)
PREPROCEDURE DIAGNOSIS: Typical-appearing atrial flutter.   POSTPROCEDURE DIAGNOSIS: Isthmus-dependent right atrial flutter.   PROCEDURES:  1. Comprehensive EP study.  2. Coronary sinus pacing and recording.  3. Mapping of SVT.  4. Ablation of SVT.   INTRODUCTION:  Carl Alvarez is a 68 y.o. male with a history of symptomatic typical-appearing atrial flutter. He presents today for EP study and radiofrequency ablation.   DESCRIPTION OF THE PROCEDURE: Informed written consent was obtained, and the patient was brought to the electrophysiology lab in the fasting state. The patient was then adequately sedated with anesthesia as outlined in the anesthesia report. The patient's right groin was prepped and draped in the usual sterile fashion by the EP lab staff. Using a percutaneous Seldinger technique a 6, 7, and 8-French hemostasis sheaths were placed into the right common femoral vein. A 7- [employer] decapolar coronary sinus catheter was introduced through the right common femoral vein and advanced into the coronary sinus for recording and pacing from this location. A 6-French quadripolar Josephson catheter was introduced through the right common femoral vein and advanced into the right ventricle for recording and pacing. This catheter was then pulled back to the His bundle location. The patient presented to the electrophysiology lab in atrial flutter. The surface electrocardiogram was consistent with typical atrial flutter. The coronary sinus activation sequence was proximal to distal and suggestive of right atrial flutter.  The atrial flutter cycle length was 240 msec. Atrial entrainment mapping was then performed. When pacing from the left atrium, a long post pacing interval was observed. With entrainment mapping from the cavotricuspid isthmus, the post pacing interval was equal to the tachycardia cycle length and therefore suggestive of isthmus-dependent right atrial flutter. The patient's  QRS duration measured 96 msec with an RR interval of 947 msec and an HV interval of 48 msec. I elected to perform cavotricuspid isthmus ablation.   A [employer] 7-French  32m ablation catheter was introduced through the right common femoral vein and advanced into the right atrium. Mapping of the cavotricuspid isthmus was performed which revealed a standard isthmus. A series of seven radiofrequency applications were delivered along the cavotricuspid isthmus with a target temperature of 60 degrees with power of 70 watts. During the 3rd radiofrequency ablation lesion, the tachycardia slowed and then terminated. The patient remained in sinus rhythm thereafter. An additional 4 radiofrequency applications were then delivered with similar parameters in order to obtain complete bidirectional cavotricuspid isthmus block.   Following ablation, differential atrial pacing was performed from the low lateral right atrium with a Duodecapolar halo catheter in place. This confirmed complete bidirectional cavotricuspid isthmus block with a stimulus to earliest atrial activation recorded bidirectional across the isthmus measuring 150 msec. The patient was observed for 20 minutes without return of conduction through the isthmus.  Following ablation, the AH interval measured 131 msec with an HV interval of 60 msec. Rapid atrial pacing was performed, which revealed an AV Wenckebach cycle length of 470 msec with no evidence of PR greater than RR and no tachycardias induced when pacing down to a cycle length of 230 msec. Ventricular pacing was then performed which revealed midline/ concentric and decremental VA conduction msec with a VA WCL of 560 msec.  The procedure was therefore considered completed. All catheters were removed, and the sheaths were aspirated and flushed. The sheaths were removed and hemostasis was assured. There were no early apparent complications.   CONCLUSIONS:  1. Isthmus-dependent right atrial  flutter upon presentation, successfully ablated  along the usual cavotricuspid isthmus.  2. Complete bidirectional cavotricuspid isthmus block achieved.  3. No inducible arrhythmias following ablation.  4. No early apparent complications.  Carl Rinks Jeralyn Nolden,MD 12/11/2013 9:03 AM

## 2013-12-11 NOTE — Interval H&P Note (Signed)
History and Physical Interval Note:  12/11/2013 7:07 AM  Carl Alvarez  has presented today for surgery, with the diagnosis of aflutter  The various methods of treatment have been discussed with the patient and family. After consideration of risks, benefits and other options for treatment, the patient has consented to  Procedure(s): ATRIAL FLUTTER ABLATION (N/A) as a surgical intervention .  The patient's history has been reviewed, patient examined, no change in status, stable for surgery.  I have reviewed the patient's chart and labs.  Questions were answered to the patient's satisfaction.     [name]

## 2013-12-11 NOTE — Preoperative (Signed)
Beta Blockers   Reason not to administer Beta Blockers:Metoprolol taken at 2230 hrs on 12/10/2013
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
## 2013-12-11 NOTE — Discharge Instructions (Signed)
No driving for 3 days. No lifting over 5 lbs for 1 week. No sexual activity for 1 week. Keep procedure site clean & dry. If you notice increased pain, swelling, bleeding or pus, call/return! You may shower, but no soaking baths/hot tubs/pools for 1 week. ° °

## 2013-12-11 NOTE — Progress Notes (Signed)
Patient ambulated in hallway after bedrest.  Pressure dressing removed and band aid placed.  Groin site level 0.  DC orders received.  Patient stable with no S/S of distress.  Medication and discharge information reviewed with patient and patient's wife.  Patient DC home with wife. [place], Ardeth Sportsman

## 2013-12-19 ENCOUNTER — Other Ambulatory Visit: Payer: Self-pay | Admitting: Cardiovascular Disease

## 2013-12-29 ENCOUNTER — Institutional Professional Consult (permissible substitution): Payer: Medicare Other | Admitting: Internal Medicine

## 2013-12-29 ENCOUNTER — Observation Stay (HOSPITAL_COMMUNITY)
Admission: EM | Admit: 2013-12-29 | Discharge: 2013-12-30 | Disposition: A | Discharge status: Home / Self Care | Payer: Medicare Other | Attending: Internal Medicine | Admitting: Internal Medicine

## 2013-12-29 ENCOUNTER — Emergency Department (HOSPITAL_COMMUNITY): Payer: Medicare Other

## 2013-12-29 ENCOUNTER — Encounter (HOSPITAL_COMMUNITY): Payer: Self-pay | Admitting: Emergency Medicine

## 2013-12-29 DIAGNOSIS — R609 Edema, unspecified: Secondary | ICD-10-CM | POA: Insufficient documentation

## 2013-12-29 DIAGNOSIS — Z87891 Personal history of nicotine dependence: Secondary | ICD-10-CM | POA: Insufficient documentation

## 2013-12-29 DIAGNOSIS — IMO0002 Reserved for concepts with insufficient information to code with codable children: Secondary | ICD-10-CM

## 2013-12-29 DIAGNOSIS — R0789 Other chest pain: Secondary | ICD-10-CM

## 2013-12-29 DIAGNOSIS — I10 Essential (primary) hypertension: Secondary | ICD-10-CM | POA: Diagnosis present

## 2013-12-29 DIAGNOSIS — F17200 Nicotine dependence, unspecified, uncomplicated: Secondary | ICD-10-CM | POA: Diagnosis present

## 2013-12-29 DIAGNOSIS — E785 Hyperlipidemia, unspecified: Secondary | ICD-10-CM | POA: Diagnosis present

## 2013-12-29 DIAGNOSIS — R112 Nausea with vomiting, unspecified: Secondary | ICD-10-CM | POA: Insufficient documentation

## 2013-12-29 DIAGNOSIS — Z7901 Long term (current) use of anticoagulants: Secondary | ICD-10-CM | POA: Insufficient documentation

## 2013-12-29 DIAGNOSIS — F411 Generalized anxiety disorder: Secondary | ICD-10-CM | POA: Insufficient documentation

## 2013-12-29 DIAGNOSIS — I4891 Unspecified atrial fibrillation: Principal | ICD-10-CM | POA: Diagnosis present

## 2013-12-29 DIAGNOSIS — R0602 Shortness of breath: Secondary | ICD-10-CM | POA: Insufficient documentation

## 2013-12-29 DIAGNOSIS — M2550 Pain in unspecified joint: Secondary | ICD-10-CM | POA: Insufficient documentation

## 2013-12-29 DIAGNOSIS — I4892 Unspecified atrial flutter: Secondary | ICD-10-CM | POA: Diagnosis present

## 2013-12-29 DIAGNOSIS — M1710 Unilateral primary osteoarthritis, unspecified knee: Secondary | ICD-10-CM | POA: Insufficient documentation

## 2013-12-29 DIAGNOSIS — Z79899 Other long term (current) drug therapy: Secondary | ICD-10-CM | POA: Insufficient documentation

## 2013-12-29 DIAGNOSIS — R002 Palpitations: Secondary | ICD-10-CM | POA: Insufficient documentation

## 2013-12-29 DIAGNOSIS — Z8719 Personal history of other diseases of the digestive system: Secondary | ICD-10-CM | POA: Insufficient documentation

## 2013-12-29 DIAGNOSIS — Z880 Allergy status to penicillin: Secondary | ICD-10-CM | POA: Insufficient documentation

## 2013-12-29 DIAGNOSIS — Z9889 Other specified postprocedural states: Secondary | ICD-10-CM | POA: Insufficient documentation

## 2013-12-29 LAB — BASIC METABOLIC PANEL
BUN: 16 mg/dL (ref 6–23)
CO2: 24 mEq/L (ref 19–32)
Calcium: 9.4 mg/dL (ref 8.4–10.5)
Chloride: 106 mEq/L (ref 96–112)
Creatinine, Ser: 0.89 mg/dL (ref 0.50–1.35)
GFR calc Af Amer: >90 mL/min (ref >90)
GFR calc non Af Amer: 86 mL/min — ABNORMAL LOW (ref >90)
Glucose, Bld: 133 mg/dL — ABNORMAL HIGH (ref 70–99)
Potassium: 3.4 mEq/L — ABNORMAL LOW (ref 3.7–5.3)
Sodium: 146 mEq/L (ref 137–147)

## 2013-12-29 LAB — PRO B NATRIURETIC PEPTIDE: Pro B Natriuretic peptide (BNP): 1425 pg/mL — ABNORMAL HIGH (ref 0–125)

## 2013-12-29 LAB — CBC
HCT: 43.5 % (ref 39.0–52.0)
Hemoglobin: 15.1 g/dL (ref 13.0–17.0)
MCH: 31.9 pg (ref 26.0–34.0)
MCHC: 34.7 g/dL (ref 30.0–36.0)
MCV: 91.8 fL (ref 78.0–100.0)
Platelets: 193 K/uL (ref 150–400)
RBC: 4.74 MIL/uL (ref 4.22–5.81)
RDW: 12.8 % (ref 11.5–15.5)
WBC: 10 K/uL (ref 4.0–10.5)

## 2013-12-29 LAB — I-STAT TROPONIN, ED: Troponin i, poc: 0 ng/mL (ref 0.00–0.08)

## 2013-12-29 MED ORDER — ASPIRIN 81 MG PO CHEW
162.0000 mg | CHEWABLE_TABLET | Freq: Once | ORAL | Status: AC
  Administered 2013-12-30: 162 mg via ORAL
  Filled 2013-12-29: qty 2

## 2013-12-29 MED ORDER — DILTIAZEM HCL 100 MG IV SOLR
5.0000 mg/h | Freq: Once | INTRAVENOUS | Status: AC
  Administered 2013-12-29: 5 mg/h via INTRAVENOUS

## 2013-12-29 NOTE — ED Notes (Signed)
Gjim, MD at bedside.

## 2013-12-29 NOTE — ED Notes (Signed)
Patient with chest pain with shortness of breath and tachycardia.  Patient states he feels like there is a bubble around his heart.  Patient is in AFib rate of 155.
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
## 2013-12-30 ENCOUNTER — Observation Stay (HOSPITAL_COMMUNITY): Payer: Medicare Other | Admitting: Anesthesiology

## 2013-12-30 ENCOUNTER — Encounter (HOSPITAL_COMMUNITY): Payer: Medicare Other | Admitting: Anesthesiology

## 2013-12-30 ENCOUNTER — Encounter (HOSPITAL_COMMUNITY): Payer: Self-pay | Admitting: *Deleted

## 2013-12-30 DIAGNOSIS — I4891 Unspecified atrial fibrillation: Secondary | ICD-10-CM | POA: Diagnosis present

## 2013-12-30 LAB — PROTIME-INR
INR: 1.24 (ref 0.00–1.49)
Prothrombin Time: 15.3 seconds — ABNORMAL HIGH (ref 11.6–15.2)

## 2013-12-30 LAB — COMPREHENSIVE METABOLIC PANEL
ALT: 13 U/L (ref 0–53)
AST: 13 U/L (ref 0–37)
Albumin: 3 g/dL — ABNORMAL LOW (ref 3.5–5.2)
Alkaline Phosphatase: 71 U/L (ref 39–117)
BUN: 11 mg/dL (ref 6–23)
CO2: 25 mEq/L (ref 19–32)
Calcium: 8.5 mg/dL (ref 8.4–10.5)
Chloride: 104 mEq/L (ref 96–112)
Creatinine, Ser: 0.81 mg/dL (ref 0.50–1.35)
GFR calc Af Amer: >90 mL/min (ref >90)
GFR calc non Af Amer: 89 mL/min — ABNORMAL LOW (ref >90)
Glucose, Bld: 107 mg/dL — ABNORMAL HIGH (ref 70–99)
Potassium: 3.2 mEq/L — ABNORMAL LOW (ref 3.7–5.3)
Sodium: 143 mEq/L (ref 137–147)
Total Bilirubin: 0.4 mg/dL (ref 0.3–1.2)
Total Protein: 6 g/dL (ref 6.0–8.3)

## 2013-12-30 LAB — TROPONIN I
Troponin I: <0.3 ng/mL
Troponin I: <0.3 ng/mL
Troponin I: <0.3 ng/mL

## 2013-12-30 LAB — BASIC METABOLIC PANEL
BUN: 11 mg/dL (ref 6–23)
CO2: 25 mEq/L (ref 19–32)
Calcium: 8.5 mg/dL (ref 8.4–10.5)
Chloride: 105 mEq/L (ref 96–112)
Creatinine, Ser: 0.83 mg/dL (ref 0.50–1.35)
GFR calc Af Amer: >90 mL/min (ref >90)
GFR calc non Af Amer: 88 mL/min — ABNORMAL LOW (ref >90)
Glucose, Bld: 105 mg/dL — ABNORMAL HIGH (ref 70–99)
Potassium: 3.2 mEq/L — ABNORMAL LOW (ref 3.7–5.3)
Sodium: 143 mEq/L (ref 137–147)

## 2013-12-30 LAB — TSH: TSH: 0.008 u[IU]/mL — ABNORMAL LOW (ref 0.350–4.500)

## 2013-12-30 LAB — CBC
HCT: 37.2 % — ABNORMAL LOW (ref 39.0–52.0)
Hemoglobin: 12.7 g/dL — ABNORMAL LOW (ref 13.0–17.0)
MCH: 31.4 pg (ref 26.0–34.0)
MCHC: 34.1 g/dL (ref 30.0–36.0)
MCV: 92.1 fL (ref 78.0–100.0)
Platelets: 169 10*3/uL (ref 150–400)
RBC: 4.04 MIL/uL — ABNORMAL LOW (ref 4.22–5.81)
RDW: 13 % (ref 11.5–15.5)
WBC: 7.4 10*3/uL (ref 4.0–10.5)

## 2013-12-30 LAB — HEMOGLOBIN A1C
Hgb A1c MFr Bld: 5.8 % — ABNORMAL HIGH (ref <5.7)
Mean Plasma Glucose: 120 mg/dL — ABNORMAL HIGH (ref <117)

## 2013-12-30 LAB — APTT: aPTT: 33 seconds (ref 24–37)

## 2013-12-30 MED ORDER — LIDOCAINE HCL (CARDIAC) 20 MG/ML IV SOLN
INTRAVENOUS | Status: DC | PRN
  Administered 2013-12-30: 100 mg via INTRAVENOUS

## 2013-12-30 MED ORDER — ASPIRIN EC 81 MG PO TBEC
81.0000 mg | DELAYED_RELEASE_TABLET | Freq: Every day | ORAL | Status: DC
  Administered 2013-12-30: 81 mg via ORAL
  Filled 2013-12-30: qty 1

## 2013-12-30 MED ORDER — METOPROLOL TARTRATE 50 MG PO TABS
50.0000 mg | ORAL_TABLET | Freq: Two times a day (BID) | ORAL | Status: DC
  Filled 2013-12-30: qty 1

## 2013-12-30 MED ORDER — DABIGATRAN ETEXILATE MESYLATE 150 MG PO CAPS
150.0000 mg | ORAL_CAPSULE | Freq: Two times a day (BID) | ORAL | Status: DC
  Administered 2013-12-30: 150 mg via ORAL
  Filled 2013-12-30 (×2): qty 1

## 2013-12-30 MED ORDER — ASPIRIN 81 MG PO TBEC: 81.0000 mg | DELAYED_RELEASE_TABLET | Freq: Every day | ORAL | Status: DC

## 2013-12-30 MED ORDER — METOPROLOL TARTRATE 50 MG PO TABS: 50.0000 mg | ORAL_TABLET | Freq: Two times a day (BID) | ORAL | Status: DC

## 2013-12-30 MED ORDER — SODIUM CHLORIDE 0.9 % IJ SOLN: 3.0000 mL | INTRAMUSCULAR | Status: DC | PRN

## 2013-12-30 MED ORDER — DEXTROSE 5 % IV SOLN
5.0000 mg/h | INTRAVENOUS | Status: DC
  Administered 2013-12-30 (×2): 15 mg/h via INTRAVENOUS
  Filled 2013-12-30 (×2): qty 100

## 2013-12-30 MED ORDER — PROPOFOL 10 MG/ML IV BOLUS
INTRAVENOUS | Status: DC | PRN
  Administered 2013-12-30: 100 mg via INTRAVENOUS

## 2013-12-30 MED ORDER — RIVAROXABAN 20 MG PO TABS: 20.0000 mg | ORAL_TABLET | Freq: Every day | ORAL | Status: DC

## 2013-12-30 MED ORDER — ADULT MULTIVITAMIN W/MINERALS CH
1.0000 | ORAL_TABLET | Freq: Every day | ORAL | Status: DC
  Administered 2013-12-30: 1 via ORAL
  Filled 2013-12-30: qty 1

## 2013-12-30 MED ORDER — METOPROLOL TARTRATE 25 MG PO TABS
37.5000 mg | ORAL_TABLET | Freq: Two times a day (BID) | ORAL | Status: DC
  Administered 2013-12-30: 37.5 mg via ORAL
  Filled 2013-12-30 (×2): qty 1

## 2013-12-30 MED ORDER — ONDANSETRON HCL 4 MG/2ML IJ SOLN
4.0000 mg | Freq: Once | INTRAMUSCULAR | Status: AC
  Administered 2013-12-30: 4 mg via INTRAVENOUS
  Filled 2013-12-30: qty 2

## 2013-12-30 MED ORDER — ATORVASTATIN CALCIUM 20 MG PO TABS
20.0000 mg | ORAL_TABLET | Freq: Every day | ORAL | Status: DC
Start: 2013-12-30 — End: 2013-12-30
  Filled 2013-12-30: qty 1

## 2013-12-30 MED ORDER — ONE-DAILY MULTI VITAMINS PO TABS: 1.0000 | ORAL_TABLET | Freq: Every day | ORAL | Status: DC

## 2013-12-30 MED ORDER — DRONEDARONE HCL 400 MG PO TABS: 400.0000 mg | ORAL_TABLET | Freq: Two times a day (BID) | ORAL | Status: DC

## 2013-12-30 MED ORDER — SODIUM CHLORIDE 0.9 % IV SOLN
250.0000 mL | INTRAVENOUS | Status: DC
  Administered 2013-12-30: 12:00:00 via INTRAVENOUS

## 2013-12-30 MED ORDER — SODIUM CHLORIDE 0.9 % IJ SOLN: 3.0000 mL | Freq: Two times a day (BID) | INTRAMUSCULAR | Status: DC

## 2013-12-30 NOTE — Discharge Summary (Signed)
Physician Discharge Summary       Patient ID: Carl Alvarez MRN: [id number] DOB/AGE: 26-Apr-1946 68 y.o.  Admit date: 12/29/2013 Discharge date: 12/30/2013  Discharge Diagnoses:  Principal Problem:   Atrial fibrillation with RVR Active Problems:   Atrial fibrillation   Hyperlipidemia LDL goal < 100   Hypertension   Smoker   Atrial flutter   Discharged Condition: good  Procedures: 12/30/13 successful  DCCV with early recurrence of a fib by Dr. Rayann Heman.     Hospital Course: 68 yo man with PMH of hypertension, GERD, TIA and atrial fibrillation s/p DCCV 4/13 followed by Dr. Rockey Situ who had more symptoms and atrial fibrillation in early February leading to another DCCV and referral to Dr. Rayann Heman, where ablation was considered given significant symptoms and had atrial flutter ablation 12/06/13. He presents today with some anxiety and intermittent palpitations for approximately 24 hours now with some associated chest pressure and fullness leading to presentation. He also had some shortness of breath related to these symptoms. No passing out or nausea/vomiting/diarrhea. No fever/chills. He continues on pradaxa, no compliance issues. Of note he had a knee replacement 3 months ago.   He was found to be in a fib with RVR.  He is still in the post ablation window. He was made NPO to prepare for cardioversion.  He was placed on IV dilt to control the rate.  He is anticoagulated on Pradaxa.  He was counseled on tobacco cessation.  Troponin was negative X 3.  Pro BNP elevated.  TSH was low at 0.008 to follow with PCP.  Later on AM of admit he was successfully cardioverted to SR by Dr. Rayann Heman with early recurrence of A. Fib.  He was found stable for discharge continuing metoprolol 50 mg BID, begin Multaq 400 mg BID, stop Pradaxa and begin Xarelto (due to Multaq and pradaxa interaction)  He will follow up with Dr. Rayann Heman as previously arranged.   Consults: cardiology  Significant Diagnostic Studies:    BMET    Component Value Date/Time   NA 143 12/30/2013 0445   NA 143 12/30/2013 0445   NA 143 11/24/2013 0837   K 3.2* 12/30/2013 0445   K 3.2* 12/30/2013 0445   CL 104 12/30/2013 0445   CL 105 12/30/2013 0445   CO2 25 12/30/2013 0445   CO2 25 12/30/2013 0445   GLUCOSE 107* 12/30/2013 0445   GLUCOSE 105* 12/30/2013 0445   GLUCOSE 113* 11/24/2013 0837   BUN 11 12/30/2013 0445   BUN 11 12/30/2013 0445   BUN 12 11/24/2013 0837   CREATININE 0.81 12/30/2013 0445   CREATININE 0.83 12/30/2013 0445   CALCIUM 8.5 12/30/2013 0445   CALCIUM 8.5 12/30/2013 0445   GFRNONAA 89* 12/30/2013 0445   GFRNONAA 88* 12/30/2013 0445   GFRAA >90 12/30/2013 0445   GFRAA >90 12/30/2013 0445    CBC    Component Value Date/Time   WBC 7.4 12/30/2013 0445   WBC 6.7 11/24/2013 0837   RBC 4.04* 12/30/2013 0445   RBC 3.92* 11/24/2013 0837   HGB 12.7* 12/30/2013 0445   HCT 37.2* 12/30/2013 0445   PLT 169 12/30/2013 0445   MCV 92.1 12/30/2013 0445   MCH 31.4 12/30/2013 0445   MCH 32.1 11/24/2013 0837   MCHC 34.1 12/30/2013 0445   MCHC 32.7 11/24/2013 0837   RDW 13.0 12/30/2013 0445   RDW 13.6 11/24/2013 0837   LYMPHSABS 3.3 12/03/2013 1001   LYMPHSABS 2.5 11/24/2013 0837   MONOABS 0.6 12/03/2013 1001  EOSABS 0.2 12/03/2013 1001   EOSABS 0.2 11/24/2013 0837   BASOSABS 0.0 12/03/2013 1001   BASOSABS 0.0 11/24/2013 0837     Troponin Neg X 3 TSH 0.008;  hgbA1C 5.8   2 D echo: Left ventricle: The cavity size was normal. Wall thickness was normal. Systolic function was vigorous. The estimated ejection fraction was in the range of 65% to 70%. Wall motion was normal; there were no regional wall motion abnormalities. - Right atrium: The atrium was mildly dilated  CXR: FINDINGS: Aortic atherosclerosis. Cardiomediastinal contours within normal range, similar to prior. Lungs are well aerated to mildly hyperinflated. No focal consolidation. No pleural effusion or pneumothorax. Degenerative changes of the acromioclavicular joints. No acute  osseous finding. IMPRESSION: No radiographic evidence of an acute cardiopulmonary process.    Discharge Exam: Blood pressure 115/63, pulse 83, temperature 98.8 F (37.1 C), temperature source Oral, resp. rate 18, height 5' 11"  (1.803 m), weight 178 lb 3.2 oz (80.831 kg), SpO2 97.00%.    Disposition: 01-Home or Self Care       Future Appointments Provider Department Dept Phone   01/19/2014 11:15 AM [name], MD [facility] Office [phone]       Medication List    STOP taking these medications       dabigatran 150 MG Caps capsule  Commonly known as:  PRADAXA     ibuprofen 200 MG tablet  Commonly known as:  ADVIL,MOTRIN      TAKE these medications       aspirin 81 MG EC tablet  Take 1 tablet (81 mg total) by mouth daily.     atorvastatin 20 MG tablet  Commonly known as:  LIPITOR  Take 20 mg by mouth daily at 6 PM.     dronedarone 400 MG tablet  Commonly known as:  MULTAQ  Take 1 tablet (400 mg total) by mouth 2 (two) times daily with a meal.     metoprolol 50 MG tablet  Commonly known as:  LOPRESSOR  Take 1 tablet (50 mg total) by mouth 2 (two) times daily.     multivitamin tablet  Take 1 tablet by mouth daily.     Rivaroxaban 20 MG Tabs tablet  Commonly known as:  XARELTO  Take 1 tablet (20 mg total) by mouth daily with supper.       Follow-up Information   Follow up with [name], MD. (follow up as previously arranged)    Specialty:  Cardiology   Contact information:   [place] [address] [phone]        Discharge Instructions:Follow up with primary MD for thyroid management frequently  Heart Healthy diet    Signed: [facility]: [facility]ART[facility]ARE 12/30/2013, 11:43 PM  Time spent on discharge : >30 minutes.     [name] MD
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
## 2013-12-30 NOTE — Anesthesia Preprocedure Evaluation (Signed)
Anesthesia Evaluation  Patient identified by MRN, date of birth, ID band Patient awake    Reviewed: Allergy & Precautions, H&P , Patient's Chart, lab work & pertinent test results, reviewed documented beta blocker date and time   History of Anesthesia Complications Negative for: history of anesthetic complications  Airway Mallampati: II TM Distance: >3 FB Neck ROM: full    Dental   Pulmonary former smoker,  breath sounds clear to auscultation        Cardiovascular Exercise Tolerance: Good hypertension, + dysrhythmias Atrial Fibrillation Rhythm:regular Rate:Normal     Neuro/Psych    GI/Hepatic GERD-  Controlled,  Endo/Other    Renal/GU      Musculoskeletal   Abdominal   Peds  Hematology   Anesthesia Other Findings   Reproductive/Obstetrics                           Anesthesia Physical Anesthesia Plan  ASA: III  Anesthesia Plan: General   Post-op Pain Management:    Induction:   Airway Management Planned: Mask  Additional Equipment:   Intra-op Plan:   Post-operative Plan:   Informed Consent: I have reviewed the patients History and Physical, chart, labs and discussed the procedure including the risks, benefits and alternatives for the proposed anesthesia with the patient or authorized representative who has indicated his/her understanding and acceptance.   Dental Advisory Given  Plan Discussed with: CRNA and Surgeon  Anesthesia Plan Comments:         Anesthesia Quick Evaluation

## 2013-12-30 NOTE — Progress Notes (Signed)
  Echocardiogram 2D Echocardiogram has been performed.  Mauricio Po 12/30/2013, 3:27 PM

## 2013-12-30 NOTE — CV Procedure (Signed)
EP procedure Note   Pre procedure Diagnosis:  Persistent Atrial fibrillation Post procedure Diagnosis:  Same  Procedures:  Electrical cardioversion  Description:  Informed, written consent was obtained for cardioversion.  Adequate IV acces and airway support were assured.  The patient was adequately sedated with intravenous propofol as outlined in the anesthesia report.  The patient presented today in atrial fibrillation.  He was successfully cardioverted to sinus rhythm with a single synchronized biphasic 200J shock delivered with cardioversion electrodes placed in the anterior/posterior configuration.  Unfortunately he quickly returned to have (within several seconds).  He was again successfully cardioverted to sinus a second time with 200J biphasic but returned to afib within 3 minutes. He was successfully cardioverted to sinus a third time with 200J biphasic but returned to afib within 1 minute.   He remains in afib thereafter.  There were no early apparent complications.  Conclusions:  1.  Successful cardioversion of afib to sinus rhythm but with early recurrence of afib x 3.  He remains in afib at this time.  Antiarrhythmic options will need to be considered. 2.  No early apparent complications.   Viann Nielson,MD 4:17 PM 12/30/2013

## 2013-12-30 NOTE — Anesthesia Postprocedure Evaluation (Signed)
  Anesthesia Post-op Note  Patient: Carl Alvarez  Procedure(s) Performed: * No procedures listed *  Patient Location: PACU and Nursing Unit  Anesthesia Type:MAC  Level of Consciousness: awake, alert  and oriented  Airway and Oxygen Therapy: Patient Spontanous Breathing and Patient connected to nasal cannula oxygen  Post-op Pain: none  Post-op Assessment: Post-op Vital signs reviewed, Patient's Cardiovascular Status Stable, Respiratory Function Stable, Patent Airway and Pain level controlled  Post-op Vital Signs: Reviewed and stable  Complications: No apparent anesthesia complications
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
## 2013-12-30 NOTE — Transfer of Care (Signed)
Immediate Anesthesia Transfer of Care Note  Patient: Carl Alvarez  Procedure(s) Performed: * No procedures listed *  Patient Location: PACU and Nursing Unit  Anesthesia Type:MAC  Level of Consciousness: awake, alert  and oriented  Airway & Oxygen Therapy: Patient Spontanous Breathing and Patient connected to nasal cannula oxygen  Post-op Assessment: Report given to PACU RN and Post -op Vital signs reviewed and stable  Post vital signs: Reviewed and stable  Complications: No apparent anesthesia complications

## 2013-12-30 NOTE — Progress Notes (Signed)
Unsuccessful cardioversion in the setting of hyperthyroidism.  Plan: Continue metoprolol 64m BID Start multaq 4080mBID  Stop pradaxa (due to interaction with multaq) Start xarelto 2024maily Follow closely with primary care for thyroid management  Discharge to home today Return to follow-up with me as scheduled

## 2013-12-30 NOTE — Discharge Instructions (Signed)
Follow up with primary MD for thyroid management frequently  Heart Healthy diet  Atrial Fibrillation Atrial fibrillation is a condition that causes your heart to beat irregularly. It may also cause your heart to beat faster than normal. Atrial fibrillation can prevent your heart from pumping blood normally. It increases your risk of stroke and heart problems. HOME CARE  Take medications as told by your doctor.  Only take medications that your doctor says are safe. Some medications can make the condition worse or happen again.  If blood thinners were prescribed by your doctor, take them exactly as told. Too much can cause bleeding. Too little and you will not have the needed protection against stroke and other problems.  Perform blood tests at home if told by your doctor.  Perform blood tests exactly as told by your doctor.  Do not drink alcohol.  Do not drink beverages with caffeine such as coffee, soda, and some teas.  Maintain a healthy weight.  Do not use diet pills unless your doctor says they are safe. They may make heart problems worse.  Follow diet instructions as told by your doctor.  Exercise regularly as told by your doctor.  Keep all follow-up appointments. GET HELP RIGHT AWAY IF:   You have chest or belly (abdominal) pain.  You feel sick to your stomach (nauseous)  You suddenly have swollen feet and ankles.  You feel dizzy.  You face, arms, or legs feel numb or weak.  There is a change in your vision or speech.  You notice a change in the speed, rhythm, or strength of your heartbeat.  You suddenly begin peeing (urinating) more often.  You get tired more easily when moving or exercising. MAKE SURE YOU:   Understand these instructions.  Will watch your condition.  Will get help right away if you are not doing well or get worse. Document Released: 07/18/2008 Document Revised: 02/03/2013 Document Reviewed: 11/19/2012 [facility] Patient Information  2014 [place].

## 2013-12-30 NOTE — ED Notes (Signed)
Cardiology at bedside.

## 2013-12-30 NOTE — Preoperative (Signed)
Beta Blockers   Reason not to administer Beta Blockers:Not Applicable 

## 2014-01-01 LAB — AMIODARONE LEVEL
Amiodarone Lvl: 0.4 ug/mL — ABNORMAL LOW (ref 1.5–2.5)
N-Desethyl-Amiodarone: 0.4 ug/mL — ABNORMAL LOW (ref 1.5–2.5)

## 2014-01-19 ENCOUNTER — Ambulatory Visit (INDEPENDENT_AMBULATORY_CARE_PROVIDER_SITE_OTHER): Payer: Medicare Other | Admitting: Internal Medicine

## 2014-01-19 ENCOUNTER — Encounter: Payer: Self-pay | Admitting: Internal Medicine

## 2014-01-19 VITALS — BP 146/72 | HR 64 | Ht 71.0 in | Wt 187.0 lb

## 2014-01-19 DIAGNOSIS — I4891 Unspecified atrial fibrillation: Secondary | ICD-10-CM

## 2014-01-19 DIAGNOSIS — I10 Essential (primary) hypertension: Secondary | ICD-10-CM
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
## 2014-01-19 NOTE — Patient Instructions (Signed)
Your physician recommends that you schedule a follow-up appointment in 3 months with Dr Allred    

## 2014-01-26 ENCOUNTER — Telehealth: Payer: Self-pay | Admitting: Internal Medicine

## 2014-01-26 MED ORDER — APIXABAN 5 MG PO TABS: 5.0000 mg | ORAL_TABLET | Freq: Two times a day (BID) | ORAL | Status: DC

## 2014-01-26 NOTE — Telephone Encounter (Signed)
Reviewed with Dr Hoyle Barr recommended pt change from Xarelto to Eliquis 5mg  bid. After reviewing medications I confirmed that pt is taking aspirin 81mg  daily. I reviewed with Dr Caryl Comes again. He recommended pt stop aspirin before changing to Eliquis from Xarelto to see if bleeding would resolve. Pt agreed with this plan. He will stop aspirin and call back in a few days if his bleeding does not improve.

## 2014-01-26 NOTE — Telephone Encounter (Signed)
Pt states that since he changed blood thinners from Pradaxa to Xarelto he has feels thickness in his throat when he gets up at night to go to the bathroom.  When he spits this up it is like a thick, red mucous. Pt denies a cough.  This has happened every night since changing to Xarelto. I will review with Dr Caryl Comes.

## 2014-01-26 NOTE — Telephone Encounter (Signed)
New message     Pt is on blood thinner.  Gums are bleeding at night.  Need advise.

## 2014-01-27 ENCOUNTER — Telehealth: Payer: Self-pay | Admitting: *Deleted

## 2014-01-27 NOTE — Telephone Encounter (Signed)
PA to [facility] for patients eliquis

## 2014-01-28 ENCOUNTER — Telehealth: Payer: Self-pay | Admitting: Internal Medicine

## 2014-01-28 NOTE — Telephone Encounter (Signed)
New Message:  Pt states he is taking a blood thinner but his gums are bleeding.Carl Alvarez He states it doesn't happen during the day, only at night when he sleeps.Carl KitchenMarland KitchenMarland Alvarez

## 2014-01-29 NOTE — Telephone Encounter (Signed)
[facility] approves eliquis through 01/28/2015, [place] # [id number]

## 2014-01-29 NOTE — Telephone Encounter (Signed)
Spoke with patient and he says since discontinuing the aspirin the bleeding of his gums is much improved.  He is going to keep a watch on it for the next week and call us back if it does not stop completely

## 2014-02-03 ENCOUNTER — Ambulatory Visit: Payer: Self-pay | Admitting: Internal Medicine

## 2014-02-24 ENCOUNTER — Other Ambulatory Visit: Payer: Self-pay

## 2014-02-24 ENCOUNTER — Telehealth: Payer: Self-pay | Admitting: Internal Medicine

## 2014-02-24 MED ORDER — RIVAROXABAN 20 MG PO TABS: 20.0000 mg | ORAL_TABLET | Freq: Every day | ORAL | Status: DC

## 2014-02-24 MED ORDER — METOPROLOL TARTRATE 50 MG PO TABS: 50.0000 mg | ORAL_TABLET | Freq: Two times a day (BID) | ORAL | Status: DC

## 2014-02-24 MED ORDER — ATORVASTATIN CALCIUM 20 MG PO TABS: 20.0000 mg | ORAL_TABLET | Freq: Every day | ORAL | Status: DC

## 2014-02-24 NOTE — Telephone Encounter (Signed)
New message    Talk to a nurse regarding his medication mix-up.

## 2014-02-24 NOTE — Telephone Encounter (Signed)
New Prob   Pt is requesting new prescriptions for Lopressor, Lipitor, and Xarelto sent to a new pharmacy. Please call.

## 2014-02-27 ENCOUNTER — Other Ambulatory Visit: Payer: Self-pay

## 2014-02-27 MED ORDER — METOPROLOL TARTRATE 50 MG PO TABS: 50.0000 mg | ORAL_TABLET | Freq: Two times a day (BID) | ORAL | Status: DC

## 2014-02-27 MED ORDER — RIVAROXABAN 20 MG PO TABS: 20.0000 mg | ORAL_TABLET | Freq: Every day | ORAL | Status: DC

## 2014-02-27 MED ORDER — ATORVASTATIN CALCIUM 20 MG PO TABS: 20.0000 mg | ORAL_TABLET | Freq: Every day | ORAL | Status: DC

## 2014-05-18 ENCOUNTER — Other Ambulatory Visit: Payer: Self-pay | Admitting: Internal Medicine

## 2014-05-19 NOTE — Telephone Encounter (Signed)
Sherri, just wanted to confirm with you that the patient is to be taking eliquis instead of xarelto. Please advise. Thanks, MI

## 2014-05-21 NOTE — Telephone Encounter (Signed)
This is not a Caryl Comes patient. Need to follow up with Allred/Kelly.  (But last I see they should be on eliquis. Changed to that in April)

## 2014-05-22 ENCOUNTER — Other Ambulatory Visit: Payer: Self-pay

## 2014-05-22 MED ORDER — APIXABAN 5 MG PO TABS: 5.0000 mg | ORAL_TABLET | Freq: Two times a day (BID) | ORAL | Status: DC

## 2014-07-22 ENCOUNTER — Ambulatory Visit (INDEPENDENT_AMBULATORY_CARE_PROVIDER_SITE_OTHER): Payer: Medicare Other | Admitting: Cardiovascular Disease

## 2014-07-22 ENCOUNTER — Encounter: Payer: Self-pay | Admitting: Cardiovascular Disease

## 2014-07-22 VITALS — BP 110/78 | HR 72 | Ht 69.0 in | Wt 194.2 lb

## 2014-07-22 DIAGNOSIS — I480 Paroxysmal atrial fibrillation: Secondary | ICD-10-CM

## 2014-07-22 DIAGNOSIS — I4892 Unspecified atrial flutter: Secondary | ICD-10-CM

## 2014-07-22 DIAGNOSIS — F17200 Nicotine dependence, unspecified, uncomplicated: Secondary | ICD-10-CM

## 2014-07-22 DIAGNOSIS — I4891 Unspecified atrial fibrillation: Secondary | ICD-10-CM

## 2014-07-22 DIAGNOSIS — E785 Hyperlipidemia, unspecified: Secondary | ICD-10-CM

## 2014-07-22 DIAGNOSIS — I10 Essential (primary) hypertension: Secondary | ICD-10-CM

## 2014-07-22 NOTE — Assessment & Plan Note (Signed)
Blood pressure is well controlled on today's visit. No changes made to the medications. 

## 2014-07-22 NOTE — Assessment & Plan Note (Addendum)
paroxysmal atrial fibrillation. Uncertain if this is exacerbated by recent sinus and chest congestion. EKG in the office today showing atrial fibrillation, converting to normal sinus rhythm during his visit. Recommend he stay on anticoagulation and multaq for now. We'll discuss with Dr. Rayann Heman whether we should change him back to amiodarone or possibly flecainide.

## 2014-07-22 NOTE — Progress Notes (Signed)
Patient ID: Carl Alvarez, male    DOB: 04/15/1946, 68 y.o.   MRN: [id number]  HPI Comments:  Mr. Hallas is a pleasant 68 yo gentleman with long history of smoking, hypertension, hyperlipidemia who presented to [facility] on November 22 2011 with right upper extremity tingling and right facial numbness, felt to have a possible TIA, found to be in atrial fibrillation.  He was started on low-dose beta blocker, pradaxa 150 mg b.i.d., DC cardioversion early April 2013 He  stopped smoking since he left the hospital, though reports he has recently restarted smoking again. CT Scan of the head did not show any significant stroke Chest x-ray was clear apart from hyperinflation consistent with COPD Echocardiogram November 22, 2011 shows ejection fraction 50-55%, otherwise normal study  Followup EKG showed atrial flutter. He was started on amiodarone in an attempt to cardiovert pharmacologically. He remained in atrial flutter and underwent DC cardioversion early April 2013 which was successful.  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 He went home on December 17 with no pain medication. He had 48 hours of excruciating knee pain before restarting pain medication. He started physical therapy. He reports having abnormal rhythm per the PT on December 17 and was sent to the emergency room for further evaluation. EKG in the ER 10/09/2013 showed atrial flutter.  he was started on pradaxa.   He did not convert to normal sinus rhythm with medical management and underwent cardioversion last week on 12/02/2013. This was successful in restoring normal sinus rhythm.   In followup today, he has been seen by Dr. Rayann Heman and underwent a flutter ablation in the past. This was successful. He does report having some chest congestion, nasal congestion on today's visit, starting yesterday. Otherwise he feels well. "The best he has ever felt"  Initial EKG showed atrial fibrillation with rate 72 beats per minute. He did  not appreciate the arrhythmia On clinical exam, rate was regular and repeat EKG showed normal sinus rhythm with rate 65 beats per minute He reports that he did take his medications this morning     Outpatient Encounter Prescriptions as of 07/22/2014  Medication Sig  . apixaban (ELIQUIS) 5 MG TABS tablet Take 1 tablet (5 mg total) by mouth 2 (two) times daily.  [name] atorvastatin (LIPITOR) 20 MG tablet Take 1 tablet (20 mg total) by mouth daily at 6 PM.  . dronedarone (MULTAQ) 400 MG tablet Take 1 tablet (400 mg total) by mouth 2 (two) times daily with a meal.  . metoprolol (LOPRESSOR) 50 MG tablet Take 1 tablet (50 mg total) by mouth 2 (two) times daily.  . Multiple Vitamin (MULTIVITAMIN) tablet Take 1 tablet by mouth daily.  . [DISCONTINUED] rivaroxaban (XARELTO) 20 MG TABS tablet Take 1 tablet (20 mg total) by mouth daily with supper.    Review of Systems  Constitutional: Negative.   HENT: Positive for congestion.   Eyes: Negative.   Respiratory: Negative.   Cardiovascular: Negative.   Gastrointestinal: Negative.   Endocrine: Negative.   Musculoskeletal: Positive for arthralgias and joint swelling.  Skin: Negative.   Allergic/Immunologic: Negative.   Neurological: Negative.   Hematological: Negative.   Psychiatric/Behavioral: Positive for sleep disturbance.  All other systems reviewed and are negative.   BP 110/78  Pulse 72  Ht 5\' 9"  (1.753 m)  Wt 194 lb 4 oz (88.111 kg)  BMI 28.67 kg/m2  Physical Exam  Nursing note and vitals reviewed. Constitutional: He is oriented to person, place, and  time. He appears well-developed and well-nourished.  HENT:  Head: Normocephalic.  Nose: Nose normal.  Mouth/Throat: Oropharynx is clear and moist.  Eyes: Conjunctivae are normal. Pupils are equal, round, and reactive to light.  Neck: Normal range of motion. Neck supple. No JVD present.  Cardiovascular: Normal rate, S1 normal, S2 normal, normal heart sounds and intact distal pulses.  An  irregularly irregular rhythm present. Exam reveals no gallop and no friction rub.   No murmur heard. Initially was in a regular rhythm, converted to normal sinus rhythm during his office visit  Pulmonary/Chest: Effort normal and breath sounds normal. No respiratory distress. He has no wheezes. He has no rales. He exhibits no tenderness.  Abdominal: Soft. Bowel sounds are normal. He exhibits no distension. There is no tenderness.  Musculoskeletal: Normal range of motion. He exhibits no edema and no tenderness.  Lymphadenopathy:    He has no cervical adenopathy.  Neurological: He is alert and oriented to person, place, and time. Coordination normal.  Skin: Skin is warm and dry. No rash noted. No erythema.  Psychiatric: He has a normal mood and affect. His behavior is normal. Judgment and thought content normal.      Assessment and Plan

## 2014-07-22 NOTE — Patient Instructions (Signed)
You are doing well. No medication changes were made.  You are in and out of atrial fibrillation today Now normal rhythm  We will talk with Dr. Rayann Heman about your medications  Please call us if you have new issues that need to be addressed before your next appt.  Your physician wants you to follow-up in: 6 months.  You will receive a reminder letter in the mail two months in advance. If you don't receive a letter, please call our office to schedule the follow-up appointment.

## 2014-07-22 NOTE — Assessment & Plan Note (Signed)
Encouraged him to stay on his Lipitor 

## 2014-07-22 NOTE — Assessment & Plan Note (Signed)
Status post ablation, no recurrent flutter

## 2014-07-22 NOTE — Assessment & Plan Note (Signed)
He reports that he stop smoking. I recommended continued smoking cessation

## 2014-07-24 ENCOUNTER — Encounter: Payer: Self-pay | Admitting: Cardiovascular Disease

## 2014-07-27 ENCOUNTER — Telehealth: Payer: Self-pay

## 2014-07-27 DIAGNOSIS — I480 Paroxysmal atrial fibrillation: Secondary | ICD-10-CM

## 2014-07-27 MED ORDER — FLECAINIDE ACETATE 100 MG PO TABS: 100.0000 mg | ORAL_TABLET | Freq: Two times a day (BID) | ORAL | Status: DC

## 2014-07-27 NOTE — Telephone Encounter (Signed)
Message copied by Stana Bunting on Mon Jul 27, 2014 11:58 AM ------      Message from: Minna Merritts      Created: Mon Jul 27, 2014  7:51 AM                   ----- Message -----         From: [name], MD         Sent: 07/25/2014  10:28 AM           To: Minna Merritts, MD            I dont see that he has CAD.  His EF looks normal and AV conduction in tact on ekg.      Ok to stop multaq and start flecainide 100mg  BID.  If he tolerates flecainide and it works, would perform GXT in 2-3 weeks after starting.      If he continues to have af, we should consider afib ablation.                  Thanks!            ----- Message -----         From: Minna Merritts, MD         Sent: 07/22/2014   8:00 PM           To: [name], MD            Ellwood Dense,      He performed flutter ablation on this gentleman. Now with paroxysmal atrial fibrillation. What you think about changing his multaq to flecainide twice a day. He was previously on amiodarone and did well as well.      thx      tim       ------

## 2014-07-27 NOTE — Progress Notes (Signed)
For paroxysmal atrial fib Would suggest to my Carl Alvarez that he stop  multaq.  Start flecainide 100 mg po BID He will need a treadmill in 2 weeks (to evaluate for arrhythmia). Follow up one month

## 2014-07-27 NOTE — Telephone Encounter (Signed)
Spoke w/ pt.  Advised him of Dr. Donivan Scull recommendation.   He verbalizes understanding and is agreeable, though he does voice concern over the potential cost of flecainide.  He states that he will call his pharmacy to see if he can afford this and let me know if he cannot.

## 2014-08-02 ENCOUNTER — Emergency Department: Payer: Self-pay | Admitting: Emergency Medicine

## 2014-08-10 ENCOUNTER — Encounter: Payer: Self-pay | Admitting: Cardiovascular Disease

## 2014-08-10 ENCOUNTER — Encounter: Payer: Medicare Other | Admitting: Cardiovascular Disease

## 2014-08-10 NOTE — Procedures (Deleted)
Exercise Treadmill Test  Pre-Exercise Testing Evaluation Rhythm: {CHL RHYTHM BASELINE EKG FOR YWV:37106269}  Rate: {CHL RATE BASELINE EKG FOR ETT:21021048}   PR:  {CHL PR BASELINE EKG FOR ETT:21021049} QRS:  {CHL QRS BASELINE EKG FOR ETT:21021050}  QT:  {CHL QT BASELINE EKG FOR ETT:21021051} QTc: {CHL QTC BASELINE EKG FOR ETT:21021052}   P axis: {CHL AXIS BASELINE EKG FOR ETT:21021053}  QRS axis:  {CHL AXIS BASELINE EKG FOR ETT:21021053}  ST Segments:  {CHL ST SEGMENTS BASELINE EKG FOR SWN:46270350}     Test  Exercise Tolerance Test Ordering MD: {CHL LB CARDIOLOGY MD FOR KXF:81829937}  Interpreting MD: {CHL LB CARDIOLOGY INTERPRET MD FOR JIR:67893810}  Unique Test No: ***  Treadmill:  {CHL TREADMILL # FOR FBP:10258527}  Indication for ETT: {CHL INDICATION FOR POE:42353614}  Contraindication to ETT: {CHL CONTRAINDICATION TO ERX:54008676}   Stress Modality: {CHL STRESS MODALITY FOR PPJ:09326712}  Cardiac Imaging Performed: {CHL CARDIAC IMAGING PERFORMED FOR WPY:09983382}   Protocol: {CHL PROTOCOL FOR ETT:21021061}  Max BP:  ***/***  Max MPHR (bpm):  *** 85% MPR (bpm):  ***  MPHR obtained (bpm):  *** % MPHR obtained:  ***  Reached 85% MPHR (min:sec):  *** Total Exercise Time (min-sec):  ***  Workload in METS:  *** Borg Scale: ***  Reason ETT Terminated:  {CHL REASON TERMINATED FOR ETT:21021064}    ST Segment Analysis At Rest: {CHL ST SEGMENT AT REST FOR NKN:39767341} With Exercise: {CHL ST SEGMENT WITH EXERCISE FOR PFX:90240973}  Other Information Arrhythmia:  {CHL ARRHYTHMIA FOR ZHG:99242683} Angina during ETT:  {CHL ANGINA DURING MHD:62229798} Quality of ETT:  {CHL QUALITY OF XQJ:19417408}  ETT Interpretation:  {CHL INTERPRETATION FOR XKG:81856314}  Comments: ***  Recommendations: ***

## 2014-08-13 ENCOUNTER — Other Ambulatory Visit: Payer: Self-pay

## 2014-08-13 ENCOUNTER — Ambulatory Visit (INDEPENDENT_AMBULATORY_CARE_PROVIDER_SITE_OTHER): Payer: Medicare Other | Admitting: Cardiovascular Disease

## 2014-08-13 ENCOUNTER — Encounter: Payer: Self-pay | Admitting: Cardiovascular Disease

## 2014-08-13 VITALS — Ht 69.0 in | Wt 194.0 lb

## 2014-08-13 DIAGNOSIS — I480 Paroxysmal atrial fibrillation: Secondary | ICD-10-CM

## 2014-08-13 DIAGNOSIS — I4891 Unspecified atrial fibrillation: Secondary | ICD-10-CM

## 2014-08-13 NOTE — Patient Instructions (Signed)
You are in afib  Please try Mucinex for your congestion symptoms Avoid mowing the lawn or using tire shine until your symptoms improve  Please follow up with Dr. Rockey Situ in 2-3 weeks

## 2014-08-13 NOTE — Procedures (Signed)
Exercise Treadmill Test  Treadmill ordered for recent epsiodes of chest pain.  Resting EKG shows atrial fibrillation with ventricular rate 120 beats per minute, no significant ST or T wave changes Resting blood pressure of 141/93 Stand bruce protocal was used.  Patient exercised for 9 min 40 sec,  Peak heart rate of 169 bpm.  This was 111% of the maximum predicted heart rate (151). Achieved 4.3 METS No symptoms of chest pain or lightheadedness were reported at peak stress or in recovery.  Peak Blood pressure recorded was 160/109 Heart rate at 3 minutes in recovery was 107 bpm.  FINAL IMPRESSION: Treadmill study was terminated only after 2 minutes The patient presented in atrial fibrillation. Rapid acceleration of heart rate with minimal exertion. Because of this the test was terminated. He was relatively asymptomatic. No significant EKG changes concerning for ischemia. It was recommended that he restart his flecainide and beta blocker this morning with his normal dose this evening. Followup arranged in clinic. He has a history of paroxysmal atrial fibrillation and is typically relatively asymptomatic.

## 2014-08-27 ENCOUNTER — Ambulatory Visit (INDEPENDENT_AMBULATORY_CARE_PROVIDER_SITE_OTHER): Payer: Medicare Other | Admitting: Cardiovascular Disease

## 2014-08-27 ENCOUNTER — Encounter: Payer: Self-pay | Admitting: Cardiovascular Disease

## 2014-08-27 VITALS — BP 140/72 | HR 64 | Ht 67.0 in | Wt 198.5 lb

## 2014-08-27 DIAGNOSIS — I10 Essential (primary) hypertension: Secondary | ICD-10-CM

## 2014-08-27 DIAGNOSIS — E785 Hyperlipidemia, unspecified: Secondary | ICD-10-CM

## 2014-08-27 DIAGNOSIS — I4891 Unspecified atrial fibrillation: Secondary | ICD-10-CM

## 2014-08-27 DIAGNOSIS — I480 Paroxysmal atrial fibrillation: Secondary | ICD-10-CM

## 2014-08-27 DIAGNOSIS — F17200 Nicotine dependence, unspecified, uncomplicated: Secondary | ICD-10-CM

## 2014-08-27 DIAGNOSIS — Z720 Tobacco use: Secondary | ICD-10-CM

## 2014-08-27 MED ORDER — ATORVASTATIN CALCIUM 20 MG PO TABS: 20.0000 mg | ORAL_TABLET | Freq: Every day | ORAL | Status: DC

## 2014-08-27 MED ORDER — APIXABAN 5 MG PO TABS: 5.0000 mg | ORAL_TABLET | Freq: Two times a day (BID) | ORAL | Status: DC

## 2014-08-27 MED ORDER — METOPROLOL TARTRATE 50 MG PO TABS: 50.0000 mg | ORAL_TABLET | Freq: Two times a day (BID) | ORAL | Status: DC

## 2014-08-27 MED ORDER — FLECAINIDE ACETATE 100 MG PO TABS: 100.0000 mg | ORAL_TABLET | Freq: Two times a day (BID) | ORAL | Status: DC

## 2014-08-27 NOTE — Patient Instructions (Addendum)
You are doing well. No medication changes were made.  Please call us if you have new issues that need to be addressed before your next appt.  Your physician wants you to follow-up in: 6 months.  You will receive a reminder letter in the mail two months in advance. If you don't receive a letter, please call our office to schedule the follow-up appointment.  Your next appointment will be scheduled in our new office located at :  [facility]  [address], [address]  [place], [place] [address]  

## 2014-08-27 NOTE — Assessment & Plan Note (Signed)
Recently stop smoking again We have encouraged him to continue to work on weaning his cigarettes and smoking cessation. He will continue to work on this and does not want any assistance with chantix.

## 2014-08-27 NOTE — Assessment & Plan Note (Signed)
Cholesterol is at goal on the current lipid regimen. No changes to the medications were made.  

## 2014-08-27 NOTE — Assessment & Plan Note (Signed)
Blood pressure is well controlled on today's visit. No changes made to the medications. 

## 2014-08-27 NOTE — Assessment & Plan Note (Signed)
He has remained in normal sinus rhythm on flecainide 100 mg twice a day. We'll also continue beta blocker and anticoagulation. No medication changes made

## 2014-08-27 NOTE — Progress Notes (Signed)
   Patient ID: Carl Alvarez, male    DOB: September 03, 1946, 68 y.o.   MRN: [id number]  HPI Comments:  Carl Alvarez is a pleasant 68 yo gentleman with long history of smoking, hypertension, hyperlipidemia with paroxysmal atrial flutter/fibrillation Status post flutter ablation Prior chest x-rays concerning for COPD, ejection fraction 50-55% Prior history of cardioversion while on amiodarone April 2013 Presenting for routine follow-up  In follow-up today, he reports that he feels well. Denies any shortness of breath, palpitations. 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   started on low-dose beta blocker, pradaxa 150 mg b.i.d., DC cardioversion early April 2013 CT Scan of the head did not show any significant stroke  Echocardiogram November 22, 2011 shows ejection fraction 50-55%, otherwise normal study  Followup EKG showed atrial flutter. He was started on amiodarone in an attempt to cardiovert pharmacologically. He remained in atrial flutter and underwent DC cardioversion early April 2013 which was successful.  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 He went home on December 17 with no pain medication. He had 48 hours of excruciating knee pain before restarting pain medication. He started physical therapy. He reports having abnormal rhythm per the PT on December 17 and was sent to the emergency room for further evaluation. EKG in the ER 10/09/2013 showed atrial  flutter.  he was started on pradaxa.   He did not convert to normal sinus rhythm with medical management and underwent cardioversion on 12/02/2013. This was successful in restoring normal sinus rhythm.   Initial EKG showed atrial fibrillation with rate 72 beats per minute. He did not appreciate the arrhythmia    Outpatient Encounter Prescriptions as of 08/27/2014  Medication Sig  . apixaban (ELIQUIS) 5 MG TABS tablet Take 1 tablet (5 mg total) by mouth 2 (two) times daily.  [name] atorvastatin (LIPITOR) 20 MG tablet Take 1 tablet (20 mg total) by mouth daily at 6 PM.  . flecainide (TAMBOCOR) 100 MG tablet Take 1 tablet (100 mg total) by mouth 2 (two) times daily.  . metoprolol (LOPRESSOR) 50 MG tablet Take 1 tablet (50 mg total) by mouth 2 (two) times daily.  . Multiple Vitamin (MULTIVITAMIN) tablet Take 1 tablet by mouth daily.   In terms of her social history  reports that he quit smoking about 10 months ago. His smoking use included Cigarettes. He has a 45 pack-year smoking history. He has never used smokeless tobacco. He reports that he drinks alcohol. He reports that he does not use illicit drugs.  Review of Systems  Constitutional: Negative.   HENT: Negative.   Respiratory: Negative.   Cardiovascular: Positive for leg swelling.  Neurological: Negative.   All other systems reviewed and are negative.   BP 140/72 mmHg  Pulse 64  Ht 5\' 7"  (1.702 m)  Wt 198 lb 8 oz (90.039 kg)  BMI 31.08 kg/m2  Physical Exam

## 2014-10-01 ENCOUNTER — Encounter (HOSPITAL_COMMUNITY): Payer: Self-pay | Admitting: Internal Medicine

## 2015-02-14 NOTE — Consult Note (Signed)
General Aspect 69 year old [place] American male who presents with chief complaint of right upper and lower extremity numbness and tingling, some facial numbness, starting at 7:30pm, found to be in atrial fibrillation. Cardiology was consulted for atrial fibrillation.  He had been in good health apart from some b/l should pain, which has been chronic. He does report some palpiations. Symptoms resolved by 8 o'clock. The patient denies any weakness, visual problems, diplopia, or dysarthria or any slurred speech.  He stopped taking aspirin and his cholesterol medications two days ago.   The patient reports that he had a stress test seven years ago at [facility] which was negative.   PAST MEDICAL HISTORY:  Hyperlipidemia.  Smoking hx Hyperlipidemia  ALLERGIES: Penicillin.   CURRENT MEDICATIONS:  1. Lovastatin. 2. Azithromycin. 3. Aspirin 81 mg p.o. daily.    SOCIAL HISTORY: The patient denies any tobacco abuse, alcohol abuse, or drug abuse.   FAMILY HISTORY: The patient's brother died in his 7???s from alcoholism. Father is deceased, had high blood pressure. Mother deceased, had high blood pressure.   Physical Exam:   GEN WD, WN, NAD    HEENT red conjunctivae    NECK supple  No masses     RESP normal resp effort  clear BS     CARD Irregular rate and rhythm  No murmur     ABD denies tenderness  soft     SKIN normal to palpation    NEURO motor/sensory function intact    PSYCH alert, A+O to time, place, person, good insight   Review of Systems:   Subjective/Chief Complaint extremity weakness, tingling, palpitations    General: No Complaints     Skin: No Complaints     ENT: No Complaints     Eyes: No Complaints     Respiratory: No Complaints     Cardiovascular: Palpitations     Gastrointestinal: No Complaints     Genitourinary: No Complaints     Vascular: No Complaints     Musculoskeletal: No Complaints     Neurologic: tingling of arma, leg, face      Hematologic: No Complaints     Endocrine: No Complaints     Psychiatric: No Complaints     Review of Systems: All other systems were reviewed and found to be negative     Medications/Allergies Reviewed Medications/Allergies reviewed     (Removed):        Admit Diagnosis:   TIA CONDITION GUARDED: 22-Nov-2011, Active, TIA CONDITION GUARDED      Admit Reason:   Unspecified cerebral artery occlusion with cerebral infarction: (434.91) 22-Nov-2011, Active, ICD9, Unspecified cerebral artery occlusion with cerebral infarction, Auto-generated by [facility] Based on Admission Order  Home Medications: Medication Instructions Status  lovastatin  orally  Active  aspirin 81 mg oral tablet, chewable 1 tab(s) orally once a day Active  azithromycin  orally  Active     Cardiology:  29-Jan-13 21:23    Ventricular Rate 95   Atrial Rate 94   QRS Duration 94   QT 334   QTc 419   R Axis -9   T Axis 136  Routine Hem:  29-Jan-13 21:29    WBC (CBC) 10.6   RBC (CBC) 4.12   Hemoglobin (CBC) 13.7   Hematocrit (CBC) 39.4   Platelet Count (CBC) 152   MCV 96   MCH 33.2   MCHC 34.7   RDW 12.9  Routine Chem:  29-Jan-13 21:29    Glucose, Serum 87   BUN  16   Creatinine (comp) 0.93   Sodium, Serum 146   Potassium, Serum 4.6   Chloride, Serum 106   CO2, Serum 30   Calcium (Total), Serum 9.0   Anion Gap 10   Osmolality (calc) 291   eGFR (African American) >60   eGFR (Non-African American) >60  Cardiac:  29-Jan-13 21:29    Troponin I < 0.02  Routine Coag:  29-Jan-13 21:29    Activated PTT (APTT) 30.0   EKG:   Interpretation EKG shows atrial fib with rate of 78 bpm , no significant ST or T wave changes second EKG with rate of 95 bpm   Radiology Results:  XRay:    29-Jan-13 22:49, Chest PA and Lateral   Chest PA and Lateral    REASON FOR EXAM:    cva sx  COMMENTS:       PROCEDURE: DXR - DXR CHEST PA (OR AP) AND LATERAL  - Nov 21 2011 10:49PM     RESULT: There is mild hyperinflation  of the lungs. The cardiac silhouette   is normal in size. There is no edema, infiltrate, effusion or mass. The   bony and mediastinal structures appear unremarkable.    IMPRESSION:   1. No acute cardiopulmonary disease. Hyperinflation possibly secondary to   COPD.          Verified By: Sundra Aland, M.D., MD  Cardiology:    30-Jan-13 10:16, Echo Doppler   Echo Doppler    Interpretation Summary    Rhythm noted to be atrial fibrillation. Left ventricular systolic   function is low normal. Ejection Fraction = 50-55%. There is mild   concentric left ventricular hypertrophy. The right ventricular   systolic function is normal. The left atrial size is normal. Right   ventricular systolic pressure is normal.    Procedure:    A two-dimensional transthoracic echocardiogram with color flow and   Doppler was performed.    The study was completed  in the intensive care unit.    Left Ventricle    The left ventricle is normal in size.    There is mild concentric left ventricular hypertrophy.    Left ventricular systolic function is low normal.    Ejection Fraction = 50-55%.    The left ventricular wall motion is normal.    Right Ventricle    The right ventricle is normal size.    The right ventricular systolic function is normal.    Atria    The left atrial size is normal.    Right atrial size is normal.    There is no Doppler evidence for an atrial septal defect.    Mitral Valve    The mitral valve leaflets appear normal. There is no evidence of   stenosis, fluttering, or prolapse.    There is no mitral valve stenosis.    There is mild mitral regurgitation.    Tricuspid Valve    The tricuspid valve is normal.  There is mild tricuspid regurgitation.    Right ventricular systolic pressure is normal.    Aortic Valve    The aortic valve opens well.    No aortic regurgitation is present.    No hemodynamically significant valvular aortic stenosis.    Pulmonic Valve     Mild pulmonic valvular regurgitation.    The pulmonic valve is not well visualized.    Great Vessels    The aortic root is normal size.    The pulmonary is not well visualized.  Pericardium/Pleural    There is no pleural effusion.    No pericardial effusion.    MMode 2D Measurements and Calculations    RVDd: 2.7 cm    IVSd: 1.3 cm    LVIDd: 4.6 cm    LVIDs: 3.3 cm    LVPWd: 1.2 cm    FS: 27 %    EF(Teich): 53 %    Ao root diam: 3.3 cm    LA dimension: 3.5 cm    LVOT diam: 2.1 cm    Doppler Measurements and Calculations    MV E point: 74 cm/sec    MV dec time: 0.20 sec    Ao V2 max: 72 cm/sec    Ao max PG: 2.0 mmHg    AVA(V,D): 3.5 cm2    LV max PG: 2.0 mmHg    LV V1 max: 74 cm/sec    PA V2 max: 77 cm/sec    PA max PG: 2.0 mmHg    TR Max vel: 202 cm/sec    TR Max PG: 16 mmHg    RVSP: 21 mmHg    RAP systole: 5.0 mmHg    Reading Physician: Ida Rogue   Sonographer: Sherrie Sport  Interpreting Physician:  Ida Rogue,  electronically signed on   11-23-2011 08:18:32  Requesting Physician: Ida Rogue  CT:    29-Jan-13 22:43, CT Head Without Contrast   CT Head Without Contrast    REASON FOR EXAM:    r arm weakness/numbness  COMMENTS:       PROCEDURE: CT  - CT HEAD WITHOUT CONTRAST  - Nov 21 2011 10:43PM     RESULT: Noncontrast emergent CT of the brain is performed in the standard   fashion. There is no previous examination for comparison.    The ventricles and sulci are normal. There is no hemorrhage. There is no   focal mass, mass-effect or midline shift. There is no evidence of edema   or territorial infarct. The bone windows demonstrate normal aeration of   the paranasal sinuses and mastoid air cells. There is no skull fracture   demonstrated.    IMPRESSION:    1. No acute intracranial abnormality.          Verified By: Sundra Aland, M.D., MD    Penicillin: Rash  Vital Signs/Nurse's Notes: **Vital Signs.:   30-Jan-13 05:22    Vital Signs Type Routine   Temperature Temperature (F) 98.1   Celsius 36.7   Temperature Source oral   Pulse Pulse 76   Pulse source per cardiac monitor   Respirations Respirations 19   Systolic BP Systolic BP 259   Diastolic BP (mmHg) Diastolic BP (mmHg) 87   Mean BP 104   BP Source non-invasive   Pulse Ox % Pulse Ox % 98   Pulse Ox Activity Level  At rest   Oxygen Delivery Room Air/ 21 %   Pulse Ox Heart Rate 82   Nurse Fingerstick (mg/dL) FSBS (fasting range 65-99 mg/dL) 98   Comments/Interventions  Per Hyperglycemia Protocol ([facility])     Impression 69 year old [place] American male who presents with chief complaint of right upper and lower extremity numbness and tingling, some facial numbness, starting at 7:30pm, found to be in atrial fibrillation.  A/P: 1) Atrial fib: Would start low dose metoprolol tartrate for rate control Start pradaxa 150 mg po BID for anticaogulation. Follow up in clinic for further management May benefit from outpt cardioversion  2) TIA: CT and MRI negative Would start pradaxa  3) Hyperlipidemia Would continue lovastatin  4) Smoking Encouraged smoking cessation   Electronic Signatures: Ida Rogue (MD)  (Signed 08-Feb-13 13:38)  Authored: General Aspect/Present Illness, History and Physical Exam, Review of System, Past Medical History, Health Issues, Home Medications, Labs, EKG , Radiology, Allergies, Vital Signs/Nurse's Notes, Impression/Plan   Last Updated: 08-Feb-13 13:38 by Ida Rogue (MD)

## 2015-02-14 NOTE — Discharge Summary (Signed)
PATIENT NAME:  Carl Alvarez, Carl Alvarez MR#:  [id number] DATE OF BIRTH:  04/17/1946  DATE OF ADMISSION:  11/22/2011 DATE OF DISCHARGE:  11/22/2011  ADMITTING PHYSICIAN: Dr. Lenore Cordia   DISCHARGING PHYSICIAN:  Dr. Deanne Coffer REFERRING PHYSICIAN: Dr. Cinda Quest   ADMITTING DIAGNOSIS: Right upper extremity tingling as well as right facial tingling.   DISCHARGE DIAGNOSES:  1. New onset atrial fibrillation.  2. Possible transient ischemic attack.  3. Hyperlipidemia.  4. Hypernatremia.  5. Smoking.   CONSULTANTS:  1. Dr. Rockey Situ.  2. Case management.  3. Dr. Michaela Corner.   TESTS DONE DURING THIS HOSPITALIZATION:  1. Chest x-ray on 11/21/2011 showed no acute cardiopulmonary disease.  2. CT of the head 01/29 showed no acute intracranial abnormality.  3. MRI of the brain, cervical spine 01/30 showed very mild involutional changes without evidence of focal acute abnormalities.  4. CT of cervical spine 01/30: Multilevel degenerative disk changes with areas of mild/moderate thecal sac stenosis and areas of neuroforaminal narrowing.  5. Ultrasound and carotid Dopplers 01/30 showed no flow-limiting stenosis.  6. Echo Doppler 01/30 performed by Dr. Ida Rogue showed rhythm noted to be atrial fibrillation with left ventricular systolic function low normal, ejection fraction 55%. There is mild concentric left ventricular hypertrophy. Right ventricular systolic function is normal. Left atrial size is normal. Right ventricular systolic pressure is normal.   HOSPITAL COURSE: Initial History and Physical were done by Dr. Lenore Cordia. Please refer to his note dated  11/22/2011 for complete details.  In brief this is a 69 year old African American male with past medical history of smoking and hyperlipidemia who presents with right upper extremity and facial  tingling. He also was found to be in new onset atrial fibrillation. He was admitted to the hospitalist service.  1. For his possible transient ischemic  attack he was treated initially with Aggrenox. We discharged him on aspirin and Pradaxa. He was seen by Dr. Loletta Specter who recommended MRI. The patient has a history of right shoulder surgery and this could have just been manifestations musculoskeletal disease.  I am not sure he truly had a transient ischemic attack but he does have risk factors- hyperlipidemia, smoking for many years, and new onset atrial fibrillation.  2. Atrial fibrillation. He was on heparin nomogram initially. We discontinued this and changed to Pradaxa.  He had an echo which did show atrial fibrillation. He was started on metoprolol and will follow up with Dr. Rockey Situ as outpatient.  3. Hyponatremia. He received IV fluids.  4. Hyperlipidemia. The patient was continued on statin. He had LDL checked, which was found to be 120. He was counseled about diet and exercise.  5. Smoking. The patient was counseled about cessation for over three minutes. 6. Deep vein thrombosis prophylaxis maintained with heparin nomogram and Pradaxa.   The patient was discharged on 11/22/2011. Temperature 97.7, heart rate 77, respiratory rate 18, blood pressure 122/83, sating 98% on room air.  Lungs were clear to auscultation.  Cardiovascular irregularly irregular consistent with atrial fibrillation.   DISCHARGE MEDICATIONS:  1. Aspirin 81 mg daily.  2. Lovastatin 20 mg daily. 3. Pradaxa 150 mg p.o. b.i.d.  4. Metoprolol 12.5 mg twice daily.   DIET: Low sodium, low fat diet.   ACTIVITY: As tolerated.  The patient is to return to work on 11/24/2011.    FOLLOWUP:   1. He is to see Dr. Rockey Situ in one week.  2. He is to Dr. Gilford Rile Dr. Derrel Nip in one week, primary care physician.  Thank you for allowing me to participate in the care of this patient. Total Time: 40 minutes.  CODE STATUS:  FULL CODE.  TOTAL TIME SPENT ON DISCHARGE: 40 minutes.     ____________________________ Judeth Horn Royden Purl, MD aaf:bjt D: 11/23/2011 15:16:00 ET T: 11/24/2011  13:01:15 ET JOB#: [id number]  cc: Mike Craze A. Royden Purl, MD, <Dictator> Rudell Cobb. Loletta Specter, MD Minna Merritts, MD Deborra Medina, MD Dr. Levin Erp MD ELECTRONICALLY SIGNED 11/25/2011 21:57

## 2015-02-14 NOTE — Consult Note (Signed)
PATIENT NAME:  Carl Alvarez, Carl Alvarez MR#:  [id number] DATE OF BIRTH:  [birth year]  DATE OF CONSULTATION:  11/22/2011  REFERRING PHYSICIAN:  Dr. Posey Pronto  CONSULTING PHYSICIAN:  Rudell Cobb. Loletta Specter, MD  HISTORY: Mr. Inclan is a 69 year old right-handed African American patient of Dr. 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 He was admitted early 11/22/2011 and is referred for evaluation of suspected TIA History comes from the patient and from his hospital chart.   The patient was brought to the Emergency Room at approximately 9:00 p.m., and 11/20/2011 by EMTs summoned by the patient with concern regarding right upper extremity symptoms. Reports when he got home from work, he had some soreness of the right shoulder. Later in the evening, while reclining back on the couch and watching television after eating supper, had the onset of tingling and tightness numbness of the right shoulder which spread down the right arm to the hand where he had tingling of the fingers of the right hand, as well as development of aching in the right posterolateral neck. Reports that he could move the right arm and hand fine. Asked whether there was any heaviness, he thought there might of been some slight heaviness but was not sure. Patient reports that along with right arm symptoms, he developed also some slight tingling of the right great toe. He denies any symptoms of the right face or right leg.   Wonder whether symptoms might be associated with his position on the sofa with head turned to the left watching television, he tried changing position to lie on the sofa with TV ahead of him, but there was no change. He called EMTs.   On arriving in the Emergency Room, he reports his symptoms began to decrease and that they cleared before he was seen by the admitting physician. He reports  return of all symptoms about 10 minutes at the time of being moved from the Emergency Room to the hospital ward. He reports no prior similar spells. Does report occasionally having a crick of the neck, wondering whether he has "slept wrong". He reports occasionally having some tingling in the left great toe with activities at work.   PHYSICAL EXAMINATION: Patient is a well-developed and well-nourished African American gentleman who was examined lying semisupine in the Critical Care Unit with blood pressure 135/85 and heart rate 80. There was no fever. He was normocephalic without evidence of trauma. His neck was supple with fairly good range of motion and no related pain. Mental status was normal, though cognitive testing was not performed in detail. He was alert and oriented with clear speech and normal expression and had no difficulty following commands. He was lucid and a good historian with normal affect. Cranial nerve examination was symmetric and normal including facial sensation and movement, eye movements, visual field to finger count for each eye. Motor examination of the extremities showed normal tone and bulk throughout and symmetric full power throughout. Extremity sensation and coordination were normal. Reflexes were symmetric and rated 2 to 3+ throughout. His gait was not tested.   IMPRESSION: Episodes of right shoulder and right upper extremity tingling and tightness feeling with tingling of right fingers, and ache in posterolateral neck the evening of 11/21/2011, resolved, and suggestive of a right cervical radiculitis with differential diagnosis including possible transient ischemic attack.   RECOMMENDATIONS:  1. I agree with his  present work-up including order for MRI scan of the brain.  2. He will have order as well for MRI scan of the cervical spine.  3. Bolster cervical pillow.   I appreciate being asked to see this pleasant and interesting gentleman. I will follow up with him after  reviewing results of his MRI scans.    ____________________________ Rudell Cobb. Loletta Specter, MD prc:cms D: 11/22/2011 12:30:31 ET T: 11/22/2011 13:33:53 ET JOB#: [id number]  cc: Rudell Cobb. Loletta Specter, MD, <Dictator> Kirstie Peri. Caryn Section, MD  Linton Flemings MD ELECTRONICALLY SIGNED 11/24/2011 14:41

## 2015-02-14 NOTE — H&P (Signed)
PATIENT NAME:  Carl Alvarez, COMP MR#:  [id number] DATE OF BIRTH:  1946-05-19  DATE OF ADMISSION:  11/22/2011  PRIMARY CARE PHYSICIAN: The patient is unassigned to the hospitalist service.   EMERGENCY ROOM PHYSICIAN: Dr. Cinda Quest    CHIEF COMPLAINT: Right upper and right lower extremity numbness and tingling.   HISTORY OF PRESENT ILLNESS: The patient is a 69 year old African American male who presents with chief complaint of right upper extremity right lower extremity numbness and tingling. Symptoms began about 7:30 at night while the patient was resting. Symptoms resolved by 8 o'clock. The patient denies any weakness. Denies any visual problems, diplopia, or dysarthria. Denies any slurred speech. Denies any headache. He reports that he stopped taking aspirin and his cholesterol medications two days ago. The patient reports that he had a stress test seven years ago at [facility] which was negative. Currently he is denying any chest pain. His son requested to speak to me over the phone who is a cardiac nurse and asked if the patient needs a repeat stress test.   PAST MEDICAL HISTORY: Hyperlipidemia.   ALLERGIES: Penicillin.   CURRENT MEDICATIONS:  1. Lovastatin. 2. Azithromycin. 3. Aspirin 81 mg p.o. daily.    SOCIAL HISTORY: The patient denies any tobacco abuse, alcohol abuse, or drug abuse.   FAMILY HISTORY: The patient's brother died in his 60's from alcoholism. Father is deceased, had high blood pressure. Mother deceased, had high blood pressure.   REVIEW OF SYSTEMS: CONSTITUTIONAL: The patient denies any fevers, chills, night sweats. HEENT: The patient denies any hearing loss, dysphagia, visual problems, sore throat. CARDIOVASCULAR: The patient denies any chest pain, orthopnea, or PND. RESPIRATORY: The patient denies any cough, wheezing, or hemoptysis. GI: The patient denies any nausea, vomiting, abdominal pain, hematemesis, hematochezia, or melena. GU: The patient denies any hematuria, dysuria, or  frequency. The patient denies any headache, focal weakness, or seizures. SKIN: The patient denies any lesions or rash. ENDOCRINE: The patient denies polyuria, polyphagia, or polydipsia. MUSCULOSKELETAL: The patient denies any arthritis, joint effusion, or swelling. HEMATOLOGIC: The patient denies any easy bleeding or bruises.    PHYSICAL EXAMINATION:   VITAL SIGNS: Temperature 98.4, heart rate 94, respiratory rate 20, blood pressure 137/81, oxygen sat 98%.   HEENT: Atraumatic, normocephalic. Pupils equal, round, and reactive to light and accommodation. Extraocular movements intact. Sclerae anicteric. Mucous membranes moist.    NECK: Supple. No organomegaly.   CARDIOVASCULAR: S1, S2, regular rate and rhythm. No gallops. No thrills. No murmurs.   RESPIRATORY: Lungs are clear to auscultation. No rales, no rhonchi, no wheezes, no bronchial breath sounds.   GI: Abdomen is soft, nontender, nondistended. Normal bowel sounds. No hepatosplenomegaly.   GU: There is no hematuria or mass noted.   SKIN: No lesions, no rash.   ENDOCRINE: No masses, no thyromegaly.   LYMPH: No lymphadenopathy or nodes palpable.   NEUROLOGIC: Cranial nerves II through XII grossly intact. Motor strength is 5 out of 5 bilateral upper and lower extremities. Sensation within normal limits. No focal neurological deficits noted on examination.   EXTREMITIES: No cyanosis, no clubbing, no edema. 2+ pedal pulses noted bilaterally.   LABORATORY, DIAGNOSTIC, AND RADIOLOGICAL DATA: EKG atrial fibrillation, 95 beats per minute, lateral infarct.   ASSESSMENT AND PLAN:  1. The patient is a 69 year old male who presents with chief complaint of right-sided numbness and tingling consistent with TIA. Admit to telemetry. Start TIA clinical pathway orders. Aggrenox. Check carotid Doppler's, echo, MRI. Neurology consultation.  2. Hypernatremia. Start IV fluids.  Monitor sodium levels closely.  3. Hyperlipidemia. Continue the patient on  lovastatin.  4. Atrial flutter. We will start heparin drip per protocol. We will get Cardiology consultation.   ____________________________ Tyrone Schimke, MD jsp:drc D: 11/22/2011 01:42:07 ET T: 11/22/2011 07:21:07 ET JOB#: [id number]  cc: Tyrone Schimke, MD, <Dictator> Tyrone Schimke MD ELECTRONICALLY SIGNED 11/22/2011 22:02

## 2015-04-13 IMAGING — CR DG CHEST 2V
2 series · 2 of 2 positions shown · non-contrast
Comparison: None.

CLINICAL DATA: Preoperative for right knee arthroplasty

EXAM:
CHEST  2 VIEW

[w chest pa]
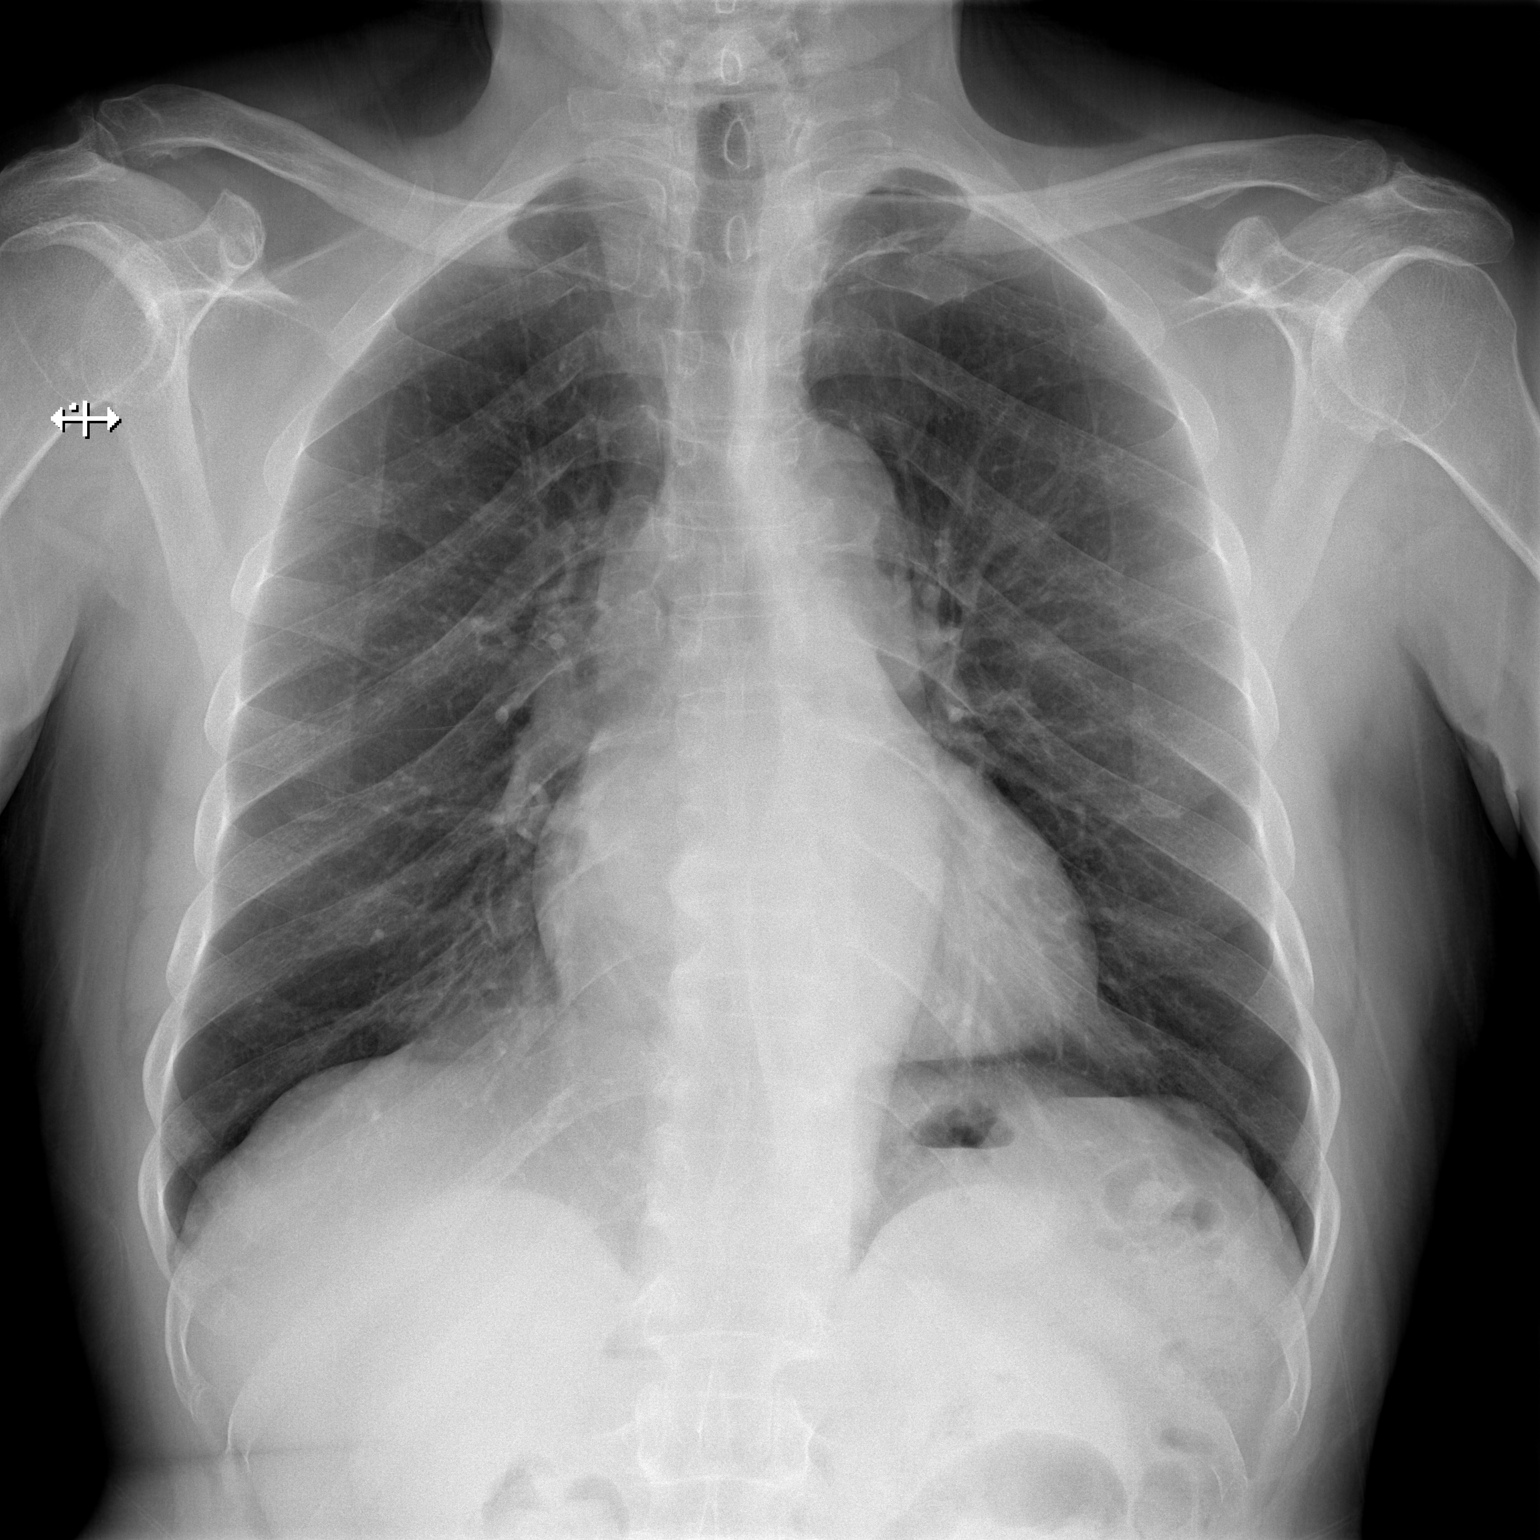

[w chest lat]
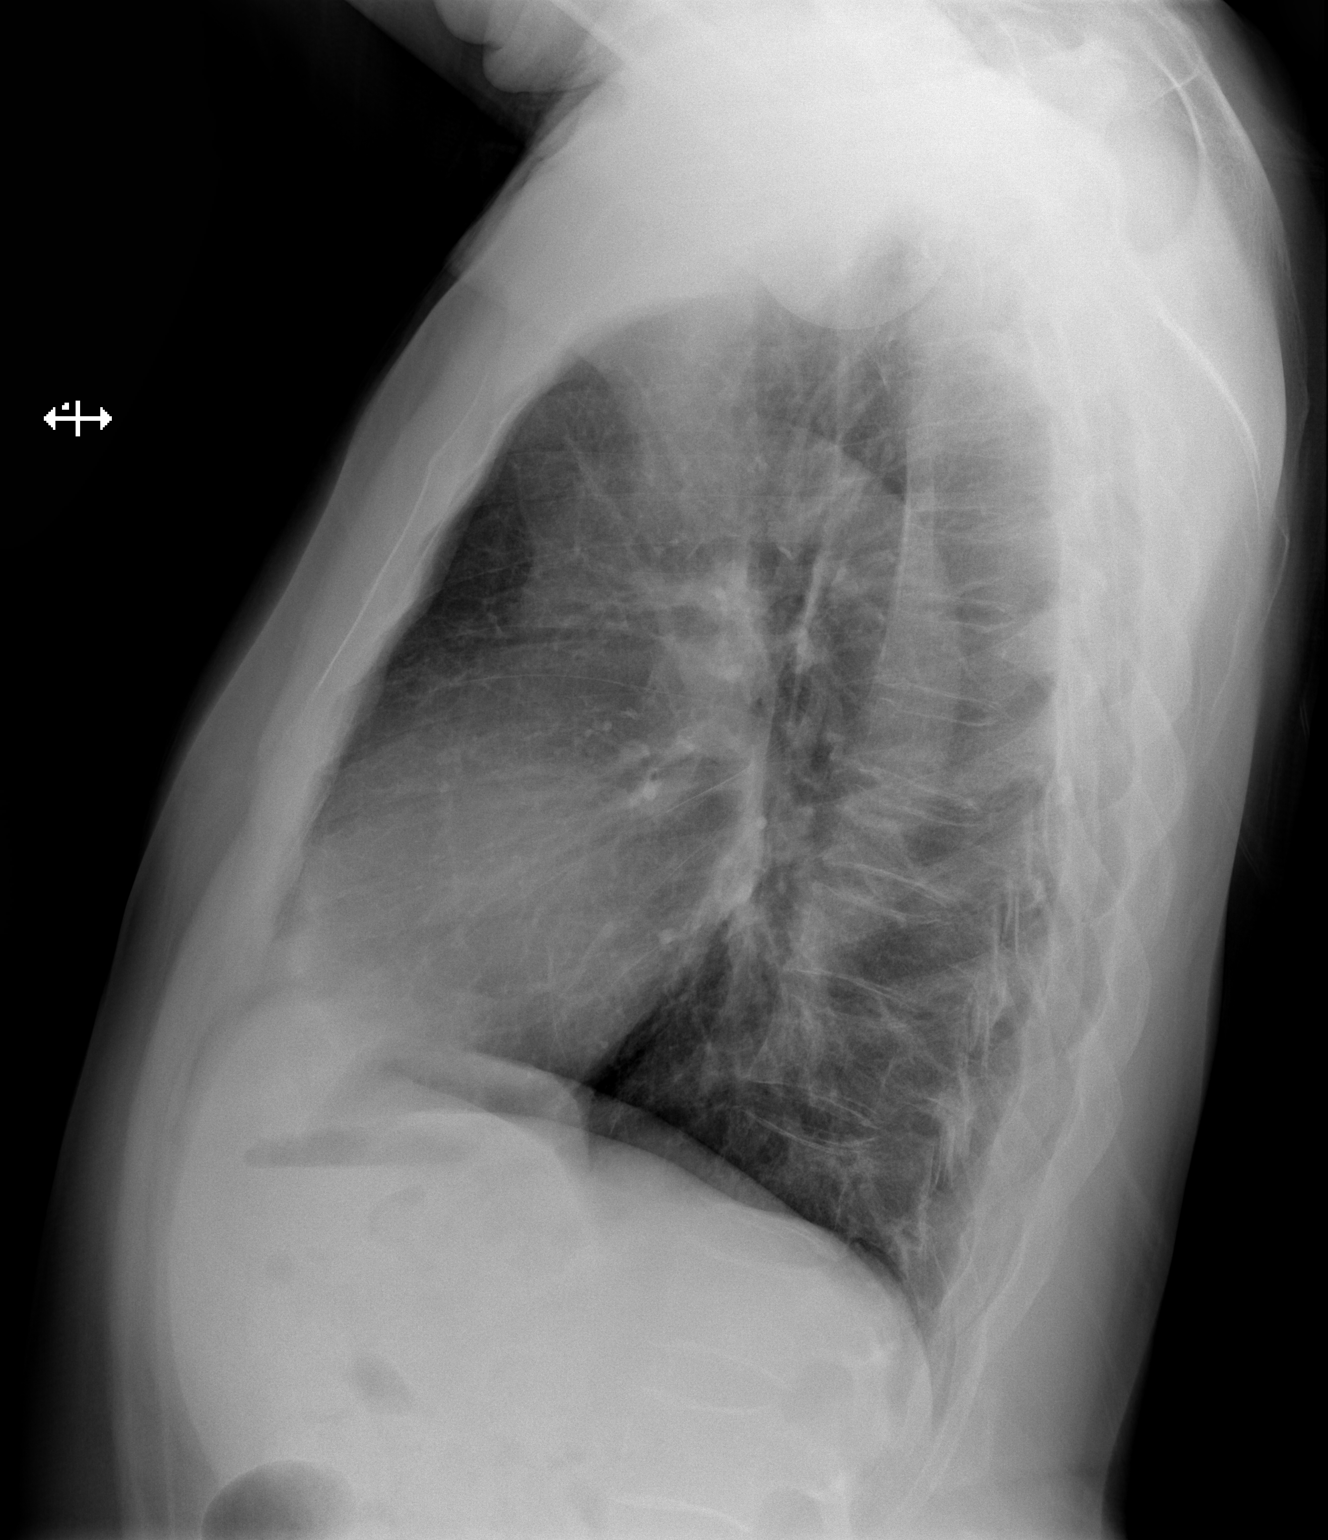

[2 of 2 positions shown; findings below may reference images not displayed]

FINDINGS: The lungs are mildly hyperinflated and clear. The cardiopericardial
silhouette is normal in size. The pulmonary vascularity is not
engorged. The mediastinum is normal in width. There is mild
tortuosity of the descending thoracic aorta. There is no pleural
effusion or pneumothorax. The observed portions of the bony thorax
exhibit no acute abnormalities.
IMPRESSION: There is hyperinflation which may be voluntary or could reflect
underlying COPD. There is no evidence of pneumonia nor CHF.

## 2015-06-29 ENCOUNTER — Ambulatory Visit (INDEPENDENT_AMBULATORY_CARE_PROVIDER_SITE_OTHER): Payer: Medicare Other | Admitting: Cardiovascular Disease

## 2015-06-29 ENCOUNTER — Encounter: Payer: Self-pay | Admitting: Cardiovascular Disease

## 2015-06-29 VITALS — BP 108/62 | HR 96 | Ht 71.0 in | Wt 192.5 lb

## 2015-06-29 DIAGNOSIS — E785 Hyperlipidemia, unspecified: Secondary | ICD-10-CM

## 2015-06-29 DIAGNOSIS — I10 Essential (primary) hypertension: Secondary | ICD-10-CM

## 2015-06-29 DIAGNOSIS — Z720 Tobacco use: Secondary | ICD-10-CM

## 2015-06-29 DIAGNOSIS — F17200 Nicotine dependence, unspecified, uncomplicated: Secondary | ICD-10-CM

## 2015-06-29 DIAGNOSIS — G459 Transient cerebral ischemic attack, unspecified: Secondary | ICD-10-CM

## 2015-06-29 DIAGNOSIS — I4891 Unspecified atrial fibrillation: Secondary | ICD-10-CM | POA: Diagnosis not present

## 2015-06-29 NOTE — Assessment & Plan Note (Signed)
We will discuss having a repeat cholesterol panel in follow-up
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
## 2015-06-29 NOTE — Assessment & Plan Note (Signed)
Blood pressure is well controlled on today's visit. No changes made to the medications. 

## 2015-06-29 NOTE — Patient Instructions (Addendum)
You are in atrial fibrillation No medication changes were made.  Come in for EKG in 2 weeks  Watch for shortness of breath (with exertion), leg swelling, weight gain, Call the office  Please call us if you have new issues that need to be addressed before your next appt.  Your physician wants you to follow-up in: 6 months.  You will receive a reminder letter in the mail two months in advance. If you don't receive a letter, please call our office to schedule the follow-up appointment.

## 2015-06-29 NOTE — Assessment & Plan Note (Signed)
We have discussed his options for atrial fibrillation. He is asymptomatic, unclear how long he has been having atrial fibrillation. He is not clear if it started today. Denies any shortness of breath, no other complaints. Recommended he stay on his current medications for now, repeat EKG in several weeks' time Meanwhile he will monitor for symptoms to determine if he would like to attempt restoring normal sinus rhythm

## 2015-06-29 NOTE — Assessment & Plan Note (Signed)
We have encouraged him to continue to work on weaning his cigarettes and smoking cessation. He will continue to work on this and does not want any assistance with chantix.  

## 2015-06-29 NOTE — Assessment & Plan Note (Signed)
Encouraged to stay on his anticoagulation especially in light of his recurrent atrial fibrillation

## 2015-08-20 ENCOUNTER — Telehealth: Payer: Self-pay | Admitting: Cardiovascular Disease

## 2015-08-20 DIAGNOSIS — S42213A Unspecified displaced fracture of surgical neck of unspecified humerus, initial encounter for closed fracture: Secondary | ICD-10-CM | POA: Insufficient documentation

## 2015-08-20 DIAGNOSIS — S60219A Contusion of unspecified wrist, initial encounter: Secondary | ICD-10-CM | POA: Insufficient documentation
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
## 2015-08-31 ENCOUNTER — Other Ambulatory Visit: Payer: Self-pay | Admitting: Cardiovascular Disease

## 2015-09-01 NOTE — Progress Notes (Deleted)
This encounter was created in error - please disregard.  This encounter was created in error - please disregard.

## 2015-09-03 ENCOUNTER — Other Ambulatory Visit: Payer: Self-pay

## 2015-09-03 MED ORDER — ATORVASTATIN CALCIUM 20 MG PO TABS: 20.0000 mg | ORAL_TABLET | Freq: Every day | ORAL | Status: DC

## 2015-09-24 ENCOUNTER — Other Ambulatory Visit: Payer: Self-pay | Admitting: Cardiovascular Disease

## 2015-12-30 ENCOUNTER — Other Ambulatory Visit: Payer: Self-pay | Admitting: Cardiovascular Disease

## 2015-12-30 ENCOUNTER — Other Ambulatory Visit: Payer: Self-pay | Admitting: *Deleted

## 2016-01-01 ENCOUNTER — Other Ambulatory Visit: Payer: Self-pay | Admitting: Cardiovascular Disease

## 2016-01-05 ENCOUNTER — Encounter: Payer: Self-pay | Admitting: Cardiovascular Disease

## 2016-01-05 ENCOUNTER — Ambulatory Visit (INDEPENDENT_AMBULATORY_CARE_PROVIDER_SITE_OTHER): Payer: Medicare Other | Admitting: Cardiovascular Disease

## 2016-01-05 VITALS — BP 132/64 | HR 72 | Ht 71.0 in | Wt 203.5 lb

## 2016-01-05 DIAGNOSIS — I4892 Unspecified atrial flutter: Secondary | ICD-10-CM | POA: Diagnosis not present

## 2016-01-05 DIAGNOSIS — I4891 Unspecified atrial fibrillation: Secondary | ICD-10-CM | POA: Diagnosis not present

## 2016-01-05 DIAGNOSIS — I10 Essential (primary) hypertension: Secondary | ICD-10-CM

## 2016-01-05 DIAGNOSIS — I480 Paroxysmal atrial fibrillation: Secondary | ICD-10-CM | POA: Diagnosis not present

## 2016-01-05 DIAGNOSIS — Z720 Tobacco use: Secondary | ICD-10-CM

## 2016-01-05 DIAGNOSIS — F17200 Nicotine dependence, unspecified, uncomplicated: Secondary | ICD-10-CM

## 2016-01-05 MED ORDER — LEVOFLOXACIN 500 MG PO TABS: 500.0000 mg | ORAL_TABLET | Freq: Every day | ORAL | Status: DC

## 2016-01-05 MED ORDER — METOPROLOL TARTRATE 100 MG PO TABS: 100.0000 mg | ORAL_TABLET | Freq: Two times a day (BID) | ORAL | Status: DC

## 2016-01-05 NOTE — Assessment & Plan Note (Signed)
On his previous clinic visit September 2016 he. To be in atrial fibrillation, now appears to be atrial flutter Tolerating anticoagulation, rate well controlled

## 2016-01-05 NOTE — Assessment & Plan Note (Signed)
Blood pressure is well controlled on today's visit.  We will increase metoprolol as detailed

## 2016-01-05 NOTE — Assessment & Plan Note (Signed)
We have encouraged him to continue to work on weaning his cigarettes and smoking cessation. He will continue to work on this and does not want any assistance with chantix.  

## 2016-01-05 NOTE — Assessment & Plan Note (Signed)
Chronic atrial fibrillation/flutter Asymptomatic, he is not particularly interested in restoring normal sinus rhythm as he feels well Recommended he stop the flecainide, we will increase metoprolol up to 100 mg twice a day for rate control. He'll continue anticoagulation
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
## 2016-01-05 NOTE — Patient Instructions (Addendum)
You are doing well.  Please start levaquin one pill a day for 10 days for sinus  Please stop the flecainide  Increase the metoprolol up 100 mg twice a day  Please call us if you have new issues that need to be addressed before your next appt.  Your physician wants you to follow-up in: 6 months.  You will receive a reminder letter in the mail two months in advance. If you don't receive a letter, please call our office to schedule the follow-up appointment.

## 2016-02-07 ENCOUNTER — Telehealth: Payer: Self-pay | Admitting: Cardiovascular Disease

## 2016-02-07 NOTE — Telephone Encounter (Signed)
Pt c/o of Chest Pain: STAT if CP now or developed within 24 hours  1. Are you having CP right now? Goes and comes, "like a gas tightness"  2. Are you experiencing any other symptoms (ex. SOB, nausea, vomiting, sweating)? A little SOB, nauseated   3. How long have you been experiencing CP? 2 days  4. Is your CP continuous or coming and going? Coming and going  5. Have you taken Nitroglycerin? no ?
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
## 2016-04-12 NOTE — Progress Notes (Signed)
This encounter was created in error - please disregard.

## 2016-05-01 ENCOUNTER — Other Ambulatory Visit: Payer: Self-pay

## 2016-05-01 MED ORDER — APIXABAN 5 MG PO TABS: 5.0000 mg | ORAL_TABLET | Freq: Two times a day (BID) | ORAL | Status: DC

## 2016-05-01 NOTE — Telephone Encounter (Signed)
Refill sent for Eliquis 5 mg  

## 2016-08-29 ENCOUNTER — Telehealth: Payer: Self-pay | Admitting: Cardiovascular Disease

## 2016-08-29 MED ORDER — ATORVASTATIN CALCIUM 20 MG PO TABS: 20.0000 mg | ORAL_TABLET | Freq: Every day | ORAL | 3 refills | Status: DC

## 2016-08-29 NOTE — Telephone Encounter (Signed)
°*  STAT* If patient is at the pharmacy, call can be transferred to refill team.   1. Which medications need to be refilled? (please list name of each medication and dose if known)  Lipitor  2. Which pharmacy/location (including street and city if local pharmacy) is medication to be sent to? [facility]  3. Do they need a 30 day or 90 day supply?  90 day

## 2016-09-04 ENCOUNTER — Other Ambulatory Visit: Payer: Self-pay

## 2016-09-04 MED ORDER — APIXABAN 5 MG PO TABS: 5.0000 mg | ORAL_TABLET | Freq: Two times a day (BID) | ORAL | 3 refills | Status: DC

## 2016-09-07 ENCOUNTER — Encounter: Payer: Self-pay | Admitting: Cardiovascular Disease

## 2016-09-07 ENCOUNTER — Ambulatory Visit (INDEPENDENT_AMBULATORY_CARE_PROVIDER_SITE_OTHER): Payer: Medicare Other | Admitting: Cardiovascular Disease

## 2016-09-07 VITALS — BP 134/80 | HR 82 | Ht 71.0 in | Wt 217.5 lb

## 2016-09-07 DIAGNOSIS — F17200 Nicotine dependence, unspecified, uncomplicated: Secondary | ICD-10-CM | POA: Diagnosis not present

## 2016-09-07 DIAGNOSIS — G459 Transient cerebral ischemic attack, unspecified: Secondary | ICD-10-CM

## 2016-09-07 DIAGNOSIS — I4892 Unspecified atrial flutter: Secondary | ICD-10-CM | POA: Diagnosis not present

## 2016-09-07 DIAGNOSIS — I10 Essential (primary) hypertension: Secondary | ICD-10-CM

## 2016-09-07 DIAGNOSIS — I480 Paroxysmal atrial fibrillation: Secondary | ICD-10-CM | POA: Diagnosis not present

## 2016-09-07 MED ORDER — APIXABAN 5 MG PO TABS
5.0000 mg | ORAL_TABLET | Freq: Two times a day (BID) | ORAL | 12 refills | Status: DC
Start: 2016-09-07 — End: 2017-01-25

## 2016-09-07 MED ORDER — METOPROLOL TARTRATE 100 MG PO TABS: 100.0000 mg | ORAL_TABLET | Freq: Two times a day (BID) | ORAL | 12 refills | Status: DC

## 2016-09-07 MED ORDER — ATORVASTATIN CALCIUM 20 MG PO TABS: 20.0000 mg | ORAL_TABLET | Freq: Every day | ORAL | 12 refills | Status: DC
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
## 2016-09-07 NOTE — Patient Instructions (Addendum)
Medication Instructions:   No medication changes made  Watch the carbs  Labwork:  No new labs needed  Testing/Procedures:  No further testing at this time   I recommend watching educational videos on topics of interest to you at:       [email or web address].goemmi.com  Enter code: [email or web address]    Follow-Up: It was a pleasure seeing you in the office today. Please call us if you have new issues that need to be addressed before your next appt.  [phone]  Your physician wants you to follow-up in: 12 months.  You will receive a reminder letter in the mail two months in advance. If you don't receive a letter, please call our office to schedule the follow-up appointment.  If you need a refill on your cardiac medications before your next appointment, please call your pharmacy.

## 2017-01-25 ENCOUNTER — Other Ambulatory Visit: Payer: Self-pay | Admitting: Cardiovascular Disease

## 2017-01-25 MED ORDER — APIXABAN 5 MG PO TABS: 5.0000 mg | ORAL_TABLET | Freq: Two times a day (BID) | ORAL | 3 refills | Status: DC

## 2017-01-29 ENCOUNTER — Telehealth: Payer: Self-pay | Admitting: Cardiovascular Disease

## 2017-01-29 ENCOUNTER — Ambulatory Visit (INDEPENDENT_AMBULATORY_CARE_PROVIDER_SITE_OTHER): Payer: Medicare Other | Admitting: Nurse Practitioner

## 2017-01-29 ENCOUNTER — Encounter: Payer: Self-pay | Admitting: Nurse Practitioner

## 2017-01-29 VITALS — BP 160/100 | HR 80 | Ht 71.0 in | Wt 220.0 lb

## 2017-01-29 DIAGNOSIS — Z79899 Other long term (current) drug therapy: Secondary | ICD-10-CM

## 2017-01-29 DIAGNOSIS — I481 Persistent atrial fibrillation: Secondary | ICD-10-CM

## 2017-01-29 DIAGNOSIS — I4819 Other persistent atrial fibrillation: Secondary | ICD-10-CM

## 2017-01-29 MED ORDER — CHLORTHALIDONE 25 MG PO TABS: 12.5000 mg | ORAL_TABLET | Freq: Every day | ORAL | 3 refills | Status: DC
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
## 2017-01-29 NOTE — Patient Instructions (Signed)
Medication Instructions:  Your physician has recommended you make the following change in your medication:  1- START Chlorthalidone 12.5 mg (1/2 tablet) by mouth once a day.   Labwork: Your physician recommends that you return for lab work in: on Thursday, February 01, 2017. - Please go to the [facility]. You will check in at the front desk to the right as you walk into the atrium. Valet Parking is offered if needed.   Testing/Procedures: NONE  Follow-Up: Your physician recommends that you schedule a follow-up appointment in: [place], NP ON SCHEDULE FOR 02/16/17 PER CHRIS.  If you need a refill on your cardiac medications before your next appointment, please call your pharmacy.

## 2017-01-29 NOTE — Telephone Encounter (Signed)
Spoke with patient in the lobby. He said he's SOB with walking over the past 2 days.  He checked his BP at the drug store about 15 min after taking BP medications and it was elevated. He could not remember the number. Chest tightness occurred a couple days ago but he is not having it right now. He reports ringing in his ears, dizziness and "blurry" feeling in his head. Patient asked if he could be seen right now. Offered appt with Ignacia Bayley, NP at 2:00pm today. He took that appt and verbalized understanding to go to the ED if new symptoms develop or worsen.

## 2017-01-29 NOTE — Telephone Encounter (Signed)
Walk in- Carl Alvarez walked into the office stating that his BP has been elevated 2 days with shortness of breath. States that he had tightness in chest a couple of days ago.

## 2017-01-30 ENCOUNTER — Encounter: Payer: Self-pay | Admitting: Intensive Care

## 2017-01-30 ENCOUNTER — Emergency Department
Admission: EM | Admit: 2017-01-30 | Discharge: 2017-01-30 | Disposition: A | Discharge status: Home / Self Care | Payer: Medicare Other | Attending: Emergency Medicine | Admitting: Emergency Medicine

## 2017-01-30 DIAGNOSIS — Z87891 Personal history of nicotine dependence: Secondary | ICD-10-CM | POA: Diagnosis not present

## 2017-01-30 DIAGNOSIS — I10 Essential (primary) hypertension: Secondary | ICD-10-CM | POA: Diagnosis not present

## 2017-01-30 DIAGNOSIS — Z79899 Other long term (current) drug therapy: Secondary | ICD-10-CM | POA: Insufficient documentation

## 2017-01-30 DIAGNOSIS — E785 Hyperlipidemia, unspecified: Secondary | ICD-10-CM | POA: Insufficient documentation

## 2017-01-30 NOTE — ED Triage Notes (Addendum)
Patient presents to ER with c/o high blood pressure. Ambulatory in triage with no problems. Patient was seen yesterday by heart doctor for high blood pressure and they told pt to come to hospital today if it was not better and was given medicine yesterday that he is unsure for his pressure. HX A fib. Denies any chest pain or SOB. Blood pressure upon arrival 133/90

## 2017-01-30 NOTE — ED Notes (Signed)
See triage note. States he was placed on some fluid meds for HTN yesterday  Has been checking his b/p intermittently and told to f/u  Denies any dizziness or chest pain
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
## 2017-01-30 NOTE — Discharge Instructions (Signed)
Advised to have home blood pressure monitor synched with family doctors blood pressure machine.

## 2017-01-31 ENCOUNTER — Other Ambulatory Visit: Payer: Self-pay | Admitting: Cardiovascular Disease

## 2017-02-08 ENCOUNTER — Telehealth: Payer: Self-pay | Admitting: *Deleted

## 2017-02-08 NOTE — Telephone Encounter (Signed)
Patient was to get BMP on 02/01/17 per instruction of Ignacia Bayley, NP from [place] on 01/29/17. Called to remind patient as he has not had done yet. Patient stated he decided not to take the chlorthalidone after speaking with his daughter. He says he's doing much better and will come into his f/u appt 02/16/17. Routing to Ignacia Bayley, NP to make him aware.

## 2017-02-16 ENCOUNTER — Encounter: Payer: Self-pay | Admitting: Nurse Practitioner

## 2017-02-16 ENCOUNTER — Telehealth: Payer: Self-pay | Admitting: Nurse Practitioner

## 2017-02-16 ENCOUNTER — Ambulatory Visit (INDEPENDENT_AMBULATORY_CARE_PROVIDER_SITE_OTHER): Payer: Medicare Other | Admitting: Nurse Practitioner

## 2017-02-16 ENCOUNTER — Ambulatory Visit: Payer: Medicare Other | Admitting: Nurse Practitioner

## 2017-02-16 VITALS — BP 156/94 | HR 94 | Ht 71.0 in | Wt 215.2 lb

## 2017-02-16 DIAGNOSIS — I4821 Permanent atrial fibrillation: Secondary | ICD-10-CM

## 2017-02-16 DIAGNOSIS — I10 Essential (primary) hypertension: Secondary | ICD-10-CM

## 2017-02-16 DIAGNOSIS — I482 Chronic atrial fibrillation: Secondary | ICD-10-CM | POA: Diagnosis not present

## 2017-02-16 MED ORDER — BLOOD PRESSURE KIT: 1.0000 | PACK | 0 refills | Status: DC

## 2017-02-16 NOTE — Telephone Encounter (Signed)
Spoke with patient and let him know that I sent in order for blood pressure kit but I was not sure if his insurance would approve it. He verbalized understanding, was appreciative for the call, and had no further questions at this time.

## 2017-02-16 NOTE — Patient Instructions (Signed)
Medication Instructions:  Your physician has recommended you make the following change in your medication:  1. RESTART chlorthalidone   Labwork: Your physician recommends that you return for lab work next Thursday go to [facility] to have BMP done.   Follow-Up: Your physician has requested that you regularly monitor and record your blood pressure readings at home. Please use the same machine at the same time of day to check your readings and record them to bring to your follow-up visit.  Please call on Friday Feb 23, 2017 with blood pressure reading   It was a pleasure seeing you today here in the office. Please do not hesitate to give [place] a call back if you have any further questions. [place], BSN
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
## 2017-02-16 NOTE — Telephone Encounter (Signed)
Pt would like a rx to get a BP cuff. Please call.

## 2017-02-22 ENCOUNTER — Other Ambulatory Visit
Admission: RE | Admit: 2017-02-22 | Discharge: 2017-02-22 | Disposition: A | Discharge status: Home / Self Care | Payer: Medicare Other | Source: Ambulatory Visit | Attending: Nurse Practitioner | Admitting: Nurse Practitioner

## 2017-02-22 DIAGNOSIS — I481 Persistent atrial fibrillation: Secondary | ICD-10-CM | POA: Diagnosis present

## 2017-02-22 DIAGNOSIS — I4819 Other persistent atrial fibrillation: Secondary | ICD-10-CM

## 2017-02-22 DIAGNOSIS — Z79899 Other long term (current) drug therapy: Secondary | ICD-10-CM

## 2017-02-22 LAB — BASIC METABOLIC PANEL
Anion gap: 7 (ref 5–15)
BUN: 22 mg/dL — ABNORMAL HIGH (ref 6–20)
CO2: 31 mmol/L (ref 22–32)
Calcium: 9.6 mg/dL (ref 8.9–10.3)
Chloride: 102 mmol/L (ref 101–111)
Creatinine, Ser: 1.1 mg/dL (ref 0.61–1.24)
GFR calc Af Amer: >60 mL/min (ref >60)
GFR calc non Af Amer: >60 mL/min (ref >60)
Glucose, Bld: 122 mg/dL — ABNORMAL HIGH (ref 65–99)
Potassium: 4.1 mmol/L (ref 3.5–5.1)
Sodium: 140 mmol/L (ref 135–145)

## 2017-03-13 ENCOUNTER — Encounter: Payer: Self-pay | Admitting: Cardiovascular Disease

## 2017-03-13 ENCOUNTER — Ambulatory Visit (INDEPENDENT_AMBULATORY_CARE_PROVIDER_SITE_OTHER): Payer: Medicare Other | Admitting: Cardiovascular Disease

## 2017-03-13 VITALS — BP 140/88 | HR 91 | Ht 71.0 in | Wt 206.5 lb

## 2017-03-13 DIAGNOSIS — I10 Essential (primary) hypertension: Secondary | ICD-10-CM

## 2017-03-13 DIAGNOSIS — I4892 Unspecified atrial flutter: Secondary | ICD-10-CM | POA: Diagnosis not present

## 2017-03-13 DIAGNOSIS — I4891 Unspecified atrial fibrillation: Secondary | ICD-10-CM

## 2017-03-13 MED ORDER — CHLORTHALIDONE 25 MG PO TABS: 25.0000 mg | ORAL_TABLET | Freq: Every day | ORAL | 3 refills | Status: DC | PRN

## 2017-03-13 NOTE — Patient Instructions (Addendum)
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
## 2017-05-30 ENCOUNTER — Other Ambulatory Visit: Payer: Self-pay | Admitting: Cardiovascular Disease

## 2017-07-03 ENCOUNTER — Encounter: Payer: Self-pay | Admitting: Internal Medicine

## 2017-09-19 ENCOUNTER — Other Ambulatory Visit: Payer: Self-pay | Admitting: Cardiovascular Disease

## 2017-09-27 ENCOUNTER — Other Ambulatory Visit: Payer: Self-pay | Admitting: Cardiovascular Disease

## 2017-09-27 NOTE — Telephone Encounter (Signed)
Please review for refill, Thanks !  

## 2017-10-02 ENCOUNTER — Other Ambulatory Visit: Payer: Self-pay | Admitting: Cardiovascular Disease

## 2017-10-02 ENCOUNTER — Telehealth: Payer: Self-pay | Admitting: Nurse Practitioner

## 2017-10-02 ENCOUNTER — Other Ambulatory Visit: Payer: Self-pay | Admitting: Nurse Practitioner

## 2017-10-02 MED ORDER — APIXABAN 5 MG PO TABS: 5.0000 mg | ORAL_TABLET | Freq: Two times a day (BID) | ORAL | 1 refills | Status: DC

## 2017-10-02 NOTE — Telephone Encounter (Signed)
Daughter calling for her dad - he is needing Eliquis refill - he is totally out.   RX for #60 Eliquis 5 mg BID with one refill sent to [facility].   Burtis Junes, RN, [place] [address] [place], [place]  [id number] [phone]

## 2017-11-26 ENCOUNTER — Other Ambulatory Visit: Payer: Self-pay | Admitting: Nurse Practitioner

## 2018-01-31 ENCOUNTER — Other Ambulatory Visit: Payer: Self-pay | Admitting: Cardiovascular Disease

## 2018-03-01 ENCOUNTER — Other Ambulatory Visit: Payer: Self-pay | Admitting: *Deleted

## 2018-03-01 MED ORDER — METOPROLOL TARTRATE 100 MG PO TABS: 100.0000 mg | ORAL_TABLET | Freq: Two times a day (BID) | ORAL | 0 refills | Status: DC

## 2018-03-01 MED ORDER — ATORVASTATIN CALCIUM 20 MG PO TABS: 20.0000 mg | ORAL_TABLET | Freq: Every day | ORAL | 0 refills | Status: DC

## 2018-03-01 MED ORDER — CHLORTHALIDONE 25 MG PO TABS: 25.0000 mg | ORAL_TABLET | Freq: Every day | ORAL | 0 refills | Status: DC | PRN

## 2018-03-01 NOTE — Telephone Encounter (Signed)
Refill Request.  

## 2018-03-04 ENCOUNTER — Emergency Department
Admission: EM | Admit: 2018-03-04 | Discharge: 2018-03-04 | Disposition: A | Discharge status: Home / Self Care | Payer: Medicare Other | Attending: Emergency Medicine | Admitting: Emergency Medicine

## 2018-03-04 ENCOUNTER — Emergency Department: Payer: Medicare Other

## 2018-03-04 ENCOUNTER — Encounter: Payer: Self-pay | Admitting: Emergency Medicine

## 2018-03-04 DIAGNOSIS — R05 Cough: Secondary | ICD-10-CM | POA: Diagnosis present

## 2018-03-04 DIAGNOSIS — J209 Acute bronchitis, unspecified: Secondary | ICD-10-CM | POA: Insufficient documentation

## 2018-03-04 DIAGNOSIS — I10 Essential (primary) hypertension: Secondary | ICD-10-CM | POA: Diagnosis not present

## 2018-03-04 DIAGNOSIS — Z7901 Long term (current) use of anticoagulants: Secondary | ICD-10-CM | POA: Diagnosis not present

## 2018-03-04 DIAGNOSIS — Z87891 Personal history of nicotine dependence: Secondary | ICD-10-CM | POA: Diagnosis not present

## 2018-03-04 DIAGNOSIS — Z79899 Other long term (current) drug therapy: Secondary | ICD-10-CM | POA: Insufficient documentation

## 2018-03-04 DIAGNOSIS — Z96651 Presence of right artificial knee joint: Secondary | ICD-10-CM | POA: Diagnosis not present

## 2018-03-04 DIAGNOSIS — J302 Other seasonal allergic rhinitis: Secondary | ICD-10-CM | POA: Diagnosis not present

## 2018-03-04 LAB — COMPREHENSIVE METABOLIC PANEL
ALT: 22 U/L (ref 17–63)
AST: 19 U/L (ref 15–41)
Albumin: 3.4 g/dL — ABNORMAL LOW (ref 3.5–5.0)
Alkaline Phosphatase: 93 U/L (ref 38–126)
Anion gap: 7 (ref 5–15)
BUN: 11 mg/dL (ref 6–20)
CO2: 30 mmol/L (ref 22–32)
Calcium: 8.9 mg/dL (ref 8.9–10.3)
Chloride: 100 mmol/L — ABNORMAL LOW (ref 101–111)
Creatinine, Ser: 0.9 mg/dL (ref 0.61–1.24)
GFR calc Af Amer: >60 mL/min (ref >60)
GFR calc non Af Amer: >60 mL/min (ref >60)
Glucose, Bld: 120 mg/dL — ABNORMAL HIGH (ref 65–99)
Potassium: 3.8 mmol/L (ref 3.5–5.1)
Sodium: 137 mmol/L (ref 135–145)
Total Bilirubin: 0.7 mg/dL (ref 0.3–1.2)
Total Protein: 8 g/dL (ref 6.5–8.1)

## 2018-03-04 LAB — URINALYSIS, COMPLETE (UACMP) WITH MICROSCOPIC
Bacteria, UA: NONE SEEN
Bilirubin Urine: NEGATIVE
Glucose, UA: NEGATIVE mg/dL
Ketones, ur: NEGATIVE mg/dL
Leukocytes, UA: NEGATIVE
Nitrite: NEGATIVE
Protein, ur: NEGATIVE mg/dL
Specific Gravity, Urine: 1.025 (ref 1.005–1.030)
Squamous Epithelial / LPF: NONE SEEN (ref 0–5)
pH: 5 (ref 5.0–8.0)

## 2018-03-04 LAB — CBC WITH DIFFERENTIAL/PLATELET
Basophils Absolute: 0.3 10*3/uL — ABNORMAL HIGH (ref 0–0.1)
Basophils Relative: 2 %
Eosinophils Absolute: 0.3 10*3/uL (ref 0–0.7)
Eosinophils Relative: 2 %
HCT: 43 % (ref 40.0–52.0)
Hemoglobin: 14.4 g/dL (ref 13.0–18.0)
Lymphocytes Relative: 12 %
Lymphs Abs: 2 10*3/uL (ref 1.0–3.6)
MCH: 31.8 pg (ref 26.0–34.0)
MCHC: 33.5 g/dL (ref 32.0–36.0)
MCV: 94.8 fL (ref 80.0–100.0)
Monocytes Absolute: 1.6 10*3/uL — ABNORMAL HIGH (ref 0.2–1.0)
Monocytes Relative: 9 %
Neutro Abs: 12.7 10*3/uL — ABNORMAL HIGH (ref 1.4–6.5)
Neutrophils Relative %: 75 %
Platelets: 237 10*3/uL (ref 150–440)
RBC: 4.53 MIL/uL (ref 4.40–5.90)
RDW: 12.7 % (ref 11.5–14.5)
WBC: 16.8 10*3/uL — ABNORMAL HIGH (ref 3.8–10.6)

## 2018-03-04 LAB — TROPONIN I: Troponin I: <0.03 ng/mL (ref <0.03)

## 2018-03-04 MED ORDER — DOXYCYCLINE HYCLATE 100 MG PO CAPS: 100.0000 mg | ORAL_CAPSULE | Freq: Two times a day (BID) | ORAL | 0 refills | Status: DC

## 2018-03-04 MED ORDER — ALBUTEROL SULFATE (2.5 MG/3ML) 0.083% IN NEBU
2.5000 mg | INHALATION_SOLUTION | Freq: Once | RESPIRATORY_TRACT | Status: AC
Start: 2018-03-04 — End: 2018-03-04
  Administered 2018-03-04: 2.5 mg via RESPIRATORY_TRACT
  Filled 2018-03-04: qty 3

## 2018-03-04 MED ORDER — BENZONATATE 100 MG PO CAPS: 200.0000 mg | ORAL_CAPSULE | Freq: Three times a day (TID) | ORAL | 0 refills | Status: DC | PRN

## 2018-03-04 MED ORDER — ALBUTEROL SULFATE HFA 108 (90 BASE) MCG/ACT IN AERS: 2.0000 | INHALATION_SPRAY | Freq: Four times a day (QID) | RESPIRATORY_TRACT | 2 refills | Status: DC | PRN
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
## 2018-03-04 NOTE — ED Notes (Signed)
Pt resting in bed, EKG and blood work obtained per order, NAD at this time.

## 2018-03-04 NOTE — ED Provider Notes (Signed)
ED ECG REPORT I, Lavonia Drafts, the attending physician, personally viewed and interpreted this ECG.  Date: 03/04/2018  Rate: 103 Rhythm: Atrial fibrillation QRS Axis: normal Intervals: Abnormal ST/T Wave abnormalities: normal Narrative Interpretation: no evidence of acute ischemia    Lavonia Drafts, MD 03/04/18 [phone]

## 2018-03-04 NOTE — Discharge Instructions (Signed)
Call make an appointment with your primary care provider for follow-up next week.  Begin taking antibiotic doxycycline 1 twice daily for the next 10 days.  Use your inhaler as needed for cough and shortness of breath.  Tessalon Perles 1 or 2 every 8 hours as needed for cough.  Return to the emergency department if any severe worsening of your symptoms or fever over 101.

## 2018-03-04 NOTE — ED Triage Notes (Signed)
Pt reports has been fighting flu like symptoms for the past 3 weeks. Reports non productive cough, fevers, general body aches. States has been using OTC meds.

## 2018-03-29 ENCOUNTER — Other Ambulatory Visit: Payer: Self-pay | Admitting: Cardiovascular Disease

## 2018-04-04 ENCOUNTER — Ambulatory Visit: Payer: Medicare Other | Admitting: Internal Medicine

## 2018-04-04 ENCOUNTER — Encounter: Payer: Self-pay | Admitting: Internal Medicine

## 2018-04-04 VITALS — BP 120/61 | HR 76 | Temp 97.6°F | Ht 71.0 in | Wt 208.4 lb

## 2018-04-04 DIAGNOSIS — I10 Essential (primary) hypertension: Secondary | ICD-10-CM

## 2018-04-04 DIAGNOSIS — I2510 Atherosclerotic heart disease of native coronary artery without angina pectoris: Secondary | ICD-10-CM | POA: Diagnosis not present

## 2018-04-04 DIAGNOSIS — J9801 Acute bronchospasm: Secondary | ICD-10-CM | POA: Diagnosis not present

## 2018-04-04 DIAGNOSIS — I4892 Unspecified atrial flutter: Secondary | ICD-10-CM

## 2018-04-04 DIAGNOSIS — Z23 Encounter for immunization: Secondary | ICD-10-CM

## 2018-04-04 DIAGNOSIS — I482 Chronic atrial fibrillation: Secondary | ICD-10-CM

## 2018-04-04 DIAGNOSIS — I700 Atherosclerosis of aorta: Secondary | ICD-10-CM

## 2018-04-04 DIAGNOSIS — K635 Polyp of colon: Secondary | ICD-10-CM

## 2018-04-04 DIAGNOSIS — I4891 Unspecified atrial fibrillation: Secondary | ICD-10-CM

## 2018-04-04 DIAGNOSIS — R7989 Other specified abnormal findings of blood chemistry: Secondary | ICD-10-CM

## 2018-04-04 DIAGNOSIS — I4821 Permanent atrial fibrillation: Secondary | ICD-10-CM

## 2018-04-04 MED ORDER — ALBUTEROL SULFATE HFA 108 (90 BASE) MCG/ACT IN AERS
1.0000 | INHALATION_SPRAY | Freq: Four times a day (QID) | RESPIRATORY_TRACT | 11 refills | Status: DC | PRN
Start: 2018-04-04 — End: 2019-06-12
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
## 2018-04-04 NOTE — Patient Instructions (Addendum)
Use albuterol inhaler 1-2 puffs every 4-6 hours as needed wheezing  Lets hold on colonoscopy until we get your heart checked out  We gave you prevnar vaccine today  Follow up in 3 weeks  Please sch fasting labs before next visit only water and medications no food as soon as possible  I will contact Dr. Rockey Situ   Pneumococcal Conjugate Vaccine suspension for injection What is this medicine? PNEUMOCOCCAL VACCINE (NEU mo KOK al vak SEEN) is a vaccine used to prevent pneumococcus bacterial infections. These bacteria can cause serious infections like pneumonia, meningitis, and blood infections. This vaccine will lower your chance of getting pneumonia. If you do get pneumonia, it can make your symptoms milder and your illness shorter. This vaccine will not treat an infection and will not cause infection. This vaccine is recommended for infants and young children, adults with certain medical conditions, and adults 90 years or older. This medicine may be used for other purposes; ask your health care provider or pharmacist if you have questions. COMMON BRAND NAME(S): Prevnar, Prevnar 13 What should I tell my health care provider before I take this medicine? They need to know if you have any of these conditions: -bleeding problems -fever -immune system problems -an unusual or allergic reaction to pneumococcal vaccine, diphtheria toxoid, other vaccines, latex, other medicines, foods, dyes, or preservatives -pregnant or trying to get pregnant -breast-feeding How should I use this medicine? This vaccine is for injection into a muscle. It is given by a health care professional. A copy of Vaccine Information Statements will be given before each vaccination. Read this sheet carefully each time. The sheet may change frequently. Talk to your pediatrician regarding the use of this medicine in children. While this drug may be prescribed for children as young as 48 weeks old for selected conditions, precautions do  apply. Overdosage: If you think you have taken too much of this medicine contact a poison control center or emergency room at once. NOTE: This medicine is only for you. Do not share this medicine with others. What if I miss a dose? It is important not to miss your dose. Call your doctor or health care professional if you are unable to keep an appointment. What may interact with this medicine? -medicines for cancer chemotherapy -medicines that suppress your immune function -steroid medicines like prednisone or cortisone This list may not describe all possible interactions. Give your health care provider a list of all the medicines, herbs, non-prescription drugs, or dietary supplements you use. Also tell them if you smoke, drink alcohol, or use illegal drugs. Some items may interact with your medicine. What should I watch for while using this medicine? Mild fever and pain should go away in 3 days or less. Report any unusual symptoms to your doctor or health care professional. What side effects may I notice from receiving this medicine? Side effects that you should report to your doctor or health care professional as soon as possible: -allergic reactions like skin rash, itching or hives, swelling of the face, lips, or tongue -breathing problems -confused -fast or irregular heartbeat -fever over 102 degrees F -seizures -unusual bleeding or bruising -unusual muscle weakness Side effects that usually do not require medical attention (report to your doctor or health care professional if they continue or are bothersome): -aches and pains -diarrhea -fever of 102 degrees F or less -headache -irritable -loss of appetite -pain, tender at site where injected -trouble sleeping This list may not describe all possible side effects. Call  your doctor for medical advice about side effects. You may report side effects to FDA at 1-800-FDA-1088. Where should I keep my medicine? This does not apply. This  vaccine is given in a clinic, pharmacy, doctor's office, or other health care setting and will not be stored at home. NOTE: This sheet is a summary. It may not cover all possible information. If you have questions about this medicine, talk to your doctor, pharmacist, or health care provider.  2018 Elsevier/Gold Standard (2014-07-16 10:27:27)  Bronchospasm, Adult Bronchospasm is a tightening of the airways going into the lungs. During an episode, it may be harder to breathe. You may cough, and you may make a whistling sound when you breathe (wheeze). This condition often affects people with asthma. What are the causes? This condition is caused by swelling and irritation in the airways. It can be triggered by:  An infection (common).  Seasonal allergies.  An allergic reaction.  Exercise.  Irritants. These include pollution, cigarette smoke, strong odors, aerosol sprays, and paint fumes.  Weather changes. Winds increase molds and pollens in the air. Cold air may cause swelling.  Stress and emotional upset.  What are the signs or symptoms? Symptoms of this condition include:  Wheezing. If the episode was triggered by an allergy, wheezing may start right away or hours later.  Nighttime coughing.  Frequent or severe coughing with a simple cold.  Chest tightness.  Shortness of breath.  Decreased ability to exercise.  How is this diagnosed? This condition is usually diagnosed with a review of your medical history and a physical exam. Tests, such as lung function tests, are sometimes done to look for other conditions. The need for a chest X-ray depends on where the wheezing occurs and whether it is the first time you have wheezed. How is this treated? This condition may be treated with:  Inhaled medicines. These open up the airways and help you breathe. They can be taken with an inhaler or a nebulizer device.  Corticosteroid medicines. These may be given for severe bronchospasm,  usually when it is associated with asthma.  Avoiding triggers, such as irritants, infection, or allergies.  Follow these instructions at home: Medicines  Take over-the-counter and prescription medicines only as told by your health care provider.  If you need to use an inhaler or nebulizer to take your medicine, ask your health care provider to explain how to use it correctly. If you were given a spacer, always use it with your inhaler. Lifestyle  Reduce the number of triggers in your home. To do this: ? Change your heating and air conditioning filter at least once a month. ? Limit your use of fireplaces and wood stoves. ? Do not smoke. Do not allow smoking in your home. ? Avoid using perfumes and fragrances. ? Get rid of pests, such as roaches and mice, and their droppings. ? Remove any mold from your home. ? Keep your house clean and dust free. Use unscented cleaning products. ? Replace carpet with wood, tile, or vinyl flooring. Carpet can trap dander and dust. ? Use allergy-proof pillows, mattress covers, and box spring covers. ? Wash bed sheets and blankets every week in hot water. Dry them in a dryer. ? Use blankets that are made of polyester or cotton. ? Wash your hands often. ? Do not allow pets in your bedroom.  Avoid breathing in cold air when you exercise. General instructions  Have a plan for seeking medical care. Know when to call your health  care provider and local emergency services, and where to get emergency care.  Stay up to date on your immunizations.  When you have an episode of bronchospasm, stay calm. Try to relax and breathe more slowly.  If you have asthma, make sure you have an asthma action plan.  Keep all follow-up visits as told by your health care provider. This is important. Contact a health care provider if:  You have muscle aches.  You have chest pain.  The mucus that you cough up (sputum) changes from clear or white to yellow, green, gray,  or bloody.  You have a fever.  Your sputum gets thicker. Get help right away if:  Your wheezing and coughing get worse, even after you take your prescribed medicines.  It gets even harder to breathe.  You develop severe chest pain. Summary  Bronchospasm is a tightening of the airways going into the lungs.  During an episode of bronchospasm, you may have a harder time breathing. You may cough and make a whistling sound when you breathe (wheeze).  Avoid exposure to triggers such as smoke, dust, mold, animal dander, and fragrances.  When you have an episode of bronchospasm, stay calm. Try to relax and breathe more slowly. This information is not intended to replace advice given to you by your health care provider. Make sure you discuss any questions you have with your health care provider. Document Released: 10/12/2003 Document Revised: 10/05/2016 Document Reviewed: 10/05/2016 Elsevier Interactive Patient Education  2017 [employer].

## 2018-04-04 NOTE — Progress Notes (Signed)
Pre visit review using our clinic review tool, if applicable. No additional management support is needed unless otherwise documented below in the visit note. 

## 2018-04-05 ENCOUNTER — Telehealth: Payer: Self-pay | Admitting: Radiology

## 2018-04-05 NOTE — Telephone Encounter (Signed)
Pt coming in for labs Monday, please place future orders. Thank you 

## 2018-04-08 ENCOUNTER — Encounter: Payer: Self-pay | Admitting: Internal Medicine

## 2018-04-08 ENCOUNTER — Other Ambulatory Visit: Payer: Self-pay | Admitting: Internal Medicine

## 2018-04-08 ENCOUNTER — Other Ambulatory Visit: Payer: Medicare Other

## 2018-04-08 DIAGNOSIS — Z13818 Encounter for screening for other digestive system disorders: Secondary | ICD-10-CM

## 2018-04-08 DIAGNOSIS — Z0184 Encounter for antibody response examination: Secondary | ICD-10-CM

## 2018-04-08 DIAGNOSIS — Z1159 Encounter for screening for other viral diseases: Secondary | ICD-10-CM

## 2018-04-08 DIAGNOSIS — R946 Abnormal results of thyroid function studies: Secondary | ICD-10-CM

## 2018-04-08 DIAGNOSIS — Z1389 Encounter for screening for other disorder: Secondary | ICD-10-CM

## 2018-04-08 DIAGNOSIS — I700 Atherosclerosis of aorta: Secondary | ICD-10-CM | POA: Insufficient documentation

## 2018-04-08 DIAGNOSIS — D72829 Elevated white blood cell count, unspecified: Secondary | ICD-10-CM

## 2018-04-08 DIAGNOSIS — R739 Hyperglycemia, unspecified: Secondary | ICD-10-CM

## 2018-04-08 DIAGNOSIS — I2510 Atherosclerotic heart disease of native coronary artery without angina pectoris: Secondary | ICD-10-CM | POA: Insufficient documentation

## 2018-04-08 DIAGNOSIS — Z125 Encounter for screening for malignant neoplasm of prostate: Secondary | ICD-10-CM

## 2018-04-08 DIAGNOSIS — I10 Essential (primary) hypertension: Secondary | ICD-10-CM
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
## 2018-04-09 ENCOUNTER — Telehealth: Payer: Self-pay

## 2018-04-09 NOTE — Telephone Encounter (Signed)
FYI

## 2018-04-09 NOTE — Telephone Encounter (Signed)
Copied from [place] [phone]. Topic: Quick Communication - See Telephone Encounter >> Apr 09, 2018  9:55 AM Hewitt Shorts wrote: Pt daughter is calling to say that Dr. Aundra Dubin had called her and she is returning her call   Best number

## 2018-04-15 ENCOUNTER — Other Ambulatory Visit: Payer: Self-pay | Admitting: Cardiovascular Disease

## 2018-04-15 ENCOUNTER — Ambulatory Visit: Payer: Medicare Other | Admitting: Physician Assistant

## 2018-04-16 NOTE — Telephone Encounter (Signed)
Lm again for daughter to return call to disc father   tMS

## 2018-04-18 ENCOUNTER — Telehealth: Payer: Self-pay | Admitting: Internal Medicine

## 2018-04-18 NOTE — Telephone Encounter (Signed)
Spoke with daughter updated per my last note 04/04/18   [place]

## 2018-04-19 NOTE — Telephone Encounter (Signed)
Spoke with daughter 04/18/18 per pt request and updated as per note   [place]

## 2018-04-21 ENCOUNTER — Other Ambulatory Visit: Payer: Self-pay | Admitting: Cardiovascular Disease

## 2018-04-26 ENCOUNTER — Telehealth: Payer: Self-pay | Admitting: Internal Medicine

## 2018-04-26 NOTE — Telephone Encounter (Signed)
Patient scheduled for fasting labs 05/02/18

## 2018-04-26 NOTE — Telephone Encounter (Signed)
Patient needs fasting lab appt asap lab orders have been in but no appt made  Fasting x 12 hours nothing but water and medications   Dr. Rockey Situ stated LDL cholesterol goal <70 if you are not having chest pain no need to do stress test for now -he may rec we increase cholesterol medication and add another cholesterol medication zetia based on what your # show   [place]

## 2018-05-02 ENCOUNTER — Other Ambulatory Visit (INDEPENDENT_AMBULATORY_CARE_PROVIDER_SITE_OTHER): Payer: Medicare Other

## 2018-05-02 ENCOUNTER — Ambulatory Visit: Payer: Medicare Other | Admitting: Physician Assistant

## 2018-05-02 DIAGNOSIS — I2510 Atherosclerotic heart disease of native coronary artery without angina pectoris: Secondary | ICD-10-CM | POA: Diagnosis not present

## 2018-05-02 DIAGNOSIS — D72829 Elevated white blood cell count, unspecified: Secondary | ICD-10-CM | POA: Diagnosis not present

## 2018-05-02 DIAGNOSIS — Z0184 Encounter for antibody response examination: Secondary | ICD-10-CM

## 2018-05-02 DIAGNOSIS — Z1159 Encounter for screening for other viral diseases: Secondary | ICD-10-CM

## 2018-05-02 DIAGNOSIS — R739 Hyperglycemia, unspecified: Secondary | ICD-10-CM

## 2018-05-02 DIAGNOSIS — Z125 Encounter for screening for malignant neoplasm of prostate: Secondary | ICD-10-CM

## 2018-05-02 DIAGNOSIS — R946 Abnormal results of thyroid function studies: Secondary | ICD-10-CM | POA: Diagnosis not present

## 2018-05-02 DIAGNOSIS — Z13818 Encounter for screening for other digestive system disorders: Secondary | ICD-10-CM

## 2018-05-02 LAB — LIPID PANEL
Cholesterol: 170 mg/dL (ref 0–200)
HDL: 53.4 mg/dL (ref >39.00)
LDL Cholesterol: 102 mg/dL — ABNORMAL HIGH (ref 0–99)
NonHDL: 116.27
Total CHOL/HDL Ratio: 3
Triglycerides: 73 mg/dL (ref 0.0–149.0)
VLDL: 14.6 mg/dL (ref 0.0–40.0)

## 2018-05-02 LAB — T4, FREE: Free T4: 0.87 ng/dL (ref 0.60–1.60)

## 2018-05-02 LAB — CBC WITH DIFFERENTIAL/PLATELET
Basophils Absolute: 0 10*3/uL (ref 0.0–0.1)
Basophils Relative: 0.6 % (ref 0.0–3.0)
Eosinophils Absolute: 0.2 10*3/uL (ref 0.0–0.7)
Eosinophils Relative: 2.6 % (ref 0.0–5.0)
HCT: 40.2 % (ref 39.0–52.0)
Hemoglobin: 13.5 g/dL (ref 13.0–17.0)
Lymphocytes Relative: 34.2 % (ref 12.0–46.0)
Lymphs Abs: 2.5 10*3/uL (ref 0.7–4.0)
MCHC: 33.6 g/dL (ref 30.0–36.0)
MCV: 94.9 fl (ref 78.0–100.0)
Monocytes Absolute: 0.6 10*3/uL (ref 0.1–1.0)
Monocytes Relative: 7.9 % (ref 3.0–12.0)
Neutro Abs: 3.9 10*3/uL (ref 1.4–7.7)
Neutrophils Relative %: 54.7 % (ref 43.0–77.0)
Platelets: 151 10*3/uL (ref 150.0–400.0)
RBC: 4.24 Mil/uL (ref 4.22–5.81)
RDW: 13.8 % (ref 11.5–15.5)
WBC: 7.2 10*3/uL (ref 4.0–10.5)

## 2018-05-02 LAB — T3, FREE: T3, Free: 3 pg/mL (ref 2.3–4.2)

## 2018-05-02 LAB — PSA: PSA: 0.56 ng/mL (ref 0.10–4.00)

## 2018-05-02 LAB — HEMOGLOBIN A1C: Hgb A1c MFr Bld: 5.8 % (ref 4.6–6.5)

## 2018-05-02 LAB — TSH: TSH: 0.65 u[IU]/mL (ref 0.35–4.50)

## 2018-05-06 LAB — HEPATITIS B SURFACE ANTIBODY, QUANTITATIVE: Hepatitis B-Post: <5 m[IU]/mL — ABNORMAL LOW (ref >=10)

## 2018-05-06 LAB — MEASLES/MUMPS/RUBELLA IMMUNITY
Mumps IgG: 266 AU/mL
Rubella: 12.3 index
Rubeola IgG: >300 AU/mL

## 2018-05-06 LAB — HCV RNA,QUANTITATIVE REAL TIME PCR
HCV Quantitative Log: <1.18 Log IU/mL
HCV RNA, PCR, QN: <15 IU/mL

## 2018-05-06 LAB — HEPATITIS B SURFACE ANTIGEN: Hepatitis B Surface Ag: NONREACTIVE

## 2018-05-06 LAB — HEPATITIS C ANTIBODY
Hepatitis C Ab: REACTIVE — AB
SIGNAL TO CUT-OFF: 1.16 — ABNORMAL HIGH (ref <1.00)

## 2018-05-08 ENCOUNTER — Telehealth: Payer: Self-pay

## 2018-05-08 ENCOUNTER — Other Ambulatory Visit: Payer: Self-pay | Admitting: Internal Medicine

## 2018-05-08 DIAGNOSIS — I2510 Atherosclerotic heart disease of native coronary artery without angina pectoris: Secondary | ICD-10-CM

## 2018-05-08 MED ORDER — ATORVASTATIN CALCIUM 40 MG PO TABS: 40.0000 mg | ORAL_TABLET | Freq: Every day | ORAL | 3 refills | Status: DC

## 2018-05-08 NOTE — Telephone Encounter (Signed)
When I went to call in cholesterol medication I noticed that patient was already on atorvastatin 20mg  . Spoke with patient he is taking atorvastatin. I just want to clarify ok to call in increased dose of atorvastatin 40 mg . Thanks

## 2018-05-08 NOTE — Telephone Encounter (Signed)
Patient is agreeable to increase to 40 mg atorvastatin .

## 2018-05-08 NOTE — Telephone Encounter (Signed)
Yes want to increase to 40 mg is he agreeable?
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
## 2018-05-13 ENCOUNTER — Encounter: Payer: Self-pay | Admitting: Cardiovascular Disease

## 2018-05-13 ENCOUNTER — Ambulatory Visit (INDEPENDENT_AMBULATORY_CARE_PROVIDER_SITE_OTHER): Payer: Medicare Other | Admitting: Cardiovascular Disease

## 2018-05-13 VITALS — BP 138/78 | HR 71 | Ht 71.0 in | Wt 211.2 lb

## 2018-05-13 DIAGNOSIS — I10 Essential (primary) hypertension: Secondary | ICD-10-CM

## 2018-05-13 DIAGNOSIS — G459 Transient cerebral ischemic attack, unspecified: Secondary | ICD-10-CM | POA: Diagnosis not present

## 2018-05-13 DIAGNOSIS — I4891 Unspecified atrial fibrillation: Secondary | ICD-10-CM

## 2018-05-13 DIAGNOSIS — I4892 Unspecified atrial flutter: Secondary | ICD-10-CM | POA: Diagnosis not present

## 2018-05-13 NOTE — Patient Instructions (Addendum)
We will schedule a cardiac cath on Monday 7/29th  No ELIQUIS on Saturday, Sunday or Monday  [facility] Cardiac Cath Instructions   You are scheduled for a Cardiac Cath on:_July 29th_  Please arrive at _08:30_am on the day of your procedure  Please expect a call from our [facility] to pre-register you  Do not eat/drink anything after midnight  Someone will need to drive you home  It is recommended someone be with you for the first 24 hours after your procedure  Wear clothes that are easy to get on/off and wear slip on shoes if possible   Medications bring a current list of all medications with you   _XX__ Do NOT take  ELIQUIS on Saturday, Sunday or Monday__  _XX__ DO NOT take Chlorthalidone the morning of your procedure.   _XX__ You may take all of your other medications the morning of your procedure with enough water to swallow safely   Day of your procedure: Arrive at the [facility] entrance.  Free valet service is available.  After entering the [facility] please check-in at the registration desk (1st desk on your right) to receive your armband. After receiving your armband someone will escort you to the cardiac cath/special procedures waiting area.  The usual length of stay after your procedure is about 2 to 3 hours.  This can vary.  If you have any questions, please call our office at [phone], or you may call the cardiac cath lab at [facility] directly at [phone]   Medication Instructions:   No medication changes made  Labwork:  We will call you with the results from your labs done today.   Follow-Up: It was a pleasure seeing you in the office today. Please call us if you have new issues that need to be addressed before your next appt.  [phone]  Your physician wants you to follow-up in: 3 weeks.  You will receive a reminder letter in the mail two months in advance. If you don't receive a letter, please call our office to schedule  the follow-up appointment.  If you need a refill on your cardiac medications before your next appointment, please call your pharmacy.  For educational health videos Log in to : [email or web address] Or : [email or web address], password : [email or web address]

## 2018-05-14 ENCOUNTER — Telehealth: Payer: Self-pay | Admitting: Cardiovascular Disease

## 2018-05-14 LAB — CBC WITH DIFFERENTIAL/PLATELET
Basophils Absolute: 0 10*3/uL (ref 0.0–0.2)
Basos: 0 %
EOS (ABSOLUTE): 0.2 10*3/uL (ref 0.0–0.4)
Eos: 3 %
Hematocrit: 41.4 % (ref 37.5–51.0)
Hemoglobin: 13.2 g/dL (ref 13.0–17.7)
Immature Grans (Abs): 0 10*3/uL (ref 0.0–0.1)
Immature Granulocytes: 0 %
Lymphocytes Absolute: 2.5 10*3/uL (ref 0.7–3.1)
Lymphs: 34 %
MCH: 30.8 pg (ref 26.6–33.0)
MCHC: 31.9 g/dL (ref 31.5–35.7)
MCV: 97 fL (ref 79–97)
Monocytes Absolute: 0.5 10*3/uL (ref 0.1–0.9)
Monocytes: 6 %
Neutrophils Absolute: 4.2 10*3/uL (ref 1.4–7.0)
Neutrophils: 57 %
Platelets: 162 10*3/uL (ref 150–450)
RBC: 4.28 x10E6/uL (ref 4.14–5.80)
RDW: 13.8 % (ref 12.3–15.4)
WBC: 7.4 10*3/uL (ref 3.4–10.8)

## 2018-05-14 LAB — BASIC METABOLIC PANEL
BUN/Creatinine Ratio: 15 (ref 10–24)
BUN: 12 mg/dL (ref 8–27)
CO2: 24 mmol/L (ref 20–29)
Calcium: 8.9 mg/dL (ref 8.6–10.2)
Chloride: 105 mmol/L (ref 96–106)
Creatinine, Ser: 0.81 mg/dL (ref 0.76–1.27)
GFR calc Af Amer: 102 mL/min/{1.73_m2} (ref >59)
GFR calc non Af Amer: 88 mL/min/{1.73_m2} (ref >59)
Glucose: 104 mg/dL — ABNORMAL HIGH (ref 65–99)
Potassium: 4.5 mmol/L (ref 3.5–5.2)
Sodium: 144 mmol/L (ref 134–144)

## 2018-05-14 NOTE — Telephone Encounter (Signed)
Spoke with patient and reviewed that time was changed for his upcoming procedure. Instructed him to arrive at the [facility] Entrance at 09:30AM instead of the 08:30AM. He verbalized understanding with no further questions at this time.

## 2018-05-18 ENCOUNTER — Other Ambulatory Visit: Payer: Self-pay | Admitting: Cardiovascular Disease

## 2018-05-18 DIAGNOSIS — I200 Unstable angina: Secondary | ICD-10-CM

## 2018-05-18 DIAGNOSIS — R0602 Shortness of breath: Secondary | ICD-10-CM

## 2018-05-20 ENCOUNTER — Other Ambulatory Visit: Payer: Self-pay | Admitting: Cardiovascular Disease

## 2018-05-20 ENCOUNTER — Encounter
Admission: RE | Disposition: A | Discharge status: Home / Self Care | Payer: Self-pay | Source: Ambulatory Visit | Attending: Cardiovascular Disease

## 2018-05-20 ENCOUNTER — Ambulatory Visit
Admission: RE | Admit: 2018-05-20 | Discharge: 2018-05-20 | Disposition: A | Discharge status: Home / Self Care | Payer: Medicare Other | Source: Ambulatory Visit | Attending: Cardiovascular Disease | Admitting: Cardiovascular Disease

## 2018-05-20 DIAGNOSIS — M1990 Unspecified osteoarthritis, unspecified site: Secondary | ICD-10-CM | POA: Diagnosis not present

## 2018-05-20 DIAGNOSIS — Z9889 Other specified postprocedural states: Secondary | ICD-10-CM | POA: Insufficient documentation

## 2018-05-20 DIAGNOSIS — Z96651 Presence of right artificial knee joint: Secondary | ICD-10-CM | POA: Diagnosis not present

## 2018-05-20 DIAGNOSIS — I4891 Unspecified atrial fibrillation: Secondary | ICD-10-CM | POA: Diagnosis present

## 2018-05-20 DIAGNOSIS — I200 Unstable angina: Secondary | ICD-10-CM

## 2018-05-20 DIAGNOSIS — I4892 Unspecified atrial flutter: Secondary | ICD-10-CM | POA: Insufficient documentation

## 2018-05-20 DIAGNOSIS — I480 Paroxysmal atrial fibrillation: Secondary | ICD-10-CM | POA: Insufficient documentation

## 2018-05-20 DIAGNOSIS — I2584 Coronary atherosclerosis due to calcified coronary lesion: Secondary | ICD-10-CM | POA: Insufficient documentation

## 2018-05-20 DIAGNOSIS — E785 Hyperlipidemia, unspecified: Secondary | ICD-10-CM | POA: Insufficient documentation

## 2018-05-20 DIAGNOSIS — R0602 Shortness of breath: Secondary | ICD-10-CM

## 2018-05-20 DIAGNOSIS — Z79899 Other long term (current) drug therapy: Secondary | ICD-10-CM | POA: Insufficient documentation

## 2018-05-20 DIAGNOSIS — Z87891 Personal history of nicotine dependence: Secondary | ICD-10-CM | POA: Insufficient documentation

## 2018-05-20 DIAGNOSIS — I10 Essential (primary) hypertension: Secondary | ICD-10-CM | POA: Insufficient documentation

## 2018-05-20 DIAGNOSIS — Z86010 Personal history of colonic polyps: Secondary | ICD-10-CM | POA: Insufficient documentation

## 2018-05-20 DIAGNOSIS — Z880 Allergy status to penicillin: Secondary | ICD-10-CM | POA: Diagnosis not present

## 2018-05-20 DIAGNOSIS — Z87828 Personal history of other (healed) physical injury and trauma: Secondary | ICD-10-CM | POA: Insufficient documentation

## 2018-05-20 DIAGNOSIS — Z8249 Family history of ischemic heart disease and other diseases of the circulatory system: Secondary | ICD-10-CM | POA: Diagnosis not present

## 2018-05-20 DIAGNOSIS — Z6829 Body mass index (BMI) 29.0-29.9, adult: Secondary | ICD-10-CM | POA: Diagnosis not present

## 2018-05-20 DIAGNOSIS — Z7901 Long term (current) use of anticoagulants: Secondary | ICD-10-CM | POA: Diagnosis not present

## 2018-05-20 DIAGNOSIS — G459 Transient cerebral ischemic attack, unspecified: Secondary | ICD-10-CM | POA: Diagnosis not present

## 2018-05-20 DIAGNOSIS — I25110 Atherosclerotic heart disease of native coronary artery with unstable angina pectoris: Secondary | ICD-10-CM | POA: Insufficient documentation

## 2018-05-20 DIAGNOSIS — K219 Gastro-esophageal reflux disease without esophagitis: Secondary | ICD-10-CM | POA: Insufficient documentation

## 2018-05-20 DIAGNOSIS — I2510 Atherosclerotic heart disease of native coronary artery without angina pectoris: Secondary | ICD-10-CM | POA: Insufficient documentation

## 2018-05-20 HISTORY — PX: LEFT HEART CATH AND CORONARY ANGIOGRAPHY: CATH118249

## 2018-05-20 SURGERY — LEFT HEART CATH AND CORONARY ANGIOGRAPHY
Anesthesia: Moderate Sedation

## 2018-05-20 SURGERY — LEFT HEART CATH AND CORONARY ANGIOGRAPHY
Anesthesia: Moderate Sedation | Laterality: Left

## 2018-05-20 MED ORDER — ASPIRIN 81 MG PO CHEW
CHEWABLE_TABLET | ORAL | Status: DC
Start: 2018-05-20 — End: 2018-05-20
  Filled 2018-05-20: qty 1

## 2018-05-20 MED ORDER — FENTANYL CITRATE (PF) 100 MCG/2ML IJ SOLN
INTRAMUSCULAR | Status: AC
  Filled 2018-05-20: qty 2

## 2018-05-20 MED ORDER — SODIUM CHLORIDE 0.9 % WEIGHT BASED INFUSION: 1.0000 mL/kg/h | INTRAVENOUS | Status: DC

## 2018-05-20 MED ORDER — IOPAMIDOL (ISOVUE-300) INJECTION 61%
INTRAVENOUS | Status: DC | PRN
  Administered 2018-05-20: 80 mL via INTRA_ARTERIAL

## 2018-05-20 MED ORDER — EZETIMIBE 10 MG PO TABS: 10.0000 mg | ORAL_TABLET | Freq: Every day | ORAL | 3 refills | Status: DC

## 2018-05-20 MED ORDER — MIDAZOLAM HCL 2 MG/2ML IJ SOLN
INTRAMUSCULAR | Status: DC | PRN
  Administered 2018-05-20 (×2): 1 mg via INTRAVENOUS

## 2018-05-20 MED ORDER — MIDAZOLAM HCL 2 MG/2ML IJ SOLN
INTRAMUSCULAR | Status: AC
  Filled 2018-05-20: qty 2

## 2018-05-20 MED ORDER — ASPIRIN 81 MG PO CHEW
81.0000 mg | CHEWABLE_TABLET | ORAL | Status: AC
  Administered 2018-05-20: 81 mg via ORAL

## 2018-05-20 MED ORDER — HEPARIN (PORCINE) IN NACL 1000-0.9 UT/500ML-% IV SOLN
INTRAVENOUS | Status: AC
  Filled 2018-05-20: qty 1000

## 2018-05-20 MED ORDER — LIDOCAINE HCL (PF) 1 % IJ SOLN
INTRAMUSCULAR | Status: AC
  Filled 2018-05-20: qty 30

## 2018-05-20 MED ORDER — FENTANYL CITRATE (PF) 100 MCG/2ML IJ SOLN
INTRAMUSCULAR | Status: DC | PRN
  Administered 2018-05-20 (×2): 25 ug via INTRAVENOUS

## 2018-05-20 MED ORDER — SODIUM CHLORIDE 0.9 % WEIGHT BASED INFUSION
3.0000 mL/kg/h | INTRAVENOUS | Status: AC
  Administered 2018-05-20: 3 mL/kg/h via INTRAVENOUS

## 2018-05-20 SURGICAL SUPPLY — 9 items
CATH INFINITI 5FR ANG PIGTAIL (CATHETERS) ×2 IMPLANT
CATH INFINITI 5FR JL4 (CATHETERS) ×2 IMPLANT
CATH INFINITI JR4 5F (CATHETERS) ×2 IMPLANT
DEVICE CLOSURE MYNXGRIP 5F (Vascular Products) ×2 IMPLANT
KIT MANI 3VAL PERCEP (MISCELLANEOUS) ×2 IMPLANT
NEEDLE PERC 18GX7CM (NEEDLE) ×2 IMPLANT
PACK CARDIAC CATH (CUSTOM PROCEDURE TRAY) ×2 IMPLANT
SHEATH AVANTI 5FR X 11CM (SHEATH) ×2 IMPLANT
WIRE GUIDERIGHT .035X150 (WIRE) ×2 IMPLANT

## 2018-05-20 NOTE — Discharge Instructions (Signed)
°Moderate Conscious Sedation, Adult, Care After °These instructions provide you with information about caring for yourself after your procedure. Your health care provider may also give you more specific instructions. Your treatment has been planned according to current medical practices, but problems sometimes occur. Call your health care provider if you have any problems or questions after your procedure. °What can I expect after the procedure? °After your procedure, it is common: °· To feel sleepy for several hours. °· To feel clumsy and have poor balance for several hours. °· To have poor judgment for several hours. °· To vomit if you eat too soon. ° °Follow these instructions at home: °For at least 24 hours after the procedure: ° °· Do not: °? Participate in activities where you could fall or become injured. °? Drive. °? Use heavy machinery. °? Drink alcohol. °? Take sleeping pills or medicines that cause drowsiness. °? Make important decisions or sign legal documents. °? Take care of children on your own. °· Rest. °Eating and drinking °· Follow the diet recommended by your health care provider. °· If you vomit: °? Drink water, juice, or soup when you can drink without vomiting. °? Make sure you have little or no nausea before eating solid foods. °General instructions °· Have a responsible adult stay with you until you are awake and alert. °· Take over-the-counter and prescription medicines only as told by your health care provider. °· If you smoke, do not smoke without supervision. °· Keep all follow-up visits as told by your health care provider. This is important. °Contact a health care provider if: °· You keep feeling nauseous or you keep vomiting. °· You feel light-headed. °· You develop a rash. °· You have a fever. °Get help right away if: °· You have trouble breathing. °This information is not intended to replace advice given to you by your health care provider. Make sure you discuss any questions you  have with your health care provider. °Document Released: 07/30/2013 Document Revised: 03/13/2016 Document Reviewed: 01/29/2016 °[employer] Interactive Patient Education © 2018 [employer] ° ° ° °Femoral Site Care °Refer to this sheet in the next few weeks. These instructions provide you with information about caring for yourself after your procedure. Your health care provider may also give you more specific instructions. Your treatment has been planned according to current medical practices, but problems sometimes occur. Call your health care provider if you have any problems or questions after your procedure. °What can I expect after the procedure? °After your procedure, it is typical to have the following: °· Bruising at the site that usually fades within 1-2 weeks. °· Blood collecting in the tissue (hematoma) that may be painful to the touch. It should usually decrease in size and tenderness within 1-2 weeks. ° °Follow these instructions at home: °· Take medicines only as directed by your health care provider. °· You may shower 24-48 hours after the procedure or as directed by your health care provider. Remove the bandage (dressing) and gently wash the site with plain soap and water. Pat the area dry with a clean towel. Do not rub the site, because this may cause bleeding. °· Do not take baths, swim, or use a hot tub until your health care provider approves. °· Check your insertion site every day for redness, swelling, or drainage. °· Do not apply powder or lotion to the site. °· Limit use of stairs to twice a day for the first 2-3 days or as directed by your health care   provider. °· Do not squat for the first 2-3 days or as directed by your health care provider. °· Do not lift over 10 lb (4.5 kg) for 5 days after your procedure or as directed by your health care provider. °· Ask your health care provider when it is okay to: °? Return to work or school. °? Resume usual physical activities or sports. °? Resume  sexual activity. °· Do not drive home if you are discharged the same day as the procedure. Have someone else drive you. °· You may drive 24 hours after the procedure unless otherwise instructed by your health care provider. °· Do not operate machinery or power tools for 24 hours after the procedure or as directed by your health care provider. °· If your procedure was done as an outpatient procedure, which means that you went home the same day as your procedure, a responsible adult should be with you for the first 24 hours after you arrive home. °· Keep all follow-up visits as directed by your health care provider. This is important. °Contact a health care provider if: °· You have a fever. °· You have chills. °· You have increased bleeding from the site. Hold pressure on the site. °Get help right away if: °· You have unusual pain at the site. °· You have redness, warmth, or swelling at the site. °· You have drainage (other than a small amount of blood on the dressing) from the site. °· The site is bleeding, and the bleeding does not stop after 30 minutes of holding steady pressure on the site. °· Your leg or foot becomes pale, cool, tingly, or numb. °This information is not intended to replace advice given to you by your health care provider. Make sure you discuss any questions you have with your health care provider. °Document Released: 06/12/2014 Document Revised: 03/16/2016 Document Reviewed: 04/28/2014 °[employer] Interactive Patient Education © 2018 [employer] ° °

## 2018-05-20 NOTE — H&P (Signed)
H&P Addendum, precardiac catheterization  Patient was seen and evaluated prior to Cardiac catheterization procedure Symptoms, prior testing details again confirmed with the patient Patient examined, no significant change from prior exam Lab work reviewed in detail personally by myself Patient understands risk and benefit of the procedure, willing to proceed  Signed, Tim Roselinda Bahena, MD, Ph.D [facility]    

## 2018-05-20 NOTE — Progress Notes (Signed)
Dr. Rockey Situ in at bedside, speaking with pt. And family re: cath results. All involved verbalize understanding.

## 2018-05-21 ENCOUNTER — Encounter: Payer: Self-pay | Admitting: Cardiovascular Disease

## 2018-05-23 ENCOUNTER — Encounter: Payer: Self-pay | Admitting: Internal Medicine

## 2018-05-23 ENCOUNTER — Ambulatory Visit: Payer: Medicare Other | Admitting: Internal Medicine

## 2018-05-23 VITALS — BP 128/68 | HR 81 | Temp 98.3°F | Ht 71.0 in | Wt 207.8 lb

## 2018-05-23 DIAGNOSIS — R319 Hematuria, unspecified: Secondary | ICD-10-CM | POA: Diagnosis not present

## 2018-05-23 DIAGNOSIS — I481 Persistent atrial fibrillation: Secondary | ICD-10-CM | POA: Diagnosis not present

## 2018-05-23 DIAGNOSIS — I10 Essential (primary) hypertension: Secondary | ICD-10-CM

## 2018-05-23 DIAGNOSIS — I4892 Unspecified atrial flutter: Secondary | ICD-10-CM

## 2018-05-23 DIAGNOSIS — E785 Hyperlipidemia, unspecified: Secondary | ICD-10-CM

## 2018-05-23 DIAGNOSIS — I4819 Other persistent atrial fibrillation: Secondary | ICD-10-CM

## 2018-05-23 LAB — URINALYSIS, ROUTINE W REFLEX MICROSCOPIC
Bilirubin Urine: NEGATIVE
Hgb urine dipstick: NEGATIVE
Ketones, ur: NEGATIVE
Leukocytes, UA: NEGATIVE
Nitrite: NEGATIVE
Specific Gravity, Urine: 1.025 (ref 1.000–1.030)
Total Protein, Urine: NEGATIVE
Urine Glucose: NEGATIVE
Urobilinogen, UA: 1 (ref 0.0–1.0)
pH: 5.5 (ref 5.0–8.0)
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
## 2018-05-23 NOTE — Patient Instructions (Addendum)
Follow up in 3-4 months fasting am appt   Cholesterol Cholesterol is a white, waxy, fat-like substance that is needed by the human body in small amounts. The liver makes all the cholesterol we need. Cholesterol is carried from the liver by the blood through the blood vessels. Deposits of cholesterol (plaques) may build up on blood vessel (artery) walls. Plaques make the arteries narrower and stiffer. Cholesterol plaques increase the risk for heart attack and stroke. You cannot feel your cholesterol level even if it is very high. The only way to know that it is high is to have a blood test. Once you know your cholesterol levels, you should keep a record of the test results. Work with your health care provider to keep your levels in the desired range. What do the results mean?  Total cholesterol is a rough measure of all the cholesterol in your blood.  LDL (low-density lipoprotein) is the "bad" cholesterol. This is the type that causes plaque to build up on the artery walls. You want this level to be low.  HDL (high-density lipoprotein) is the "good" cholesterol because it cleans the arteries and carries the LDL away. You want this level to be high.  Triglycerides are fat that the body can either burn for energy or store. High levels are closely linked to heart disease. What are the desired levels of cholesterol?  Total cholesterol below 200.  LDL below 100 for people who are at risk, below 70 for people at very high risk.  HDL above 40 is good. A level of 60 or higher is considered to be protective against heart disease.  Triglycerides below 150. How can I lower my cholesterol? Diet Follow your diet program as told by your health care provider.  Choose fish or white meat chicken and [place], roasted or baked. Limit fatty cuts of red meat, fried foods, and processed meats, such as sausage and lunch meats.  Eat lots of fresh fruits and vegetables.  Choose whole grains, beans, pasta,  potatoes, and cereals.  Choose olive oil, corn oil, or canola oil, and use only small amounts.  Avoid butter, mayonnaise, shortening, or palm kernel oils.  Avoid foods with trans fats.  Drink skim or nonfat milk and eat low-fat or nonfat yogurt and cheeses. Avoid whole milk, cream, ice cream, egg yolks, and full-fat cheeses.  Healthier desserts include angel food cake, ginger snaps, animal crackers, hard candy, popsicles, and low-fat or nonfat frozen yogurt. Avoid pastries, cakes, pies, and cookies.  Exercise  Follow your exercise program as told by your health care provider. A regular program: ? Helps to decrease LDL and raise HDL. ? Helps with weight control.  Do things that increase your activity level, such as gardening, walking, and taking the stairs.  Ask your health care provider about ways that you can be more active in your daily life.  Medicine  Take over-the-counter and prescription medicines only as told by your health care provider. ? Medicine may be prescribed by your health care provider to help lower cholesterol and decrease the risk for heart disease. This is usually done if diet and exercise have failed to bring down cholesterol levels. ? If you have several risk factors, you may need medicine even if your levels are normal.  This information is not intended to replace advice given to you by your health care provider. Make sure you discuss any questions you have with your health care provider. Document Released: 07/04/2001 Document Revised: 05/06/2016 Document Reviewed: 04/08/2016  Elsevier Interactive Patient Education  [employer].    Hematuria, Adult Hematuria is blood in your urine. It can be caused by a bladder infection, kidney infection, prostate infection, kidney stone, or cancer of your urinary tract. Infections can usually be treated with medicine, and a kidney stone usually will pass through your urine. If neither of these is the cause of your  hematuria, further workup to find out the reason may be needed. It is very important that you tell your health care provider about any blood you see in your urine, even if the blood stops without treatment or happens without causing pain. Blood in your urine that happens and then stops and then happens again can be a symptom of a very serious condition. Also, pain is not a symptom in the initial stages of many urinary cancers. Follow these instructions at home:  Drink lots of fluid, 3-4 quarts a day. If you have been diagnosed with an infection, cranberry juice is especially recommended, in addition to large amounts of water.  Avoid caffeine, tea, and carbonated beverages because they tend to irritate the bladder.  Avoid alcohol because it may irritate the prostate.  Take all medicines as directed by your health care provider.  If you were prescribed an antibiotic medicine, finish it all even if you start to feel better.  If you have been diagnosed with a kidney stone, follow your health care provider's instructions regarding straining your urine to catch the stone.  Empty your bladder often. Avoid holding urine for long periods of time.  After a bowel movement, women should cleanse front to back. Use each tissue only once.  Empty your bladder before and after sexual intercourse if you are a male. Contact a health care provider if:  You develop back pain.  You have a fever.  You have a feeling of sickness in your stomach (nausea) or vomiting.  Your symptoms are not better in 3 days. Return sooner if you are getting worse. Get help right away if:  You develop severe vomiting and are unable to keep the medicine down.  You develop severe back or abdominal pain despite taking your medicines.  You begin passing a large amount of blood or clots in your urine.  You feel extremely weak or faint, or you pass out. This information is not intended to replace advice given to you by your  health care provider. Make sure you discuss any questions you have with your health care provider. Document Released: 10/09/2005 Document Revised: 03/16/2016 Document Reviewed: 06/09/2013 Elsevier Interactive Patient Education  2017 [employer].

## 2018-05-24 ENCOUNTER — Other Ambulatory Visit: Payer: Self-pay | Admitting: Cardiovascular Disease

## 2018-05-27 ENCOUNTER — Other Ambulatory Visit: Payer: Self-pay | Admitting: Cardiovascular Disease

## 2018-05-27 NOTE — Telephone Encounter (Signed)
Refill Request.  

## 2018-05-28 ENCOUNTER — Telehealth: Payer: Self-pay | Admitting: *Deleted

## 2018-05-28 NOTE — Progress Notes (Signed)
Advise pt ok to call GI and sch colonoscopy per Dr. Rockey Situ   [place]

## 2018-05-28 NOTE — Telephone Encounter (Signed)
Dr Rockey Situ,  Any recommendations on Eliquis?

## 2018-05-28 NOTE — Telephone Encounter (Signed)
-----   Message from Minna Merritts, MD sent at 05/27/2018  9:46 PM EDT ----- Cath is done Nonobstructive disease Ok for colonoscopy thx TGollan  ----- Message ----- From: McLean-Scocuzza, Nino Glow, MD Sent: 05/23/2018   1:11 PM To: Minna Merritts, MD  After cath Dr. Rockey Situ you would be ok with colonoscopy getting scheduled, right?  [place]

## 2018-05-31 NOTE — Telephone Encounter (Signed)
Hold eliquis 2-3 days prior to colonoscopy, up to the surgeon thx TG

## 2018-06-03 NOTE — Telephone Encounter (Signed)
I left a message for the patient to call. 

## 2018-06-04 NOTE — Progress Notes (Signed)
Patient has been informed.

## 2018-06-05 NOTE — Telephone Encounter (Signed)
Patient verbalized understanding to hold Eliquis 2-3 days prior to colonoscopy.
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
## 2018-06-11 ENCOUNTER — Ambulatory Visit (INDEPENDENT_AMBULATORY_CARE_PROVIDER_SITE_OTHER): Payer: Medicare Other | Admitting: Cardiovascular Disease

## 2018-06-11 ENCOUNTER — Encounter: Payer: Self-pay | Admitting: Cardiovascular Disease

## 2018-06-11 VITALS — BP 142/84 | HR 80 | Ht 70.0 in | Wt 210.5 lb

## 2018-06-11 DIAGNOSIS — G459 Transient cerebral ischemic attack, unspecified: Secondary | ICD-10-CM

## 2018-06-11 DIAGNOSIS — F17200 Nicotine dependence, unspecified, uncomplicated: Secondary | ICD-10-CM

## 2018-06-11 DIAGNOSIS — I200 Unstable angina: Secondary | ICD-10-CM | POA: Diagnosis not present

## 2018-06-11 DIAGNOSIS — I4892 Unspecified atrial flutter: Secondary | ICD-10-CM

## 2018-06-11 DIAGNOSIS — I4891 Unspecified atrial fibrillation: Secondary | ICD-10-CM

## 2018-06-11 DIAGNOSIS — E782 Mixed hyperlipidemia: Secondary | ICD-10-CM

## 2018-06-11 DIAGNOSIS — I10 Essential (primary) hypertension: Secondary | ICD-10-CM

## 2018-06-11 MED ORDER — EZETIMIBE 10 MG PO TABS: 10.0000 mg | ORAL_TABLET | Freq: Every day | ORAL | 3 refills | Status: DC

## 2018-06-11 NOTE — Patient Instructions (Addendum)
Medication Instructions:   Please restart zetia one a day  Labwork:  No new labs needed  Testing/Procedures:  No further testing at this time  Follow-Up: It was a pleasure seeing you in the office today. Please call us if you have new issues that need to be addressed before your next appt.  [phone]  Your physician wants you to follow-up in: 12 months.  You will receive a reminder letter in the mail two months in advance. If you don't receive a letter, please call our office to schedule the follow-up appointment.  If you need a refill on your cardiac medications before your next appointment, please call your pharmacy.  For educational health videos Log in to : [email or web address] Or : [email or web address], password : [email or web address]

## 2018-06-12 NOTE — Addendum Note (Signed)
Addended by: Alba Destine on: 06/12/2018 10:48 AM   Modules accepted: Orders

## 2018-06-29 ENCOUNTER — Other Ambulatory Visit: Payer: Self-pay | Admitting: Cardiovascular Disease

## 2018-08-20 ENCOUNTER — Other Ambulatory Visit: Payer: Self-pay | Admitting: Internal Medicine

## 2018-08-20 ENCOUNTER — Telehealth: Payer: Self-pay | Admitting: Radiology

## 2018-08-20 DIAGNOSIS — I10 Essential (primary) hypertension: Secondary | ICD-10-CM

## 2018-08-20 DIAGNOSIS — E785 Hyperlipidemia, unspecified: Secondary | ICD-10-CM

## 2018-08-20 DIAGNOSIS — I700 Atherosclerosis of aorta: Secondary | ICD-10-CM

## 2018-08-20 NOTE — Telephone Encounter (Signed)
Pt coming in for labs tomorrow, please place future orders. Thank you.  

## 2018-08-21 ENCOUNTER — Other Ambulatory Visit: Payer: Medicare Other

## 2018-08-30 ENCOUNTER — Ambulatory Visit: Payer: Medicare Other | Admitting: Internal Medicine

## 2018-10-17 ENCOUNTER — Ambulatory Visit: Payer: Medicare Other | Admitting: Internal Medicine

## 2018-10-17 DIAGNOSIS — Z0289 Encounter for other administrative examinations: Secondary | ICD-10-CM

## 2018-11-25 ENCOUNTER — Other Ambulatory Visit: Payer: Self-pay | Admitting: Cardiovascular Disease

## 2018-11-25 NOTE — Telephone Encounter (Signed)
Refill Request.  

## 2019-04-20 ENCOUNTER — Other Ambulatory Visit: Payer: Self-pay | Admitting: Cardiovascular Disease

## 2019-04-21 NOTE — Telephone Encounter (Signed)
Please review for refill. Thanks!  

## 2019-04-21 NOTE — Telephone Encounter (Signed)
Pt's wt 95.5 kg, age 73, last SCr 0.81, CrCl 109.71, pt due for f/u & labs in 8/20.

## 2019-04-22 ENCOUNTER — Other Ambulatory Visit: Payer: Self-pay | Admitting: Cardiovascular Disease

## 2019-04-22 ENCOUNTER — Telehealth: Payer: Self-pay | Admitting: Internal Medicine

## 2019-04-22 ENCOUNTER — Other Ambulatory Visit: Payer: Self-pay | Admitting: Internal Medicine

## 2019-04-22 DIAGNOSIS — I2510 Atherosclerotic heart disease of native coronary artery without angina pectoris: Secondary | ICD-10-CM

## 2019-04-22 MED ORDER — ATORVASTATIN CALCIUM 40 MG PO TABS: 40.0000 mg | ORAL_TABLET | Freq: Every day | ORAL | 3 refills | Status: DC

## 2019-04-22 NOTE — Telephone Encounter (Signed)
Call pt to schedule virtual f/u appt by 05/2019   [place]

## 2019-05-26 ENCOUNTER — Other Ambulatory Visit: Payer: Self-pay

## 2019-05-26 MED ORDER — METOPROLOL TARTRATE 100 MG PO TABS: 100.0000 mg | ORAL_TABLET | Freq: Two times a day (BID) | ORAL | 0 refills | Status: DC

## 2019-05-28 ENCOUNTER — Ambulatory Visit: Payer: Medicare Other | Admitting: Internal Medicine

## 2019-06-12 ENCOUNTER — Encounter: Payer: Self-pay | Admitting: Internal Medicine

## 2019-06-12 ENCOUNTER — Other Ambulatory Visit: Payer: Self-pay

## 2019-06-12 ENCOUNTER — Ambulatory Visit (INDEPENDENT_AMBULATORY_CARE_PROVIDER_SITE_OTHER): Payer: Medicare Other | Admitting: Internal Medicine

## 2019-06-12 VITALS — Ht 71.0 in | Wt 225.0 lb

## 2019-06-12 DIAGNOSIS — I4892 Unspecified atrial flutter: Secondary | ICD-10-CM

## 2019-06-12 DIAGNOSIS — J9801 Acute bronchospasm: Secondary | ICD-10-CM

## 2019-06-12 DIAGNOSIS — I4821 Permanent atrial fibrillation: Secondary | ICD-10-CM | POA: Diagnosis not present

## 2019-06-12 DIAGNOSIS — Z1211 Encounter for screening for malignant neoplasm of colon: Secondary | ICD-10-CM

## 2019-06-12 DIAGNOSIS — I2510 Atherosclerotic heart disease of native coronary artery without angina pectoris: Secondary | ICD-10-CM

## 2019-06-12 DIAGNOSIS — R600 Localized edema: Secondary | ICD-10-CM

## 2019-06-12 DIAGNOSIS — I10 Essential (primary) hypertension: Secondary | ICD-10-CM

## 2019-06-12 DIAGNOSIS — R7989 Other specified abnormal findings of blood chemistry: Secondary | ICD-10-CM | POA: Diagnosis not present

## 2019-06-12 DIAGNOSIS — R7303 Prediabetes: Secondary | ICD-10-CM

## 2019-06-12 DIAGNOSIS — Z125 Encounter for screening for malignant neoplasm of prostate: Secondary | ICD-10-CM

## 2019-06-12 DIAGNOSIS — E559 Vitamin D deficiency, unspecified: Secondary | ICD-10-CM

## 2019-06-12 DIAGNOSIS — E782 Mixed hyperlipidemia: Secondary | ICD-10-CM | POA: Diagnosis not present

## 2019-06-12 DIAGNOSIS — Z1389 Encounter for screening for other disorder: Secondary | ICD-10-CM

## 2019-06-12 MED ORDER — ATORVASTATIN CALCIUM 40 MG PO TABS: 40.0000 mg | ORAL_TABLET | Freq: Every day | ORAL | 3 refills | Status: DC

## 2019-06-12 MED ORDER — EZETIMIBE 10 MG PO TABS: 10.0000 mg | ORAL_TABLET | Freq: Every day | ORAL | 3 refills | Status: DC

## 2019-06-12 MED ORDER — CHLORTHALIDONE 25 MG PO TABS: 12.5000 mg | ORAL_TABLET | Freq: Every day | ORAL | 3 refills | Status: DC | PRN

## 2019-06-12 MED ORDER — METOPROLOL TARTRATE 100 MG PO TABS: 100.0000 mg | ORAL_TABLET | Freq: Two times a day (BID) | ORAL | 3 refills | Status: DC

## 2019-06-12 MED ORDER — ALBUTEROL SULFATE HFA 108 (90 BASE) MCG/ACT IN AERS: 1.0000 | INHALATION_SPRAY | Freq: Four times a day (QID) | RESPIRATORY_TRACT | 11 refills | Status: DC | PRN

## 2019-06-12 NOTE — Patient Instructions (Signed)
Recombinant Zoster (Shingles) Vaccine: What You Need to Know 1. Why get vaccinated? Recombinant zoster (shingles) vaccine can prevent shingles. Shingles (also called herpes zoster, or just zoster) is a painful skin rash, usually with blisters. In addition to the rash, shingles can cause fever, headache, chills, or upset stomach. More rarely, shingles can lead to pneumonia, hearing problems, blindness, brain inflammation (encephalitis), or death. The most common complication of shingles is long-term nerve pain called postherpetic neuralgia (PHN). PHN occurs in the areas where the shingles rash was, even after the rash clears up. It can last for months or years after the rash goes away. The pain from PHN can be severe and debilitating. About 10 to 18% of people who get shingles will experience PHN. The risk of PHN increases with age. An older adult with shingles is more likely to develop PHN and have longer lasting and more severe pain than a younger person with shingles. Shingles is caused by the varicella zoster virus, the same virus that causes chickenpox. After you have chickenpox, the virus stays in your body and can cause shingles later in life. Shingles cannot be passed from one person to another, but the virus that causes shingles can spread and cause chickenpox in someone who had never had chickenpox or received chickenpox vaccine. 2. Recombinant shingles vaccine Recombinant shingles vaccine provides strong protection against shingles. By preventing shingles, recombinant shingles vaccine also protects against PHN. Recombinant shingles vaccine is the preferred vaccine for the prevention of shingles. However, a different vaccine, live shingles vaccine, may be used in some circumstances. The recombinant shingles vaccine is recommended for adults 50 years and older without serious immune problems. It is given as a two-dose series. This vaccine is also recommended for people who have already gotten  another type of shingles vaccine, the live shingles vaccine. There is no live virus in this vaccine. Shingles vaccine may be given at the same time as other vaccines. 3. Talk with your health care provider Tell your vaccine provider if the person getting the vaccine:  Has had an allergic reaction after a previous dose of recombinant shingles vaccine, or has any severe, life-threatening allergies.  Is pregnant or breastfeeding.  Is currently experiencing an episode of shingles. In some cases, your health care provider may decide to postpone shingles vaccination to a future visit. People with minor illnesses, such as a cold, may be vaccinated. People who are moderately or severely ill should usually wait until they recover before getting recombinant shingles vaccine. Your health care provider can give you more information. 4. Risks of a vaccine reaction  A sore arm with mild or moderate pain is very common after recombinant shingles vaccine, affecting about 80% of vaccinated people. Redness and swelling can also happen at the site of the injection.  Tiredness, muscle pain, headache, shivering, fever, stomach pain, and nausea happen after vaccination in more than half of people who receive recombinant shingles vaccine. In clinical trials, about 1 out of 6 people who got recombinant zoster vaccine experienced side effects that prevented them from doing regular activities. Symptoms usually went away on their own in 2 to 3 days. You should still get the second dose of recombinant zoster vaccine even if you had one of these reactions after the first dose. People sometimes faint after medical procedures, including vaccination. Tell your provider if you feel dizzy or have vision changes or ringing in the ears. As with any medicine, there is a very remote chance of a vaccine causing  a severe allergic reaction, other serious injury, or death. 5. What if there is a serious problem? An allergic reaction  could occur after the vaccinated person leaves the clinic. If you see signs of a severe allergic reaction (hives, swelling of the face and throat, difficulty breathing, a fast heartbeat, dizziness, or weakness), call 9-1-1 and get the person to the nearest hospital. For other signs that concern you, call your health care provider. Adverse reactions should be reported to the Vaccine Adverse Event Reporting System (VAERS). Your health care provider will usually file this report, or you can do it yourself. Visit the VAERS website at www.vaers.SamedayNews.es or call (478)009-4212. VAERS is only for reporting reactions, and VAERS staff do not give medical advice. 6. How can I learn more?  Ask your health care provider.  Call your local or state health department.  Contact the Centers for Disease Control and Prevention (CDC): ? Call 516-551-4976 (1-800-CDC-INFO) or ? Visit CDC's website at http://hunter.com/ Vaccine Information Statement Recombinant Zoster Vaccine (08/21/2018) This information is not intended to replace advice given to you by your health care provider. Make sure you discuss any questions you have with your health care provider. Document Released: 12/19/2016 Document Revised: 01/28/2019 Document Reviewed: 05/15/2018 Elsevier Patient Education  2020 [employer].
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
## 2019-06-20 ENCOUNTER — Other Ambulatory Visit: Payer: Self-pay | Admitting: Cardiovascular Disease

## 2019-06-20 NOTE — Telephone Encounter (Signed)
Patient said he cannot get labs at pcp office.  Please order for her at the office and call Monday per patient request .

## 2019-06-20 NOTE — Telephone Encounter (Signed)
Please review for refill. Thank you! 

## 2019-06-20 NOTE — Telephone Encounter (Signed)
Eliquis 5mg  refill request received, pt is 73yrs old, weight-102.1kg, Crea-0.81 on 05/13/18-needs updated labs, Diagnosis- Afib, and last seen by Dr. Rockey Situ on 06/11/18-needs an appt as well.   Spoke with pt and instructed pt that we need recent lab work and an appt with Dr. Rockey Situ. He is aware that we need current labwork to monitor kidney function, he has orders for labs at his PCP and part of those labs are what we needed, so pt was advised to call his PCP office and see if he could get scheduled sooner and we could use that labwork.  Pt states he has enough meds to last until he can call his PCP and see if appt can move lab appt to sooner.

## 2019-06-23 ENCOUNTER — Other Ambulatory Visit: Payer: Self-pay | Admitting: Cardiovascular Disease

## 2019-06-23 MED ORDER — APIXABAN 5 MG PO TABS: 5.0000 mg | ORAL_TABLET | Freq: Two times a day (BID) | ORAL | 2 refills | Status: DC

## 2019-06-23 NOTE — Telephone Encounter (Signed)
Attempted to call patient. LMTCB 06/23/2019  Call to patient to request him come in for OV. Pt needs labs and med refill but is past due for appt. He will need to schedule appt and we can take care of all these in one.   Routing to scheduling.

## 2019-06-23 NOTE — Telephone Encounter (Signed)
°*  STAT* If patient is at the pharmacy, call can be transferred to refill team.   1. Which medications need to be refilled? (please list name of each medication and dose if known) Eliquis 5 bid  2. Which pharmacy/location (including street and city if local pharmacy) is medication to be sent to?  [facility] [place]  3. Do they need a 30 day or 90 day supply? 90 day  Has been scheduled with Thurmond Butts on 9/10

## 2019-06-23 NOTE — Telephone Encounter (Signed)
Please review for refill. I sent in but just remembered was to send to you. Thank you!
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
## 2019-07-03 ENCOUNTER — Other Ambulatory Visit: Payer: Self-pay

## 2019-07-03 ENCOUNTER — Encounter: Payer: Self-pay | Admitting: Physician Assistant

## 2019-07-03 ENCOUNTER — Ambulatory Visit (INDEPENDENT_AMBULATORY_CARE_PROVIDER_SITE_OTHER): Payer: Medicare Other | Admitting: Physician Assistant

## 2019-07-03 VITALS — BP 142/80 | HR 84 | Ht 71.0 in | Wt 215.0 lb

## 2019-07-03 DIAGNOSIS — I4821 Permanent atrial fibrillation: Secondary | ICD-10-CM

## 2019-07-03 DIAGNOSIS — I2510 Atherosclerotic heart disease of native coronary artery without angina pectoris: Secondary | ICD-10-CM | POA: Diagnosis not present

## 2019-07-03 DIAGNOSIS — I4891 Unspecified atrial fibrillation: Secondary | ICD-10-CM

## 2019-07-03 DIAGNOSIS — I4892 Unspecified atrial flutter: Secondary | ICD-10-CM

## 2019-07-03 DIAGNOSIS — I10 Essential (primary) hypertension: Secondary | ICD-10-CM | POA: Diagnosis not present

## 2019-07-03 DIAGNOSIS — R69 Illness, unspecified: Secondary | ICD-10-CM

## 2019-07-03 DIAGNOSIS — Z87891 Personal history of nicotine dependence: Secondary | ICD-10-CM

## 2019-07-03 DIAGNOSIS — E785 Hyperlipidemia, unspecified: Secondary | ICD-10-CM

## 2019-07-03 NOTE — Patient Instructions (Signed)
Medication Instructions:  Your physician recommends that you continue on your current medications as directed. Please refer to the Current Medication list given to you today.  If you need a refill on your cardiac medications before your next appointment, please call your pharmacy.   Lab work: Your physician recommends that you return for lab work in: [place].  If you have labs (blood work) drawn today and your tests are completely normal, you will receive your results only by: [name] MyChart Message (if you have MyChart) OR . A paper copy in the mail If you have any lab test that is abnormal or we need to change your treatment, we will call you to review the results.  Testing/Procedures: NONE  Follow-Up: At [facility], you and your health needs are our priority.  As part of our continuing mission to provide you with exceptional heart care, we have created designated Provider Care Teams.  These Care Teams include your primary Cardiologist (physician) and Advanced Practice Providers (APPs -  Physician Assistants and Nurse Practitioners) who all work together to provide you with the care you need, when you need it. You will need a follow up appointment in 12 months.  Please call our office 2 months in advance to schedule this appointment.  You may see Ida Rogue, MD or one of the following Advanced Practice Providers on your designated Care Team:   Murray Hodgkins, NP Christell Faith, PA-C . Marrianne Mood, PA-C

## 2019-07-04 ENCOUNTER — Telehealth: Payer: Self-pay | Admitting: *Deleted

## 2019-07-04 DIAGNOSIS — I10 Essential (primary) hypertension: Secondary | ICD-10-CM

## 2019-07-04 DIAGNOSIS — E785 Hyperlipidemia, unspecified: Secondary | ICD-10-CM

## 2019-07-04 LAB — CBC WITH DIFFERENTIAL/PLATELET
Basophils Absolute: 0 10*3/uL (ref 0.0–0.2)
Basos: 0 %
EOS (ABSOLUTE): 0.2 10*3/uL (ref 0.0–0.4)
Eos: 3 %
Hematocrit: 46.3 % (ref 37.5–51.0)
Hemoglobin: 15.5 g/dL (ref 13.0–17.7)
Immature Grans (Abs): 0 10*3/uL (ref 0.0–0.1)
Immature Granulocytes: 0 %
Lymphocytes Absolute: 2.3 10*3/uL (ref 0.7–3.1)
Lymphs: 33 %
MCH: 31.4 pg (ref 26.6–33.0)
MCHC: 33.5 g/dL (ref 31.5–35.7)
MCV: 94 fL (ref 79–97)
Monocytes Absolute: 0.5 10*3/uL (ref 0.1–0.9)
Monocytes: 7 %
Neutrophils Absolute: 4.1 10*3/uL (ref 1.4–7.0)
Neutrophils: 57 %
Platelets: 170 10*3/uL (ref 150–450)
RBC: 4.93 x10E6/uL (ref 4.14–5.80)
RDW: 12 % (ref 11.6–15.4)
WBC: 7.1 10*3/uL (ref 3.4–10.8)

## 2019-07-04 LAB — COMPREHENSIVE METABOLIC PANEL
ALT: 23 IU/L (ref 0–44)
AST: 22 IU/L (ref 0–40)
Albumin/Globulin Ratio: 1.4 (ref 1.2–2.2)
Albumin: 4.2 g/dL (ref 3.7–4.7)
Alkaline Phosphatase: 115 IU/L (ref 39–117)
BUN/Creatinine Ratio: 18 (ref 10–24)
BUN: 17 mg/dL (ref 8–27)
Bilirubin Total: 0.8 mg/dL (ref 0.0–1.2)
CO2: 28 mmol/L (ref 20–29)
Calcium: 9.9 mg/dL (ref 8.6–10.2)
Chloride: 103 mmol/L (ref 96–106)
Creatinine, Ser: 0.95 mg/dL (ref 0.76–1.27)
GFR calc Af Amer: 91 mL/min/{1.73_m2} (ref >59)
GFR calc non Af Amer: 79 mL/min/{1.73_m2} (ref >59)
Globulin, Total: 3.1 g/dL (ref 1.5–4.5)
Glucose: 113 mg/dL — ABNORMAL HIGH (ref 65–99)
Potassium: 5 mmol/L (ref 3.5–5.2)
Sodium: 142 mmol/L (ref 134–144)
Total Protein: 7.3 g/dL (ref 6.0–8.5)

## 2019-07-04 LAB — HEMOGLOBIN A1C
Est. average glucose Bld gHb Est-mCnc: 117 mg/dL
Hgb A1c MFr Bld: 5.7 % — ABNORMAL HIGH (ref 4.8–5.6)

## 2019-07-04 LAB — LIPID PANEL
Chol/HDL Ratio: 3.6 ratio (ref 0.0–5.0)
Cholesterol, Total: 182 mg/dL (ref 100–199)
HDL: 51 mg/dL (ref >39)
LDL Chol Calc (NIH): 113 mg/dL — ABNORMAL HIGH (ref 0–99)
Triglycerides: 101 mg/dL (ref 0–149)
VLDL Cholesterol Cal: 18 mg/dL (ref 5–40)

## 2019-07-04 LAB — VITAMIN D 25 HYDROXY (VIT D DEFICIENCY, FRACTURES): Vit D, 25-Hydroxy: 33.6 ng/mL (ref 30.0–100.0)

## 2019-07-04 LAB — TSH: TSH: 0.748 u[IU]/mL (ref 0.450–4.500)

## 2019-07-04 LAB — MAGNESIUM: Magnesium: 2.2 mg/dL (ref 1.6–2.3)

## 2019-07-04 LAB — PSA: Prostate Specific Ag, Serum: 0.5 ng/mL (ref 0.0–4.0)

## 2019-07-04 MED ORDER — ATORVASTATIN CALCIUM 80 MG PO TABS: 80.0000 mg | ORAL_TABLET | Freq: Every day | ORAL | 3 refills | Status: DC

## 2019-07-04 NOTE — Telephone Encounter (Signed)
-----   Message from Rise Mu, PA-C sent at 07/04/2019  8:13 AM EDT ----- Thyroid function normal. Magnesium at goal. A1c in prediabetic range. Renal function normal. Potassium high normal. Liver function normal. Blood count normal. LDL remains above goal.  -Please increase atorvastatin to 80 mg daily -Continue Zetia -Recommend follow-up fasting lipid panel and liver function in 8 weeks  We will route labs to patient's PCP including noncardiac labs as an FYI.

## 2019-07-04 NOTE — Telephone Encounter (Signed)
Results called to pt. Pt verbalized understanding of results and recommendations. Patient aware to increase Lipitor to 80 mg daily and continue all other medications. Rx sent to pharmacy. He is aware he will need follow fasting labs in 8 weeks around November 8th and can go to the [facility] for these.  Advised him to f/u with PCP for any further recommendations from her.

## 2019-07-08 ENCOUNTER — Other Ambulatory Visit: Payer: Medicare Other

## 2019-07-08 ENCOUNTER — Telehealth: Payer: Self-pay | Admitting: Internal Medicine

## 2019-07-08 ENCOUNTER — Other Ambulatory Visit: Payer: Self-pay

## 2019-07-08 ENCOUNTER — Ambulatory Visit: Payer: Medicare Other

## 2019-07-08 NOTE — Telephone Encounter (Signed)
FYI

## 2019-07-08 NOTE — Telephone Encounter (Signed)
We need a urine and that's it  Have pt come back for urine sample only   [place]

## 2019-07-08 NOTE — Telephone Encounter (Signed)
PT came into office, he stated that Dr. Trish Fountain office already did labs and did not need these labs for today, (07/08/2019)

## 2019-07-09 NOTE — Telephone Encounter (Signed)
Patient has been scheduled a lab appt for a urine sample only on 07/14/19 @ 11:30 am.

## 2019-07-14 ENCOUNTER — Other Ambulatory Visit: Payer: Medicare Other

## 2019-07-14 ENCOUNTER — Encounter: Payer: Self-pay | Admitting: Internal Medicine

## 2019-07-17 ENCOUNTER — Other Ambulatory Visit: Payer: Self-pay

## 2019-07-17 ENCOUNTER — Other Ambulatory Visit: Payer: Medicare Other

## 2019-07-17 DIAGNOSIS — Z1389 Encounter for screening for other disorder: Secondary | ICD-10-CM

## 2019-07-18 LAB — URINALYSIS, ROUTINE W REFLEX MICROSCOPIC
Bilirubin Urine: NEGATIVE
Glucose, UA: NEGATIVE
Hgb urine dipstick: NEGATIVE
Ketones, ur: NEGATIVE
Leukocytes,Ua: NEGATIVE
Nitrite: NEGATIVE
Protein, ur: NEGATIVE
Specific Gravity, Urine: 1.019 (ref 1.001–1.03)
pH: <=5 (ref 5.0–8.0)

## 2019-08-07 ENCOUNTER — Other Ambulatory Visit: Payer: Self-pay

## 2019-08-07 ENCOUNTER — Ambulatory Visit (INDEPENDENT_AMBULATORY_CARE_PROVIDER_SITE_OTHER): Payer: Medicare Other

## 2019-08-07 DIAGNOSIS — Z23 Encounter for immunization: Secondary | ICD-10-CM

## 2019-08-08 ENCOUNTER — Ambulatory Visit (INDEPENDENT_AMBULATORY_CARE_PROVIDER_SITE_OTHER): Payer: Medicare Other

## 2019-08-08 DIAGNOSIS — Z0000 Encounter for general adult medical examination without abnormal findings: Secondary | ICD-10-CM

## 2019-08-08 DIAGNOSIS — Z1211 Encounter for screening for malignant neoplasm of colon: Secondary | ICD-10-CM | POA: Diagnosis not present

## 2019-08-08 NOTE — Addendum Note (Signed)
Addended by: Orland Mustard on: 08/08/2019 11:44 AM   Modules accepted: Orders

## 2019-08-08 NOTE — Patient Instructions (Addendum)
Carl Alvarez , Thank you for taking time to come for your Medicare Wellness Visit. I appreciate your ongoing commitment to your health goals. Please review the following plan we discussed and let me know if I can assist you in the future.   These are the goals we discussed: Goals    . Follow up with Primary Care Provider     As needed       This is a list of the screening recommended for you and due dates:  Health Maintenance  Topic Date Due  . Colon Cancer Screening  08/20/2017  . Tetanus Vaccine  04/17/2028  . Flu Shot  Completed  .  Hepatitis C: One time screening is recommended by Center for Disease Control  (CDC) for  adults born from [birth year] through 1966.   Completed  . Pneumonia vaccines  Completed    Colonoscopy, Adult A colonoscopy is an exam to look at the entire large intestine. During the exam, a lubricated, flexible tube that has a camera on the end of it is inserted into the anus and then passed into the rectum, colon, and other parts of the large intestine. You may have a colonoscopy as a part of normal colorectal screening or if you have certain symptoms, such as:  Lack of red blood cells (anemia).  Diarrhea that does not go away.  Abdominal pain.  Blood in your stool (feces). A colonoscopy can help screen for and diagnose medical problems, including:  Tumors.  Polyps.  Inflammation.  Areas of bleeding. Tell a health care provider about:  Any allergies you have.  All medicines you are taking, including vitamins, herbs, eye drops, creams, and over-the-counter medicines.  Any problems you or family members have had with anesthetic medicines.  Any blood disorders you have.  Any surgeries you have had.  Any medical conditions you have.  Any problems you have had passing stool. What are the risks? Generally, this is a safe procedure. However, problems may occur, including:  Bleeding.  A tear in the intestine.  A reaction to medicines given during  the exam.  Infection (rare). What happens before the procedure? Eating and drinking restrictions Follow instructions from your health care provider about eating and drinking, which may include:  A few days before the procedure - follow a low-fiber diet. Avoid nuts, seeds, dried fruit, raw fruits, and vegetables.  1-3 days before the procedure - follow a clear liquid diet. Drink only clear liquids, such as clear broth or bouillon, black coffee or tea, clear juice, clear soft drinks or sports drinks, gelatin dessert, and popsicles. Avoid any liquids that contain red or purple dye.  On the day of the procedure - do not eat or drink anything starting 2 hours before the procedure, or within the time period that your health care provider recommends. Up to 2 hours before the procedure, you may continue to drink clear liquids, such as water or clear fruit juice. Bowel prep If you were prescribed an oral bowel prep to clean out your colon:  Take it as told by your health care provider. Starting the day before your procedure, you will need to drink a large amount of medicated liquid. The liquid will cause you to have multiple loose stools until your stool is almost clear or light green.  If your skin or anus gets irritated from diarrhea, you may use these to relieve the irritation: ? Medicated wipes, such as adult wet wipes with aloe and vitamin E. ? A  skin-soothing product like petroleum jelly.  If you vomit while drinking the bowel prep, take a break for up to 60 minutes and then begin the bowel prep again. If vomiting continues and you cannot take the bowel prep without vomiting, call your health care provider.  To clean out your colon, you may also be given: ? Laxative medicines. ? Instructions about how to use an enema. General instructions  Ask your health care provider about: ? Changing or stopping your regular medicines or supplements. This is especially important if you are taking iron  supplements, diabetes medicines, or blood thinners. ? Taking medicines such as aspirin and ibuprofen. These medicines can thin your blood. Do not take these medicines before the procedure if your health care provider tells you not to.  Plan to have someone take you home from the hospital or clinic. What happens during the procedure?   An IV may be inserted into one of your veins.  You will be given medicine to help you relax (sedative).  To reduce your risk of infection: ? Your health care team will wash or sanitize their hands. ? Your anal area will be washed with soap.  You will be asked to lie on your side with your knees bent.  Your health care provider will lubricate a long, thin, flexible tube. The tube will have a camera and a light on the end.  The tube will be inserted into your anus.  The tube will be gently eased through your rectum and colon.  Air will be delivered into your colon to keep it open. You may feel some pressure or cramping.  The camera will be used to take images during the procedure.  A small tissue sample may be removed to be examined under a microscope (biopsy).  If small polyps are found, your health care provider may remove them and have them checked for cancer cells.  When the exam is done, the tube will be removed. The procedure may vary among health care providers and hospitals. What happens after the procedure?  Your blood pressure, heart rate, breathing rate, and blood oxygen level will be monitored until the medicines you were given have worn off.  Do not drive for 24 hours after the exam.  You may have a small amount of blood in your stool.  You may pass gas and have mild abdominal cramping or bloating due to the air that was used to inflate your colon during the exam.  It is up to you to get the results of your procedure. Ask your health care provider, or the department performing the procedure, when your results will be ready. Summary   A colonoscopy is an exam to look at the entire large intestine.  During a colonoscopy, a lubricated, flexible tube with a camera on the end of it is inserted into the anus and then passed into the colon and other parts of the large intestine.  Follow instructions from your health care provider about eating and drinking before the procedure.  If you were prescribed an oral bowel prep to clean out your colon, take it as told by your health care provider.  After your procedure, your blood pressure, heart rate, breathing rate, and blood oxygen level will be monitored until the medicines you were given have worn off. This information is not intended to replace advice given to you by your health care provider. Make sure you discuss any questions you have with your health care provider. Document Released: 10/06/2000  Document Revised: 08/01/2017 Document Reviewed: 12/21/2015 Elsevier Patient Education  2020 [employer].
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
## 2019-08-28 ENCOUNTER — Other Ambulatory Visit: Payer: Self-pay

## 2019-08-28 DIAGNOSIS — Z20822 Contact with and (suspected) exposure to covid-19: Secondary | ICD-10-CM

## 2019-08-29 LAB — NOVEL CORONAVIRUS, NAA: SARS-CoV-2, NAA: NOT DETECTED

## 2019-09-08 ENCOUNTER — Ambulatory Visit: Payer: Medicare Other

## 2019-09-12 ENCOUNTER — Other Ambulatory Visit: Payer: Self-pay

## 2019-09-13 ENCOUNTER — Other Ambulatory Visit: Payer: Self-pay | Admitting: Physician Assistant

## 2019-09-15 NOTE — Telephone Encounter (Signed)
Please review for refill. Thanks!  

## 2019-09-15 NOTE — Telephone Encounter (Signed)
Pt's age 73, wt 97.5 kg, SCr 0.95, CrCl 94.08, last ov w/ Christell Faith, PA 07/03/19.

## 2019-09-16 ENCOUNTER — Other Ambulatory Visit: Payer: Self-pay

## 2019-09-16 ENCOUNTER — Encounter: Payer: Self-pay | Admitting: Internal Medicine

## 2019-09-16 ENCOUNTER — Ambulatory Visit (INDEPENDENT_AMBULATORY_CARE_PROVIDER_SITE_OTHER): Payer: Medicare Other | Admitting: Internal Medicine

## 2019-09-16 VITALS — BP 120/70 | HR 99 | Temp 96.5°F | Ht 71.0 in | Wt 216.8 lb

## 2019-09-16 DIAGNOSIS — E782 Mixed hyperlipidemia: Secondary | ICD-10-CM | POA: Diagnosis not present

## 2019-09-16 DIAGNOSIS — I4821 Permanent atrial fibrillation: Secondary | ICD-10-CM | POA: Diagnosis not present

## 2019-09-16 DIAGNOSIS — I10 Essential (primary) hypertension: Secondary | ICD-10-CM

## 2019-09-16 DIAGNOSIS — Z0100 Encounter for examination of eyes and vision without abnormal findings: Secondary | ICD-10-CM | POA: Diagnosis not present

## 2019-09-16 DIAGNOSIS — R946 Abnormal results of thyroid function studies: Secondary | ICD-10-CM

## 2019-09-16 DIAGNOSIS — I4892 Unspecified atrial flutter: Secondary | ICD-10-CM

## 2019-09-16 DIAGNOSIS — Z23 Encounter for immunization: Secondary | ICD-10-CM | POA: Diagnosis not present

## 2019-09-16 DIAGNOSIS — R7303 Prediabetes: Secondary | ICD-10-CM
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
## 2019-09-16 NOTE — Patient Instructions (Addendum)
Schedule eye doctor appt  [facility]   Call Dr. Luis Abed [facility] when ready for colonoscopy   You can try Gas X chewables or Beano if you are eating beans    Indigestion Indigestion is a feeling of pain, discomfort, burning, or fullness in the upper part of your abdomen. It can come and go. It may occur frequently or rarely. Indigestion tends to occur while you are eating or right after you have finished eating. It may be worse at night and while bending over or lying down. Indigestion may be a symptom of an underlying digestive condition. Follow these instructions at home: Eating and drinking   Follow an eating plan as recommended by your health care provider.  Avoid certain foods and drinks as told by your health care provider. This may include: ? Chocolate and cocoa. ? Peppermint and mint flavorings. ? Garlic and onions. ? Horseradish. ? Spicy and acidic foods, including peppers, chili powder, curry powder, vinegar, hot sauces, and barbecue sauce. ? Citrus fruits, such as oranges, lemons, and limes. ? Tomato-based foods, such as red sauce, chili, salsa, and pizza with red sauce. ? Fried and fatty foods, such as donuts, french fries, potato chips, and high-fat dressings. ? High-fat meats, such as hot dogs and fatty cuts of red and white meats, such as rib eye steak, sausage, ham, and bacon. ? High-fat dairy items, such as whole milk, butter, and cream cheese. ? Coffee and tea (with or without caffeine). ? Drinks that contain alcohol. ? Energy drinks and sports drinks. ? Carbonated drinks or sodas. ? Citrus fruit juices.  Eat small, frequent meals instead of large meals.  Avoid drinking large amounts of liquid with your meals.  Avoid eating meals during the 2-3 hours before bedtime.  Avoid lying down right after you eat.  Avoid exercise for 2 hours after you eat. Lifestyle      Maintain a healthy weight. Ask your health care provider what weight is healthy for you.  If you need to lose weight, work with your health care provider to do so safely.  Exercise for at least 30 minutes on 5 or more days each week, or as told by your health care provider. Avoid exercises that include bending forward. This can make your symptoms worse.  Wear loose-fitting clothing. Do not wear anything tight around your waist that causes pressure on your abdomen.  Do not use any products that contain nicotine or tobacco, such as cigarettes, e-cigarettes, and chewing tobacco. These can make symptoms worse. If you need help quitting, ask your health care provider.  Raise (elevate) the head of your bed about 6 inches (15 cm) when you sleep.  Try to reduce your stress, such as with yoga or meditation. If you need help reducing stress, ask your health care provider. General instructions  Take over-the-counter and prescription medicines only as told by your health care provider. Do not take aspirin, ibuprofen, or other NSAIDs unless your health care provider told you to do so.  Pay attention to any changes in your symptoms.  Keep all follow-up visits as told by your health care provider. This is important. Contact a health care provider if:  You have new symptoms.  You have unexplained weight loss.  You have difficulty swallowing, or it hurts to swallow.  Your symptoms do not improve with treatment.  Your symptoms last for more than 2 days.  You have a fever.  You vomit. Get help right away if:  You have  pain in your arms, neck, jaw, teeth, or back.  You feel sweaty, dizzy, or light-headed.  You faint.  You have chest pain or shortness of breath.  You cannot stop vomiting, or you vomit blood.  Your stool is bloody or black.  You have severe pain in your abdomen. These symptoms may represent a serious problem that is an emergency. Do not wait to see if the symptoms will go away. Get medical help right away. Call your local emergency services (911 in the [place] Do  not drive yourself to the hospital. Summary  Indigestion is a feeling of pain, discomfort, burning, or fullness in the upper part of your abdomen. It tends to occur while you are eating or right after you have finished eating.  Follow an eating plan and other lifestyle changes as told by your health care provider.  Take over-the-counter and prescription medicines only as told by your health care provider. Do not take aspirin, ibuprofen, or other NSAIDs unless your health care provider told you to do so.  Contact your health care provider if your symptoms do not get better or they get worse. Some symptoms may represent a serious problem that is an emergency. Do not wait to see if the symptoms will go away. Get medical help right away. This information is not intended to replace advice given to you by your health care provider. Make sure you discuss any questions you have with your health care provider. Document Released: 11/16/2004 Document Revised: 03/11/2018 Document Reviewed: 03/11/2018 Elsevier Patient Education  2020 [employer].

## 2019-09-30 ENCOUNTER — Encounter: Payer: Self-pay | Admitting: Internal Medicine

## 2019-11-15 ENCOUNTER — Other Ambulatory Visit: Payer: Self-pay

## 2019-11-15 ENCOUNTER — Encounter: Payer: Self-pay | Admitting: Emergency Medicine

## 2019-11-15 ENCOUNTER — Emergency Department
Admission: EM | Admit: 2019-11-15 | Discharge: 2019-11-15 | Disposition: A | Discharge status: Home / Self Care | Payer: Medicare Other | Attending: Emergency Medicine | Admitting: Emergency Medicine

## 2019-11-15 ENCOUNTER — Emergency Department: Payer: Medicare Other

## 2019-11-15 DIAGNOSIS — K5100 Ulcerative (chronic) pancolitis without complications: Secondary | ICD-10-CM

## 2019-11-15 DIAGNOSIS — I10 Essential (primary) hypertension: Secondary | ICD-10-CM | POA: Diagnosis not present

## 2019-11-15 DIAGNOSIS — K5730 Diverticulosis of large intestine without perforation or abscess without bleeding: Secondary | ICD-10-CM | POA: Diagnosis not present

## 2019-11-15 DIAGNOSIS — K5190 Ulcerative colitis, unspecified, without complications: Secondary | ICD-10-CM | POA: Diagnosis not present

## 2019-11-15 DIAGNOSIS — Z8673 Personal history of transient ischemic attack (TIA), and cerebral infarction without residual deficits: Secondary | ICD-10-CM | POA: Diagnosis not present

## 2019-11-15 DIAGNOSIS — I2510 Atherosclerotic heart disease of native coronary artery without angina pectoris: Secondary | ICD-10-CM | POA: Insufficient documentation

## 2019-11-15 DIAGNOSIS — R197 Diarrhea, unspecified: Secondary | ICD-10-CM | POA: Diagnosis not present

## 2019-11-15 DIAGNOSIS — Z79899 Other long term (current) drug therapy: Secondary | ICD-10-CM | POA: Diagnosis not present

## 2019-11-15 DIAGNOSIS — A09 Infectious gastroenteritis and colitis, unspecified: Secondary | ICD-10-CM

## 2019-11-15 DIAGNOSIS — Z87891 Personal history of nicotine dependence: Secondary | ICD-10-CM | POA: Diagnosis not present

## 2019-11-15 LAB — MAGNESIUM: Magnesium: 1.8 mg/dL (ref 1.7–2.4)

## 2019-11-15 LAB — URINALYSIS, COMPLETE (UACMP) WITH MICROSCOPIC
Bacteria, UA: NONE SEEN
Bilirubin Urine: NEGATIVE
Glucose, UA: NEGATIVE mg/dL
Hgb urine dipstick: NEGATIVE
Ketones, ur: NEGATIVE mg/dL
Leukocytes,Ua: NEGATIVE
Nitrite: NEGATIVE
Protein, ur: 100 mg/dL — AB
Specific Gravity, Urine: 1.029 (ref 1.005–1.030)
Squamous Epithelial / HPF: NONE SEEN (ref 0–5)
pH: 5 (ref 5.0–8.0)

## 2019-11-15 LAB — C DIFFICILE QUICK SCREEN W PCR REFLEX
C Diff antigen: NEGATIVE
C Diff interpretation: NOT DETECTED
C Diff toxin: NEGATIVE

## 2019-11-15 LAB — COMPREHENSIVE METABOLIC PANEL
ALT: 22 U/L (ref 0–44)
AST: 20 U/L (ref 15–41)
Albumin: 4 g/dL (ref 3.5–5.0)
Alkaline Phosphatase: 92 U/L (ref 38–126)
Anion gap: 9 (ref 5–15)
BUN: 11 mg/dL (ref 8–23)
CO2: 28 mmol/L (ref 22–32)
Calcium: 9.2 mg/dL (ref 8.9–10.3)
Chloride: 106 mmol/L (ref 98–111)
Creatinine, Ser: 0.98 mg/dL (ref 0.61–1.24)
GFR calc Af Amer: >60 mL/min (ref >60)
GFR calc non Af Amer: >60 mL/min (ref >60)
Glucose, Bld: 129 mg/dL — ABNORMAL HIGH (ref 70–99)
Potassium: 3.3 mmol/L — ABNORMAL LOW (ref 3.5–5.1)
Sodium: 143 mmol/L (ref 135–145)
Total Bilirubin: 0.8 mg/dL (ref 0.3–1.2)
Total Protein: 7.8 g/dL (ref 6.5–8.1)

## 2019-11-15 LAB — LIPASE, BLOOD: Lipase: 31 U/L (ref 11–51)

## 2019-11-15 LAB — CBC
HCT: 46.5 % (ref 39.0–52.0)
Hemoglobin: 15.6 g/dL (ref 13.0–17.0)
MCH: 31.5 pg (ref 26.0–34.0)
MCHC: 33.5 g/dL (ref 30.0–36.0)
MCV: 93.9 fL (ref 80.0–100.0)
Platelets: 154 10*3/uL (ref 150–400)
RBC: 4.95 MIL/uL (ref 4.22–5.81)
RDW: 12.4 % (ref 11.5–15.5)
WBC: 13.6 10*3/uL — ABNORMAL HIGH (ref 4.0–10.5)
nRBC: 0 % (ref 0.0–0.2)

## 2019-11-15 MED ORDER — POTASSIUM CHLORIDE CRYS ER 20 MEQ PO TBCR
40.0000 meq | EXTENDED_RELEASE_TABLET | Freq: Once | ORAL | Status: AC
  Administered 2019-11-15: 12:00:00 40 meq via ORAL
  Filled 2019-11-15: qty 2

## 2019-11-15 MED ORDER — IOHEXOL 300 MG/ML  SOLN
100.0000 mL | Freq: Once | INTRAMUSCULAR | Status: AC | PRN
  Administered 2019-11-15: 100 mL via INTRAVENOUS

## 2019-11-15 MED ORDER — ACETAMINOPHEN 325 MG PO TABS
650.0000 mg | ORAL_TABLET | Freq: Once | ORAL | Status: AC
  Administered 2019-11-15: 650 mg via ORAL
  Filled 2019-11-15: qty 2

## 2019-11-15 MED ORDER — METRONIDAZOLE 500 MG PO TABS: 500.0000 mg | ORAL_TABLET | Freq: Three times a day (TID) | ORAL | 0 refills | Status: AC

## 2019-11-15 MED ORDER — LOPERAMIDE HCL 2 MG PO TABS: 2.0000 mg | ORAL_TABLET | Freq: Four times a day (QID) | ORAL | 0 refills | Status: DC | PRN

## 2019-11-15 MED ORDER — CIPROFLOXACIN HCL 250 MG PO TABS: 250.0000 mg | ORAL_TABLET | Freq: Two times a day (BID) | ORAL | 0 refills | Status: AC
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
## 2019-11-15 NOTE — ED Notes (Signed)
Renato Gails RN notified patient placed in room 1

## 2019-11-15 NOTE — Discharge Instructions (Addendum)
°  We will give antibiotics to see if that helps clear this up.  Do not take any alcohol including wine while you are on these antibiotics because they will make you throw up.  Also loperamide helps with making formed stools.  You should call your GI doctor to get follow-up.  If you are unable to get follow-up with your GI doctor you to follow-up with ours.  I discussed with Dr. Alice Reichert about your case and he agrees with the plan.  You can take 1 g of Tylenol every 8 hours to help with the pain  Return to the ER for fevers, worsening pain, any other concerns  Extensive wall thickening of the colon consistent with infectious or inflammatory near pancolitis.   Diverticulosis without diverticulitis.   Atherosclerosis.

## 2019-11-15 NOTE — ED Notes (Signed)
Pt ambulatory to the restroom without assistance. Pt stool sample sent to the lab.

## 2019-11-15 NOTE — ED Triage Notes (Signed)
Pt c/o diarrhea for past week.  Thinks ate some bad food earlier this week; started after eating cranberry sauce and chitlins.  No vomiting or fever. Some lower abdominal cramping. VSS. NAD

## 2019-11-17 ENCOUNTER — Telehealth: Payer: Self-pay

## 2019-11-17 NOTE — Telephone Encounter (Signed)
Pyrtle, Lajuan Lines, MD  Algernon Huxley, RN  Pt in ER with diarrhea and abnl CT showing colitis  C diff neg  Needs APP visit  Thanks  JMP    Pt scheduled to see Alonza Bogus PA this Friday 11/21/19@2pm . Pt aware of appt.

## 2019-11-18 ENCOUNTER — Ambulatory Visit: Payer: Medicare Other | Admitting: Internal Medicine

## 2019-11-18 LAB — GI PATHOGEN PANEL BY PCR, STOOL
Adenovirus F 40/41: NOT DETECTED
Astrovirus: NOT DETECTED
Campylobacter by PCR: DETECTED — AB
Cryptosporidium by PCR: NOT DETECTED
Cyclospora cayetanensis: NOT DETECTED
E coli (ETEC) LT/ST: NOT DETECTED
E coli (STEC): NOT DETECTED
Entamoeba histolytica: NOT DETECTED
Enteroaggregative E coli: NOT DETECTED
Enteropathogenic E coli: NOT DETECTED
G lamblia by PCR: NOT DETECTED
Norovirus GI/GII: NOT DETECTED
Plesiomonas shigelloides: NOT DETECTED
Rotavirus A by PCR: NOT DETECTED
Salmonella by PCR: NOT DETECTED
Sapovirus: NOT DETECTED
Shigella by PCR: NOT DETECTED
Vibrio cholerae: NOT DETECTED
Vibrio: NOT DETECTED
Yersinia enterocolitica: NOT DETECTED

## 2019-11-21 ENCOUNTER — Encounter: Payer: Self-pay | Admitting: Gastroenterology

## 2019-11-21 ENCOUNTER — Ambulatory Visit (INDEPENDENT_AMBULATORY_CARE_PROVIDER_SITE_OTHER): Payer: Medicare Other | Admitting: Gastroenterology

## 2019-11-21 VITALS — BP 142/88 | HR 51 | Temp 96.3°F | Ht 71.0 in | Wt 215.4 lb

## 2019-11-21 DIAGNOSIS — A045 Campylobacter enteritis: Secondary | ICD-10-CM

## 2019-11-21 NOTE — Patient Instructions (Addendum)
Stop Flagyl.   Take a daily probiotic such as Align for the next 2 weeks    Follow up in 6 weeks with Dr. Hilarie Fredrickson or Alonza Bogus, PA
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
## 2019-11-21 NOTE — Progress Notes (Signed)
Addendum: Reviewed and agree with assessment and management plan. Agree with completion of antibiotic therapy and then office followup to schedule overdue surveillance colonoscopy. Ernie Sagrero, Lajuan Lines, MD

## 2019-11-28 ENCOUNTER — Ambulatory Visit: Payer: Medicare Other | Attending: Family

## 2019-11-28 ENCOUNTER — Other Ambulatory Visit: Payer: Medicare Other

## 2019-11-28 DIAGNOSIS — Z20822 Contact with and (suspected) exposure to covid-19: Secondary | ICD-10-CM

## 2019-11-30 LAB — NOVEL CORONAVIRUS, NAA: SARS-CoV-2, NAA: DETECTED — AB

## 2019-12-01 ENCOUNTER — Telehealth: Payer: Self-pay | Admitting: Nurse Practitioner

## 2019-12-01 NOTE — Telephone Encounter (Signed)
Called to Discuss with patient about Covid symptoms and the use of bamlanivimab, a monoclonal antibody infusion for those with mild to moderate Covid symptoms and at a high risk of hospitalization.     Pt states that he is not having any symptoms.

## 2019-12-16 ENCOUNTER — Telehealth: Payer: Self-pay

## 2019-12-16 NOTE — Telephone Encounter (Signed)
-----   Message from Delorise Jackson, MD sent at 12/15/2019  2:26 PM EST ----- Regarding: FW: No Show Mail letter to pt please on the printer  [place] ----- Message ----- From: Dia Crawford, LPN Sent: [phone]  11:31 AM EST To: Nino Glow McLean-Scocuzza, MD Subject: FW: No Show                                     ----- Message ----- From: Thelma Barge, Catherin S Sent: 12/15/2019  11:01 AM EST To: Bryson Corona O'Brien-Blaney, LPN Subject: No Show

## 2019-12-16 NOTE — Telephone Encounter (Signed)
Letter mailed to address on file.

## 2019-12-31 ENCOUNTER — Other Ambulatory Visit: Payer: Medicare Other

## 2020-02-21 ENCOUNTER — Other Ambulatory Visit: Payer: Self-pay | Admitting: Cardiovascular Disease

## 2020-02-23 NOTE — Telephone Encounter (Signed)
Pt's age 74, wt 97.7 kg, SCr 0.98, CrCl 91.39, last appt w/ RD 07/03/19.

## 2020-02-23 NOTE — Telephone Encounter (Signed)
Refill request

## 2020-03-16 ENCOUNTER — Encounter: Payer: Self-pay | Admitting: Internal Medicine

## 2020-03-16 ENCOUNTER — Other Ambulatory Visit: Payer: Self-pay

## 2020-03-16 ENCOUNTER — Ambulatory Visit (INDEPENDENT_AMBULATORY_CARE_PROVIDER_SITE_OTHER): Payer: Medicare Other | Admitting: Internal Medicine

## 2020-03-16 VITALS — BP 130/70 | HR 78 | Temp 97.0°F | Ht 71.0 in | Wt 208.4 lb

## 2020-03-16 DIAGNOSIS — I10 Essential (primary) hypertension: Secondary | ICD-10-CM

## 2020-03-16 DIAGNOSIS — I4892 Unspecified atrial flutter: Secondary | ICD-10-CM

## 2020-03-16 DIAGNOSIS — Z1159 Encounter for screening for other viral diseases: Secondary | ICD-10-CM

## 2020-03-16 DIAGNOSIS — D72829 Elevated white blood cell count, unspecified: Secondary | ICD-10-CM | POA: Diagnosis not present

## 2020-03-16 DIAGNOSIS — I4891 Unspecified atrial fibrillation: Secondary | ICD-10-CM

## 2020-03-16 DIAGNOSIS — E782 Mixed hyperlipidemia: Secondary | ICD-10-CM

## 2020-03-16 DIAGNOSIS — Z0184 Encounter for antibody response examination: Secondary | ICD-10-CM | POA: Diagnosis not present

## 2020-03-16 DIAGNOSIS — R7303 Prediabetes: Secondary | ICD-10-CM | POA: Diagnosis not present

## 2020-03-16 DIAGNOSIS — Z01818 Encounter for other preprocedural examination: Secondary | ICD-10-CM

## 2020-03-16 LAB — CBC WITH DIFFERENTIAL/PLATELET
Basophils Absolute: 0 10*3/uL (ref 0.0–0.1)
Basophils Relative: 0.5 % (ref 0.0–3.0)
Eosinophils Absolute: 0.2 10*3/uL (ref 0.0–0.7)
Eosinophils Relative: 2.3 % (ref 0.0–5.0)
HCT: 42.8 % (ref 39.0–52.0)
Hemoglobin: 14.5 g/dL (ref 13.0–17.0)
Lymphocytes Relative: 24.2 % (ref 12.0–46.0)
Lymphs Abs: 2 10*3/uL (ref 0.7–4.0)
MCHC: 33.9 g/dL (ref 30.0–36.0)
MCV: 97.1 fl (ref 78.0–100.0)
Monocytes Absolute: 0.6 10*3/uL (ref 0.1–1.0)
Monocytes Relative: 6.9 % (ref 3.0–12.0)
Neutro Abs: 5.5 10*3/uL (ref 1.4–7.7)
Neutrophils Relative %: 66.1 % (ref 43.0–77.0)
Platelets: 137 10*3/uL — ABNORMAL LOW (ref 150.0–400.0)
RBC: 4.41 Mil/uL (ref 4.22–5.81)
RDW: 13.3 % (ref 11.5–15.5)
WBC: 8.3 10*3/uL (ref 4.0–10.5)

## 2020-03-16 LAB — COMPREHENSIVE METABOLIC PANEL
ALT: 25 U/L (ref 0–53)
AST: 19 U/L (ref 0–37)
Albumin: 4.1 g/dL (ref 3.5–5.2)
Alkaline Phosphatase: 105 U/L (ref 39–117)
BUN: 12 mg/dL (ref 6–23)
CO2: 32 mEq/L (ref 19–32)
Calcium: 9.4 mg/dL (ref 8.4–10.5)
Chloride: 106 mEq/L (ref 96–112)
Creatinine, Ser: 0.9 mg/dL (ref 0.40–1.50)
GFR: 99.57 mL/min (ref >60.00)
Glucose, Bld: 113 mg/dL — ABNORMAL HIGH (ref 70–99)
Potassium: 4.3 mEq/L (ref 3.5–5.1)
Sodium: 142 mEq/L (ref 135–145)
Total Bilirubin: 0.8 mg/dL (ref 0.2–1.2)
Total Protein: 6.8 g/dL (ref 6.0–8.3)

## 2020-03-16 LAB — HEMOGLOBIN A1C: Hgb A1c MFr Bld: 5.7 % (ref 4.6–6.5)

## 2020-03-16 LAB — LIPID PANEL
Cholesterol: 156 mg/dL (ref 0–200)
HDL: 47.3 mg/dL (ref >39.00)
LDL Cholesterol: 96 mg/dL (ref 0–99)
NonHDL: 108.96
Total CHOL/HDL Ratio: 3
Triglycerides: 64 mg/dL (ref 0.0–149.0)
VLDL: 12.8 mg/dL (ref 0.0–40.0)

## 2020-03-16 NOTE — Patient Instructions (Addendum)
Diltiazem Oral Tablets What is this medicine? DILTIAZEM (dil TYE a zem) is a calcium channel blocker. It relaxes your blood vessels and decreases the amount of work the heart has to do. It treats and/or prevents chest pain (also called angina). This medicine may be used for other purposes; ask your health care provider or pharmacist if you have questions. COMMON BRAND NAME(S): Cardizem What should I tell my health care provider before I take this medicine? They need to know if you have any of these conditions:  heart attack  heart disease  irregular heartbeat or rhythm  low blood pressure  an unusual or allergic reaction to diltiazem, other drugs, foods, dyes, or preservatives  pregnant or trying to get pregnant  breast-feeding How should I use this medicine? Take this drug by mouth. Take it as directed on the prescription label at the same time every day. Keep taking it unless your health care provider tells you to stop. Talk to your health care provider about the use of this drug in children. Special care may be needed. Overdosage: If you think you have taken too much of this medicine contact a poison control center or emergency room at once. NOTE: This medicine is only for you. Do not share this medicine with others. What if I miss a dose? If you miss a dose, take it as soon as you can. If it is almost time for your next dose, take only that dose. Do not take double or extra doses. What may interact with this medicine? Do not take this medicine with any of the following:  cisapride  hawthorn  pimozide  ranolazine  red yeast rice This medicine may also interact with the following medications:  buspirone  carbamazepine  cimetidine  cyclosporine  digoxin  local anesthetics or general anesthetics  lovastatin  medicines for anxiety or difficulty sleeping like midazolam and triazolam  medicines for high blood pressure or heart  problems  quinidine  rifampin, rifabutin, or rifapentine This list may not describe all possible interactions. Give your health care provider a list of all the medicines, herbs, non-prescription drugs, or dietary supplements you use. Also tell them if you smoke, drink alcohol, or use illegal drugs. Some items may interact with your medicine. What should I watch for while using this medicine? Visit your health care provider for regular checks on your progress. Check your blood pressure as directed. Ask your health care provider what your blood pressure should be. Also, find out when you should contact him or her. Do not treat yourself for coughs, colds, or pain while you are using this drug without asking your health care provider for advice. Some drugs may increase your blood pressure. This drug may cause serious skin reactions. They can happen weeks to months after starting the drug. Contact your health care provider right away if you notice fevers or flu-like symptoms with a rash. The rash may be red or purple and then turn into blisters or peeling of the skin. Or, you might notice a red rash with swelling of the face, lips or lymph nodes in your neck or under your arms. You may get drowsy or dizzy. Do not drive, use machinery, or do anything that needs mental alertness until you know how this drug affects you. Do not stand up or sit up quickly, especially if you are an older patient. This reduces the risk of dizzy or fainting spells. What side effects may I notice from receiving this medicine? Side effects that  you should report to your doctor or health care provider as soon as possible:  allergic reactions (skin rash, itching or hives; swelling of the face, lips, or tongue)  heart failure (trouble breathing; fast, irregular heartbeat; sudden weight gain; swelling of the ankles, feet, hands; unusually weak or tired)  heartbeat rhythm changes (trouble breathing; chest pain; dizziness; fast,  irregular heartbeat; feeling faint or lightheaded, falls)  liver injury (dark yellow or brown urine; general ill feeling or flu-like symptoms; loss of appetite, right upper belly pain; unusually weak or tired, yellowing of the eyes or skin)  low blood pressure (dizziness; feeling faint or lightheaded, falls; unusually weak or tired)  redness, blistering, peeling, or loosening of the skin, including inside the mouth Side effects that usually do not require medical attention (report to your doctor or health care provider if they continue or are bothersome):  changes in sex drive or performance  depressed mood  headache  sudden weight gain  nausea  sudden weight gain  trouble sleeping This list may not describe all possible side effects. Call your doctor for medical advice about side effects. You may report side effects to FDA at 1-800-FDA-1088. Where should I keep my medicine? Keep out of the reach of children and pets. Store at room temperature between 15 and 30 degrees C (59 and 86 degrees F). Protect from moisture. Keep the container tightly closed. Throw away any unused drug after the expiration date. NOTE: This sheet is a summary. It may not cover all possible information. If you have questions about this medicine, talk to your doctor, pharmacist, or health care provider.  2020 Elsevier/Gold Standard (2019-07-15 18:13:24)  Amlodipine Oral Tablets What is this medicine? AMLODIPINE (am LOE di peen) is a calcium channel blocker. It relaxes your blood vessels and decreases the amount of work the heart has to do. It treats high blood pressure and/or prevents chest pain (also called angina). This medicine may be used for other purposes; ask your health care provider or pharmacist if you have questions. COMMON BRAND NAME(S): Norvasc What should I tell my health care provider before I take this medicine? They need to know if you have any of these conditions:  heart disease  liver  disease  an unusual or allergic reaction to amlodipine, other drugs, foods, dyes, or preservatives  pregnant or trying to get pregnant  breast-feeding How should I use this medicine? Take this drug by mouth. Take it as directed on the prescription label at the same time every day. You can take it with or without food. If it upsets your stomach, take it with food. Keep taking it unless your health care provider tells you to stop. Talk to your health care provider about the use of this drug in children. While it may be prescribed for children as young as 6 for selected conditions, precautions do apply. Overdosage: If you think you have taken too much of this medicine contact a poison control center or emergency room at once. NOTE: This medicine is only for you. Do not share this medicine with others. What if I miss a dose? If you miss a dose, take it as soon as you can. If it is almost time for your next dose, take only that dose. Do not take double or extra doses. What may interact with this medicine? This medicine may interact with the following medications:  clarithromycin  cyclosporine  diltiazem  itraconazole  simvastatin  tacrolimus This list may not describe all possible interactions. Give  your health care provider a list of all the medicines, herbs, non-prescription drugs, or dietary supplements you use. Also tell them if you smoke, drink alcohol, or use illegal drugs. Some items may interact with your medicine. What should I watch for while using this medicine? Visit your health care provider for regular checks on your progress. Check your blood pressure as directed. Ask your health care provider what your blood pressure should be. Also, find out when you should contact him or her. Do not treat yourself for coughs, colds, or pain while you are using this drug without asking your health care provider for advice. Some drugs may increase your blood pressure. You may get drowsy or  dizzy. Do not drive, use machinery, or do anything that needs mental alertness until you know how this drug affects you. Do not stand up or sit up quickly, especially if you are an older patient. This reduces the risk of dizzy or fainting spells. What side effects may I notice from receiving this medicine? Side effects that you should report to your doctor or health care provider as soon as possible:  allergic reactions (skin rash, itching or hives; swelling of the face, lips, or tongue)  heart attack (trouble breathing; pain or tightness in the chest, neck, back or arms; unusually weak or tired)  low blood pressure (dizziness; feeling faint or lightheaded, falls; unusually weak or tired) Side effects that usually do not require medical attention (report these to your doctor or health care provider if they continue or are bothersome):  facial flushing  nausea  palpitations  stomach pain  sudden weight gain  swelling of the ankles, feet, hands This list may not describe all possible side effects. Call your doctor for medical advice about side effects. You may report side effects to FDA at 1-800-FDA-1088. Where should I keep my medicine? Keep out of the reach of children and pets. Store at room temperature between 59 and 86 degrees F (15 and 30 degrees C). Protect from light and moisture. Keep the container tightly closed. Throw away any unused drug after the expiration date. NOTE: This sheet is a summary. It may not cover all possible information. If you have questions about this medicine, talk to your doctor, pharmacist, or health care provider.  2020 Elsevier/Gold Standard (2019-07-15 19:39:45)  DASH Eating Plan DASH stands for "Dietary Approaches to Stop Hypertension." The DASH eating plan is a healthy eating plan that has been shown to reduce high blood pressure (hypertension). It may also reduce your risk for type 2 diabetes, heart disease, and stroke. The DASH eating plan may also  help with weight loss. What are tips for following this plan?  General guidelines  Avoid eating more than 2,300 mg (milligrams) of salt (sodium) a day. If you have hypertension, you may need to reduce your sodium intake to 1,500 mg a day.  Limit alcohol intake to no more than 1 drink a day for nonpregnant women and 2 drinks a day for men. One drink equals 12 oz of beer, 5 oz of wine, or 1 oz of hard liquor.  Work with your health care provider to maintain a healthy body weight or to lose weight. Ask what an ideal weight is for you.  Get at least 30 minutes of exercise that causes your heart to beat faster (aerobic exercise) most days of the week. Activities may include walking, swimming, or biking.  Work with your health care provider or diet and nutrition specialist (dietitian) to adjust  your eating plan to your individual calorie needs. Reading food labels   Check food labels for the amount of sodium per serving. Choose foods with less than 5 percent of the Daily Value of sodium. Generally, foods with less than 300 mg of sodium per serving fit into this eating plan.  To find whole grains, look for the word "whole" as the first word in the ingredient list. Shopping  Buy products labeled as "low-sodium" or "no salt added."  Buy fresh foods. Avoid canned foods and premade or frozen meals. Cooking  Avoid adding salt when cooking. Use salt-free seasonings or herbs instead of table salt or sea salt. Check with your health care provider or pharmacist before using salt substitutes.  Do not fry foods. Cook foods using healthy methods such as baking, boiling, grilling, and broiling instead.  Cook with heart-healthy oils, such as olive, canola, soybean, or sunflower oil. Meal planning  Eat a balanced diet that includes: ? 5 or more servings of fruits and vegetables each day. At each meal, try to fill half of your plate with fruits and vegetables. ? Up to 6-8 servings of whole grains each  day. ? Less than 6 oz of lean meat, poultry, or fish each day. A 3-oz serving of meat is about the same size as a deck of cards. One egg equals 1 oz. ? 2 servings of low-fat dairy each day. ? A serving of nuts, seeds, or beans 5 times each week. ? Heart-healthy fats. Healthy fats called Omega-3 fatty acids are found in foods such as flaxseeds and coldwater fish, like sardines, salmon, and mackerel.  Limit how much you eat of the following: ? Canned or prepackaged foods. ? Food that is high in trans fat, such as fried foods. ? Food that is high in saturated fat, such as fatty meat. ? Sweets, desserts, sugary drinks, and other foods with added sugar. ? Full-fat dairy products.  Do not salt foods before eating.  Try to eat at least 2 vegetarian meals each week.  Eat more home-cooked food and less restaurant, buffet, and fast food.  When eating at a restaurant, ask that your food be prepared with less salt or no salt, if possible. What foods are recommended? The items listed may not be a complete list. Talk with your dietitian about what dietary choices are best for you. Grains Whole-grain or whole-wheat bread. Whole-grain or whole-wheat pasta. Brown rice. [place]. Bulgur. Whole-grain and low-sodium cereals. Pita bread. Low-fat, low-sodium crackers. Whole-wheat flour tortillas. Vegetables Fresh or frozen vegetables (raw, steamed, roasted, or grilled). Low-sodium or reduced-sodium tomato and vegetable juice. Low-sodium or reduced-sodium tomato sauce and tomato paste. Low-sodium or reduced-sodium canned vegetables. Fruits All fresh, dried, or frozen fruit. Canned fruit in natural juice (without added sugar). Meat and other protein foods Skinless chicken or [place]. Ground chicken or [place]. Pork with fat trimmed off. Fish and seafood. Egg whites. Dried beans, peas, or lentils. Unsalted nuts, nut butters, and seeds. Unsalted canned beans. Lean cuts of beef with fat trimmed off.  Low-sodium, lean deli meat. Dairy Low-fat (1%) or fat-free (skim) milk. Fat-free, low-fat, or reduced-fat cheeses. Nonfat, low-sodium ricotta or cottage cheese. Low-fat or nonfat yogurt. Low-fat, low-sodium cheese. Fats and oils Soft margarine without trans fats. Vegetable oil. Low-fat, reduced-fat, or light mayonnaise and salad dressings (reduced-sodium). Canola, safflower, olive, soybean, and sunflower oils. Avocado. Seasoning and other foods Herbs. Spices. Seasoning mixes without salt. Unsalted popcorn and pretzels. Fat-free sweets. What foods are not recommended? The  items listed may not be a complete list. Talk with your dietitian about what dietary choices are best for you. Grains Baked goods made with fat, such as croissants, muffins, or some breads. Dry pasta or rice meal packs. Vegetables Creamed or fried vegetables. Vegetables in a cheese sauce. Regular canned vegetables (not low-sodium or reduced-sodium). Regular canned tomato sauce and paste (not low-sodium or reduced-sodium). Regular tomato and vegetable juice (not low-sodium or reduced-sodium). Angie Fava. Olives. Fruits Canned fruit in a light or heavy syrup. Fried fruit. Fruit in cream or butter sauce. Meat and other protein foods Fatty cuts of meat. Ribs. Fried meat. Berniece Salines. Sausage. Bologna and other processed lunch meats. Salami. Fatback. Hotdogs. Bratwurst. Salted nuts and seeds. Canned beans with added salt. Canned or smoked fish. Whole eggs or egg yolks. Chicken or [place] with skin. Dairy Whole or 2% milk, cream, and half-and-half. Whole or full-fat cream cheese. Whole-fat or sweetened yogurt. Full-fat cheese. Nondairy creamers. Whipped toppings. Processed cheese and cheese spreads. Fats and oils Butter. Stick margarine. Lard. Shortening. Ghee. Bacon fat. Tropical oils, such as coconut, palm kernel, or palm oil. Seasoning and other foods Salted popcorn and pretzels. Onion salt, garlic salt, seasoned salt, table salt, and sea  salt. Worcestershire sauce. Tartar sauce. Barbecue sauce. Teriyaki sauce. Soy sauce, including reduced-sodium. Steak sauce. Canned and packaged gravies. Fish sauce. Oyster sauce. Cocktail sauce. Horseradish that you find on the shelf. Ketchup. Mustard. Meat flavorings and tenderizers. Bouillon cubes. Hot sauce and Tabasco sauce. Premade or packaged marinades. Premade or packaged taco seasonings. Relishes. Regular salad dressings. Where to find more information:  National Heart, Lung, and [place]: https://wilson-eaton.com/  American Heart Association: www.heart.org Summary  The DASH eating plan is a healthy eating plan that has been shown to reduce high blood pressure (hypertension). It may also reduce your risk for type 2 diabetes, heart disease, and stroke.  With the DASH eating plan, you should limit salt (sodium) intake to 2,300 mg a day. If you have hypertension, you may need to reduce your sodium intake to 1,500 mg a day.  When on the DASH eating plan, aim to eat more fresh fruits and vegetables, whole grains, lean proteins, low-fat dairy, and heart-healthy fats.  Work with your health care provider or diet and nutrition specialist (dietitian) to adjust your eating plan to your individual calorie needs. This information is not intended to replace advice given to you by your health care provider. Make sure you discuss any questions you have with your health care provider. Document Revised: 09/21/2017 Document Reviewed: 10/02/2016 Elsevier Patient Education  2020 [employer].
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
## 2020-03-17 ENCOUNTER — Telehealth: Payer: Self-pay | Admitting: Internal Medicine

## 2020-03-17 LAB — HEPATITIS B SURFACE ANTIBODY, QUANTITATIVE: Hep B S AB Quant (Post): 9 m[IU]/mL — ABNORMAL LOW (ref >=10)

## 2020-03-17 NOTE — Telephone Encounter (Signed)
Pt is calling about lab results would like a call back.

## 2020-03-17 NOTE — Telephone Encounter (Signed)
Patient informed and verbalized understanding

## 2020-03-17 NOTE — Telephone Encounter (Signed)
-----   Message from Delorise Jackson, MD sent at 03/17/2020 12:35 PM EDT ----- Still has prediabetes  Liver kidneys normal  Cholesterol improved but LDL above goal goal <70 per cardiology  -[place] cardiologist wants you to schedule an appointment and ive referred you for this already  Platelets slightly low but will monitor Hep B improved immunity but still not fully protected by new vaccine   Juliann Pulse can you call hepslivAV representative and see what to do in the case?  -given another new hep B vaccine and when?

## 2020-03-18 ENCOUNTER — Ambulatory Visit: Payer: Medicare Other | Admitting: Family
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
## 2020-03-19 ENCOUNTER — Encounter: Payer: Self-pay | Admitting: Family

## 2020-03-23 ENCOUNTER — Encounter: Payer: Self-pay | Admitting: Family

## 2020-03-23 ENCOUNTER — Ambulatory Visit (INDEPENDENT_AMBULATORY_CARE_PROVIDER_SITE_OTHER): Payer: Medicare Other | Admitting: Family

## 2020-03-23 ENCOUNTER — Other Ambulatory Visit: Payer: Self-pay

## 2020-03-23 VITALS — BP 154/90 | HR 89 | Ht 71.0 in | Wt 209.4 lb

## 2020-03-23 DIAGNOSIS — E785 Hyperlipidemia, unspecified: Secondary | ICD-10-CM

## 2020-03-23 DIAGNOSIS — Z01810 Encounter for preprocedural cardiovascular examination: Secondary | ICD-10-CM

## 2020-03-23 DIAGNOSIS — I10 Essential (primary) hypertension: Secondary | ICD-10-CM | POA: Diagnosis not present

## 2020-03-23 DIAGNOSIS — I4821 Permanent atrial fibrillation: Secondary | ICD-10-CM | POA: Diagnosis not present

## 2020-03-23 DIAGNOSIS — Z7901 Long term (current) use of anticoagulants: Secondary | ICD-10-CM | POA: Diagnosis not present

## 2020-03-23 MED ORDER — DILTIAZEM HCL ER COATED BEADS 120 MG PO CP24: 120.0000 mg | ORAL_CAPSULE | Freq: Every day | ORAL | 3 refills | Status: DC

## 2020-03-23 NOTE — Patient Instructions (Signed)
Medication Instructions:  Your physician has recommended you make the following change in your medication:   START Diltiazem (Cardizem) 120 mg daily  *If you need a refill on your cardiac medications before your next appointment, please call your pharmacy*  Lab Work: No lab work today.   Testing/Procedures: Your EKG today shows rate controlled atrial fibrillation. This is a stable finding.   Follow-Up: At [facility], you and your health needs are our priority.  As part of our continuing mission to provide you with exceptional heart care, we have created designated Provider Care Teams.  These Care Teams include your primary Cardiologist (physician) and Advanced Practice Providers (APPs -  Physician Assistants and Nurse Practitioners) who all work together to provide you with the care you need, when you need it.  We recommend signing up for the patient portal called "MyChart".  Sign up information is provided on this After Visit Summary.  MyChart is used to connect with patients for Virtual Visits (Telemedicine).  Patients are able to view lab/test results, encounter notes, upcoming appointments, etc.  Non-urgent messages can be sent to your provider as well.   To learn more about what you can do with MyChart, go to [email or web address].    Your next appointment:   4 month(s)  The format for your next appointment:   In Person  Provider:   You may see Ida Rogue, MD or one of the following Advanced Practice Providers on your designated Care Team:    Murray Hodgkins, NP  Christell Faith, PA-C  Marrianne Mood, PA-C  Laurann Montana, NP  Other Instructions  Fat and Cholesterol Restricted Eating Plan Getting too much fat and cholesterol in your diet may cause health problems. Choosing the right foods helps keep your fat and cholesterol at normal levels. This can keep you from getting certain diseases. What are tips for following this plan? Meal planning  At meals, divide  your plate into four equal parts: ? Fill one-half of your plate with vegetables and green salads. ? Fill one-fourth of your plate with whole grains. ? Fill one-fourth of your plate with low-fat (lean) protein foods.  Eat fish that is high in omega-3 fats at least two times a week. This includes mackerel, tuna, sardines, and salmon.  Eat foods that are high in fiber, such as whole grains, beans, apples, broccoli, carrots, peas, and barley. General tips   Work with your doctor to lose weight if you need to.  Avoid: ? Foods with added sugar. ? Fried foods. ? Foods with partially hydrogenated oils.  Limit alcohol intake to no more than 1 drink a day for nonpregnant women and 2 drinks a day for men. One drink equals 12 oz of beer, 5 oz of wine, or 1 oz of hard liquor. Reading food labels  Check food labels for: ? Trans fats. ? Partially hydrogenated oils. ? Saturated fat (g) in each serving. ? Cholesterol (mg) in each serving. ? Fiber (g) in each serving.  Choose foods with healthy fats, such as: ? Monounsaturated fats. ? Polyunsaturated fats. ? Omega-3 fats.  Choose grain products that have whole grains. Look for the word "whole" as the first word in the ingredient list. Cooking  Cook foods using low-fat methods. These include baking, boiling, grilling, and broiling.  Eat more home-cooked foods. Eat at restaurants and buffets less often.  Avoid cooking using saturated fats, such as butter, cream, palm oil, palm kernel oil, and coconut oil. Recommended foods  Fruits  All fresh,  canned (in natural juice), or frozen fruits. Vegetables  Fresh or frozen vegetables (raw, steamed, roasted, or grilled). Green salads. Grains  Whole grains, such as whole wheat or whole grain breads, crackers, cereals, and pasta. Unsweetened oatmeal, bulgur, barley, quinoa, or brown rice. Corn or whole wheat flour tortillas. Meats and other protein foods  Ground beef (85% or leaner), grass-fed  beef, or beef trimmed of fat. Skinless chicken or [place]. Ground chicken or [place]. Pork trimmed of fat. All fish and seafood. Egg whites. Dried beans, peas, or lentils. Unsalted nuts or seeds. Unsalted canned beans. Nut butters without added sugar or oil. Dairy  Low-fat or nonfat dairy products, such as skim or 1% milk, 2% or reduced-fat cheeses, low-fat and fat-free ricotta or cottage cheese, or plain low-fat and nonfat yogurt. Fats and oils  Tub margarine without trans fats. Light or reduced-fat mayonnaise and salad dressings. Avocado. Olive, canola, sesame, or safflower oils. The items listed above may not be a complete list of foods and beverages you can eat. Contact a dietitian for more information. Foods to avoid Fruits  Canned fruit in heavy syrup. Fruit in cream or butter sauce. Fried fruit. Vegetables  Vegetables cooked in cheese, cream, or butter sauce. Fried vegetables. Grains  White bread. White pasta. White rice. Cornbread. Bagels, pastries, and croissants. Crackers and snack foods that contain trans fat and hydrogenated oils. Meats and other protein foods  Fatty cuts of meat. Ribs, chicken wings, bacon, sausage, bologna, salami, chitterlings, fatback, hot dogs, bratwurst, and packaged lunch meats. Liver and organ meats. Whole eggs and egg yolks. Chicken and [place] with skin. Fried meat. Dairy  Whole or 2% milk, cream, half-and-half, and cream cheese. Whole milk cheeses. Whole-fat or sweetened yogurt. Full-fat cheeses. Nondairy creamers and whipped toppings. Processed cheese, cheese spreads, and cheese curds. Beverages  Alcohol. Sugar-sweetened drinks such as sodas, lemonade, and fruit drinks. Fats and oils  Butter, stick margarine, lard, shortening, ghee, or bacon fat. Coconut, palm kernel, and palm oils. Sweets and desserts  Corn syrup, sugars, honey, and molasses. Candy. Jam and jelly. Syrup. Sweetened cereals. Cookies, pies, cakes, donuts, muffins, and ice cream.  The items listed above may not be a complete list of foods and beverages you should avoid. Contact a dietitian for more information. Summary  Choosing the right foods helps keep your fat and cholesterol at normal levels. This can keep you from getting certain diseases.  At meals, fill one-half of your plate with vegetables and green salads.  Eat high-fiber foods, like whole grains, beans, apples, carrots, peas, and barley.  Limit added sugar, saturated fats, alcohol, and fried foods. This information is not intended to replace advice given to you by your health care provider. Make sure you discuss any questions you have with your health care provider. Document Revised: 06/12/2018 Document Reviewed: 06/26/2017 Elsevier Patient Education  [place].   Diltiazem Extended-Release Oral Capsules or Tablets What is this medicine? DILTIAZEM (dil TYE a zem) is a calcium channel blocker. It relaxes your blood vessels and decreases the amount of work the heart has to do. It treats high blood pressure and/or prevents chest pain (also called angina). This medicine may be used for other purposes; ask your health care provider or pharmacist if you have questions. COMMON BRAND NAME(S): Cardizem CD, Cardizem LA, Cardizem SR, Cartia XT, Dilacor XR, Dilt-CD, Diltia XT, Diltzac, Matzim LA, Rema Fendt, TIADYLT ER, Tiamate, Tiazac What should I tell my health care provider before I take this medicine? They need  to know if you have any of these conditions:  heart attack  heart disease  irregular heartbeat or rhythm  low blood pressure  an unusual or allergic reaction to diltiazem, other drugs, foods, dyes, or preservatives  pregnant or trying to get pregnant  breast-feeding How should I use this medicine? Take this drug by mouth. Take it as directed on the prescription label at the same time every day. Do not cut, crush or chew this drug. Swallow the capsules whole. You can take it with or  without food. If it upsets your stomach, take it with food. Keep taking it unless your health care provider tells you to stop. Talk to your health care provider about the use of this drug in children. Special care may be needed. Overdosage: If you think you have taken too much of this medicine contact a poison control center or emergency room at once. NOTE: This medicine is only for you. Do not share this medicine with others. What if I miss a dose? If you miss a dose, take it as soon as you can. If it is almost time for your next dose, take only that dose. Do not take double or extra doses. What may interact with this medicine? Do not take this medicine with any of the following medications:  cisapride  hawthorn  pimozide  ranolazine  red yeast rice This medicine may also interact with the following medications:  buspirone  carbamazepine  cimetidine  cyclosporine  digoxin  local anesthetics or general anesthetics  lovastatin  medicines for anxiety or difficulty sleeping like midazolam and triazolam  medicines for high blood pressure or heart problems  quinidine  rifampin, rifabutin, or rifapentine This list may not describe all possible interactions. Give your health care provider a list of all the medicines, herbs, non-prescription drugs, or dietary supplements you use. Also tell them if you smoke, drink alcohol, or use illegal drugs. Some items may interact with your medicine. What should I watch for while using this medicine? Visit your health care provider for regular checks on your progress. Check your blood pressure as directed. Ask your health care provider what your blood pressure should be. Also, find out when you should contact him or her. Do not treat yourself for coughs, colds, or pain while you are using this drug without asking your health care provider for advice. Some drugs may increase your blood pressure. This drug may cause serious skin reactions. They  can happen weeks to months after starting the drug. Contact your health care provider right away if you notice fevers or flu-like symptoms with a rash. The rash may be red or purple and then turn into blisters or peeling of the skin. Or, you might notice a red rash with swelling of the face, lips or lymph nodes in your neck or under your arms. You may get drowsy or dizzy. Do not drive, use machinery, or do anything that needs mental alertness until you know how this drug affects you. Do not stand up or sit up quickly, especially if you are an older patient. This reduces the risk of dizzy or fainting spells. What side effects may I notice from receiving this medicine? Side effects that you should report to your doctor or health care provider as soon as possible:  allergic reactions (skin rash, itching or hives; swelling of the face, lips, or tongue)  heart failure (trouble breathing; fast, irregular heartbeat; sudden weight gain; swelling of the ankles, feet, hands; unusually weak or  tired)  heartbeat rhythm changes (trouble breathing; chest pain; dizziness; fast, irregular heartbeat; feeling faint or lightheaded, falls)  liver injury (dark yellow or brown urine; general ill feeling or flu-like symptoms; loss of appetite, right upper belly pain; unusually weak or tired, yellowing of the eyes or skin)  redness, blistering, peeling, or loosening of the skin, including inside the mouth Side effects that usually do not require medical attention (report to your doctor or health care provider if they continue or are bothersome):  changes in sex drive or performance  changes in vision  cough  depressed mood  headache  nasal congestion (like runny or stuffy nose)  sudden weight gain  trouble sleeping This list may not describe all possible side effects. Call your doctor for medical advice about side effects. You may report side effects to FDA at 1-800-FDA-1088. Where should I keep my medicine?  Keep out of the reach of children and pets. Store at room temperature between 20 and 25 degrees C (68 and 77 degrees F). Protect from moisture. Keep the container tightly closed. Throw away any unused drug after the expiration date. NOTE: This sheet is a summary. It may not cover all possible information. If you have questions about this medicine, talk to your doctor, pharmacist, or health care provider.  2020 Elsevier/Gold Standard (2019-07-01 14:48:13)
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
## 2020-03-24 ENCOUNTER — Ambulatory Visit (INDEPENDENT_AMBULATORY_CARE_PROVIDER_SITE_OTHER): Payer: Medicare Other

## 2020-03-24 ENCOUNTER — Other Ambulatory Visit: Payer: Self-pay

## 2020-03-24 ENCOUNTER — Telehealth: Payer: Self-pay | Admitting: Pharmacist Clinician (PhC)/ Clinical Pharmacy Specialist

## 2020-03-24 DIAGNOSIS — Z23 Encounter for immunization: Secondary | ICD-10-CM | POA: Diagnosis not present

## 2020-03-24 NOTE — Telephone Encounter (Addendum)
See office note from [facility] PA 03/23/20.  Pt seen in office for medical clearance for colonoscopy.   Patient with diagnosis of atrial fibrillation on Eliquis for anticoagulation.    Procedure: colonoscopy Date of procedure: unknown  CHADS2-VASc score of 4 (HTN, AGE, DM2, CAD) Of note, chart indicates patient had a possible TIA back in 2013, no other information available.   CrCl 96.8 Platelet count 137  Per office protocol, patient can hold Eliquis for 1 days prior to procedure.    Patient will not need bridging with Lovenox (enoxaparin) around procedure.  Patient should restart Eliquis on the evening of procedure or day after, at discretion of procedure MD

## 2020-03-24 NOTE — Progress Notes (Signed)
Patient presented for HEP B injection to left deltoid, patient voiced no concerns nor showed any signs of distress during injection.

## 2020-03-25 NOTE — Telephone Encounter (Signed)
I spoke with the patient to make him aware of our clinical pharmacist's recommendation.   Callback pool to forward this note and [employer] office note on 6/1 to Dr. Vena Rua office.   Will forward to Laurann Montana NP as Juluis Rainier

## 2020-03-25 NOTE — Telephone Encounter (Signed)
Notes have been faxed over to Dr Vena Rua office.

## 2020-04-28 ENCOUNTER — Ambulatory Visit: Payer: Medicare Other

## 2020-05-27 ENCOUNTER — Ambulatory Visit (INDEPENDENT_AMBULATORY_CARE_PROVIDER_SITE_OTHER): Payer: Medicare Other

## 2020-05-27 ENCOUNTER — Other Ambulatory Visit: Payer: Self-pay

## 2020-05-27 DIAGNOSIS — Z23 Encounter for immunization: Secondary | ICD-10-CM | POA: Diagnosis not present

## 2020-05-27 NOTE — Progress Notes (Signed)
Patient presented for Hep B injection to left deltoid, patient voiced no concerns nor showed any signs of distress during injection.

## 2020-06-04 ENCOUNTER — Encounter: Payer: Self-pay | Admitting: *Deleted

## 2020-06-22 ENCOUNTER — Other Ambulatory Visit: Payer: Self-pay

## 2020-06-22 ENCOUNTER — Telehealth: Payer: Self-pay | Admitting: Cardiovascular Disease

## 2020-06-22 DIAGNOSIS — I4821 Permanent atrial fibrillation: Secondary | ICD-10-CM

## 2020-06-22 DIAGNOSIS — I4892 Unspecified atrial flutter: Secondary | ICD-10-CM

## 2020-06-22 MED ORDER — METOPROLOL TARTRATE 100 MG PO TABS: 100.0000 mg | ORAL_TABLET | Freq: Two times a day (BID) | ORAL | 1 refills | Status: DC

## 2020-06-22 NOTE — Telephone Encounter (Signed)
metoprolol tartrate (LOPRESSOR) 100 MG tablet 180 tablet 1 06/22/2020    Sig - Route: Take 1 tablet (100 mg total) by mouth 2 (two) times daily. - Oral   Sent to pharmacy as: metoprolol tartrate (LOPRESSOR) 100 MG tablet   Notes to Pharmacy: Please call our office and schedule yearly appt for further refills   E-Prescribing Status: Receipt confirmed by pharmacy (06/22/2020  4:21 PM EDT)   Associated Diagnoses  Permanent atrial fibrillation ([place])     Atrial flutter, unspecified type [facility])     Pharmacy  [id number] [address]

## 2020-06-22 NOTE — Telephone Encounter (Signed)
°*  STAT* If patient is at the pharmacy, call can be transferred to refill team.   1. Which medications need to be refilled? (please list name of each medication and dose if known) metoprolol 100 bid  2. Which pharmacy/location (including street and city if local pharmacy) is [facility] [place] [address]  3. Do they need a 30 day or 90 day supply? [place]

## 2020-07-02 ENCOUNTER — Encounter: Payer: Self-pay | Admitting: Internal Medicine

## 2020-07-02 ENCOUNTER — Ambulatory Visit (INDEPENDENT_AMBULATORY_CARE_PROVIDER_SITE_OTHER): Payer: Medicare Other | Admitting: Internal Medicine

## 2020-07-02 ENCOUNTER — Telehealth: Payer: Self-pay | Admitting: *Deleted

## 2020-07-02 VITALS — BP 138/82 | HR 75 | Ht 71.0 in | Wt 218.0 lb

## 2020-07-02 DIAGNOSIS — Z86010 Personal history of colonic polyps: Secondary | ICD-10-CM

## 2020-07-02 DIAGNOSIS — L290 Pruritus ani: Secondary | ICD-10-CM | POA: Diagnosis not present

## 2020-07-02 DIAGNOSIS — Z7901 Long term (current) use of anticoagulants: Secondary | ICD-10-CM | POA: Diagnosis not present

## 2020-07-02 MED ORDER — SUTAB 1479-225-188 MG PO TABS: 1.0000 | ORAL_TABLET | ORAL | 0 refills | Status: DC

## 2020-07-02 NOTE — Progress Notes (Signed)
   Subjective:    Patient ID: Carl Alvarez, male    DOB: 1946/06/18, 74 y.o.   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  He is here alone today.  He was last seen in January 2021 when he was dealing with Campylobacter induced colitis.  He was treated with antibiotics and symptoms resolved.  He reports he is feeling well.  He had resolution of his diarrhea and abdominal pain associated with Campylobacter.  He feels that he got this from eating undercooked chicken.  He currently is having regular stool without blood or melena.  He does have intermittent perianal itching.  No abdominal pain.  No upper GI or hepatobiliary complaint.  He has stopped smoking.  His last colonoscopy was performed by me on 08/20/2012.  This revealed a 3 mm and 4 mm sessile polyp removed from the sigmoid and ascending colon.  There was severe left-sided diverticulosis.  The ascending colon polyp was an adenoma of the sigmoid polyp was hyperplastic.  Review of Systems As per HPI, otherwise negative  Current Medications, Allergies, Past Medical History, Past Surgical History, Family History and Social History were reviewed in [employer] record.     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   1.  History of adenomatous colon polyp --surveillance colonoscopy is recommended at this time.  His last exam was nearly 8 years ago.  We discussed the risk, benefits and alternatives and he is agreeable and wishes to proceed.  We will need to hold his Eliquis 48 hours prior to colonoscopy and we will discuss this with his prescribing provider, his cardiologist, Dr. Rockey Situ  Will hold Eliquis 2 days prior to endoscopic procedures - will instruct when and how to resume after procedure. Benefits and risks of procedure explained including risks of bleeding, perforation, infection, missed lesions, reactions to medications and possible need for hospitalization and surgery for complications. Additional rare but real risk of stroke or other vascular clotting events off Eliquis also explained and need to seek urgent help if any signs of these problems occur. Will communicate by phone or EMR with patient's  prescribing provider to confirm that holding Eliquis is reasonable in this case.   2.  Perianal itch --we will evaluate at time of colonoscopy.  This could be secondary to internal hemorrhoid.  Rule out perianal fungal infection.  Could also be related to diet and we can advise on foods to avoid depending on findings of colonoscopy.  3.  Campylobacter colitis --resolved after antibiotic therapy in January 2021.  No clinical evidence of residual colitis.

## 2020-07-02 NOTE — Telephone Encounter (Signed)
Request for surgical clearance:     Endoscopy Procedure  What type of surgery is being performed?     colonoscopy  When is this surgery scheduled?     07/07/20  What type of clearance is required ?   Pharmacy  Are there any medications that need to be held prior to surgery and how long? Eliquis, 2 days  Practice name and name of physician performing surgery?      [facility]  What is your office phone and fax number?      Phone- [phone]  [phone]  Anesthesia type (None, local, MAC, general) ?       MAC

## 2020-07-02 NOTE — Addendum Note (Signed)
Addended by: Jerene Bears on: 07/02/2020 06:36 PM   Modules accepted: Level of Service

## 2020-07-02 NOTE — Patient Instructions (Addendum)
You have been scheduled for a colonoscopy. Please follow written instructions given to you at your visit today.  Please pick up your prep supplies at the pharmacy within the next 1-3 days. If you use inhalers (even only as needed), please bring them with you on the day of your procedure.  If you are age 75 or older, your body mass index should be between 23-30. Your Body mass index is 30.4 kg/m. If this is out of the aforementioned range listed, please consider follow up with your Primary Care Provider.  If you are age 57 or younger, your body mass index should be between 19-25. Your Body mass index is 30.4 kg/m. If this is out of the aformentioned range listed, please consider follow up with your Primary Care Provider.   You will be contacted by our office prior to your procedure for directions on holding your Eliquis.  If you do not hear from our office 1 week prior to your scheduled procedure, please call [phone] to discuss.   Due to recent changes in healthcare laws, you may see the results of your imaging and laboratory studies on MyChart before your provider has had a chance to review them.  We understand that in some cases there may be results that are confusing or concerning to you. Not all laboratory results come back in the same time frame and the provider may be waiting for multiple results in order to interpret others.  Please give [place] 48 hours in order for your provider to thoroughly review all the results before contacting the office for clarification of your results.

## 2020-07-05 ENCOUNTER — Telehealth: Payer: Self-pay | Admitting: Internal Medicine

## 2020-07-05 ENCOUNTER — Other Ambulatory Visit: Payer: Self-pay | Admitting: Internal Medicine

## 2020-07-05 NOTE — Telephone Encounter (Signed)
I have spoken to patients pharmacy to advise to run code for [place] as noted on script we sent. Pharmacist says he now sees code and is running coupon. Patient advised and states he plans to pick prep up tomorrow morning.

## 2020-07-05 NOTE — Telephone Encounter (Signed)
Patient has been advised that per Dr Donivan Scull office, he may hold Eliquis 2 days prior his upcoming procedure. Patient verbalizes understanding of this.

## 2020-07-05 NOTE — Telephone Encounter (Signed)
Clinical pharmacist to review Eliquis. Procedure is in 2 days

## 2020-07-05 NOTE — Telephone Encounter (Signed)
Patient called states the prep medication is too expensive please send alternate

## 2020-07-05 NOTE — Telephone Encounter (Signed)
Patient with diagnosis of afib on Eliquis for anticoagulation.    Procedure: colonoscopy Date of procedure: 07/07/20  CHADS2-VASc score of  4 (HTN, AGE, CAD, AGE)  Patient had a questionable TIA in 2013, however was asked by Dr. Rockey Situ to hold Eliquis 2 days prior to a cath in 2019  CrCl 84 ml/min  Per office protocol, patient can hold Eliquis for 2 days prior to procedure.

## 2020-07-06 ENCOUNTER — Telehealth: Payer: Self-pay | Admitting: Internal Medicine

## 2020-07-06 NOTE — Telephone Encounter (Signed)
Faxed [facility] for continue current therapy faxed on 07-06-20

## 2020-07-07 ENCOUNTER — Encounter: Payer: Self-pay | Admitting: Internal Medicine

## 2020-07-07 ENCOUNTER — Other Ambulatory Visit: Payer: Self-pay

## 2020-07-07 ENCOUNTER — Ambulatory Visit (AMBULATORY_SURGERY_CENTER): Payer: Medicare Other | Admitting: Internal Medicine

## 2020-07-07 VITALS — BP 110/73 | HR 83 | Temp 96.2°F | Resp 18 | Ht 71.0 in | Wt 218.0 lb

## 2020-07-07 DIAGNOSIS — Z86010 Personal history of colonic polyps: Secondary | ICD-10-CM | POA: Diagnosis not present

## 2020-07-07 DIAGNOSIS — D122 Benign neoplasm of ascending colon: Secondary | ICD-10-CM

## 2020-07-07 DIAGNOSIS — D120 Benign neoplasm of cecum: Secondary | ICD-10-CM

## 2020-07-07 MED ORDER — SODIUM CHLORIDE 0.9 % IV SOLN: 500.0000 mL | Freq: Once | INTRAVENOUS | Status: DC

## 2020-07-07 NOTE — Progress Notes (Signed)
pt tolerated well. VSS. awake and to recovery. Report given to RN.  

## 2020-07-07 NOTE — Progress Notes (Signed)
Called to room to assist during endoscopic procedure.  Patient ID and intended procedure confirmed with present staff. Received instructions for my participation in the procedure from the performing physician.  

## 2020-07-07 NOTE — Patient Instructions (Signed)
Information on polyps, diverticulosis and hemorrhoids given to you today.  Await pathology results.  Resume Eliquis at prior dose tomorrow.  Resume previous diet and medications.  YOU HAD AN ENDOSCOPIC PROCEDURE TODAY AT [facility]:   Refer to the procedure report that was given to you for any specific questions about what was found during the examination.  If the procedure report does not answer your questions, please call your gastroenterologist to clarify.  If you requested that your care partner not be given the details of your procedure findings, then the procedure report has been included in a sealed envelope for you to review at your convenience later.  YOU SHOULD EXPECT: Some feelings of bloating in the abdomen. Passage of more gas than usual.  Walking can help get rid of the air that was put into your GI tract during the procedure and reduce the bloating. If you had a lower endoscopy (such as a colonoscopy or flexible sigmoidoscopy) you may notice spotting of blood in your stool or on the toilet paper. If you underwent a bowel prep for your procedure, you may not have a normal bowel movement for a few days.  Please Note:  You might notice some irritation and congestion in your nose or some drainage.  This is from the oxygen used during your procedure.  There is no need for concern and it should clear up in a day or so.  SYMPTOMS TO REPORT IMMEDIATELY:   Following lower endoscopy (colonoscopy or flexible sigmoidoscopy):  Excessive amounts of blood in the stool  Significant tenderness or worsening of abdominal pains  Swelling of the abdomen that is new, acute  Fever of 100F or higher   For urgent or emergent issues, a gastroenterologist can be reached at any hour by calling [phone]. Do not use MyChart messaging for urgent concerns.    DIET:  We do recommend a small meal at first, but then you may proceed to your regular diet.  Drink plenty of fluids but  you should avoid alcoholic beverages for 24 hours.  ACTIVITY:  You should plan to take it easy for the rest of today and you should NOT DRIVE or use heavy machinery until tomorrow (because of the sedation medicines used during the test).    FOLLOW UP: Our staff will call the number listed on your records 48-72 hours following your procedure to check on you and address any questions or concerns that you may have regarding the information given to you following your procedure. If we do not reach you, we will leave a message.  We will attempt to reach you two times.  During this call, we will ask if you have developed any symptoms of COVID 19. If you develop any symptoms (ie: fever, flu-like symptoms, shortness of breath, cough etc.) before then, please call [phone].  If you test positive for Covid 19 in the 2 weeks post procedure, please call and report this information to [place].    If any biopsies were taken you will be contacted by phone or by letter within the next 1-3 weeks.  Please call us at [phone] if you have not heard about the biopsies in 3 weeks.    SIGNATURES/CONFIDENTIALITY: You and/or your care partner have signed paperwork which will be entered into your electronic medical record.  These signatures attest to the fact that that the information above on your After Visit Summary has been reviewed and is understood.  Full responsibility of the confidentiality  of this discharge information lies with you and/or your care-partner.

## 2020-07-07 NOTE — Op Note (Signed)
[name] Procedure Date: 07/07/2020 2:07 PM MRN: [id number] Endoscopist: Jerene Bears , MD Age: 74 Referring MD:  Date of Birth: [birth year] Gender: Male Account #: [id number] Procedure:                Colonoscopy Indications:              High risk colon cancer surveillance: Personal                            history of non-advanced adenoma, Last colonoscopy:                            October 2013 Medicines:                Monitored Anesthesia Care Procedure:                Pre-Anesthesia Assessment:                           - Prior to the procedure, a History and Physical                            was performed, and patient medications and                            allergies were reviewed. The patient's tolerance of                            previous anesthesia was also reviewed. The risks                            and benefits of the procedure and the sedation                            options and risks were discussed with the patient.                            All questions were answered, and informed consent                            was obtained. Prior Anticoagulants: The patient has                            taken Eliquis (apixaban), last dose was 2 days                            prior to procedure. ASA Grade Assessment: III - A                            patient with severe systemic disease. After                            reviewing the risks and benefits, the patient was  deemed in satisfactory condition to undergo the                            procedure.                           After obtaining informed consent, the colonoscope                            was passed under direct vision. Throughout the                            procedure, the patient's blood pressure, pulse, and                            oxygen saturations were monitored continuously. The                            Colonoscope  was introduced through the anus and                            advanced to the cecum, identified by appendiceal                            orifice and ileocecal valve. The colonoscopy was                            performed without difficulty. The patient tolerated                            the procedure well. The quality of the bowel                            preparation was good. The ileocecal valve,                            appendiceal orifice, and rectum were photographed. Scope In: 2:18:41 PM Scope Out: 2:34:13 PM Scope Withdrawal Time: 0 hours 13 minutes 42 seconds  Total Procedure Duration: 0 hours 15 minutes 32 seconds  Findings:                 The digital rectal exam was normal.                           A 5 mm polyp was found in the cecum. The polyp was                            sessile. The polyp was removed with a cold snare.                            Resection and retrieval were complete.                           Three sessile polyps were found in the ascending  colon. The polyps were 3 to 6 mm in size. These                            polyps were removed with a cold snare. Resection                            and retrieval were complete.                           Multiple small and large-mouthed diverticula were                            found in the sigmoid colon and descending colon.                           Internal hemorrhoids were found during                            retroflexion. The hemorrhoids were small. Complications:            No immediate complications. Estimated Blood Loss:     Estimated blood loss was minimal. Impression:               - One 5 mm polyp in the cecum, removed with a cold                            snare. Resected and retrieved.                           - Three 3 to 6 mm polyps in the ascending colon,                            removed with a cold snare. Resected and retrieved.                           -  Diverticulosis in the sigmoid colon and in the                            descending colon.                           - Small internal hemorrhoids. Recommendation:           - Patient has a contact number available for                            emergencies. The signs and symptoms of potential                            delayed complications were discussed with the                            patient. Return to normal activities tomorrow.  Written discharge instructions were provided to the                            patient.                           - Resume previous diet.                           - Continue present medications.                           - Resume Eliquis (apixaban) at prior dose tomorrow.                           - Await pathology results.                           - Repeat colonoscopy is recommended. The                            colonoscopy date will be determined after pathology                            results from today's exam become available for                            review. Jerene Bears, MD 07/07/2020 2:38:21 PM This report has been signed electronically.

## 2020-07-09 ENCOUNTER — Telehealth: Payer: Self-pay

## 2020-07-09 ENCOUNTER — Telehealth: Payer: Self-pay | Admitting: *Deleted

## 2020-07-09 ENCOUNTER — Other Ambulatory Visit: Payer: Self-pay | Admitting: Physician Assistant

## 2020-07-09 NOTE — Telephone Encounter (Signed)
  Follow up Call-  Call back number 07/07/2020  Post procedure Call Back phone  # [phone]  Permission to leave phone message Yes  Some recent data might be hidden     Patient questions:  Number is out of service.

## 2020-07-09 NOTE — Telephone Encounter (Signed)
  Follow up Call-  Call back number 07/07/2020  Post procedure Call Back phone  # [phone]  Permission to leave phone message Yes  Some recent data might be hidden     Patient questions:  Do you have a fever, pain , or abdominal swelling? No. Pain Score  0 *  Have you tolerated food without any problems? Yes.    Have you been able to return to your normal activities? Yes.    Do you have any questions about your discharge instructions: Diet   No. Medications  No. Follow up visit  No.  Do you have questions or concerns about your Care? No.  Actions: * If pain score is 4 or above: No action needed, pain <4.  1. Have you developed a fever since your procedure? no  2.   Have you had an respiratory symptoms (SOB or cough) since your procedure? no  3.   Have you tested positive for COVID 19 since your procedure no  4.   Have you had any family members/close contacts diagnosed with the COVID 19 since your procedure?  no   If yes to any of these questions please route to Joylene John, RN and Joella Prince, RN

## 2020-07-12 ENCOUNTER — Encounter: Payer: Self-pay | Admitting: Internal Medicine
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
## 2020-07-26 ENCOUNTER — Other Ambulatory Visit: Payer: Self-pay

## 2020-07-26 ENCOUNTER — Encounter: Payer: Self-pay | Admitting: Cardiovascular Disease

## 2020-07-26 ENCOUNTER — Ambulatory Visit (INDEPENDENT_AMBULATORY_CARE_PROVIDER_SITE_OTHER): Payer: Medicare Other | Admitting: Cardiovascular Disease

## 2020-07-26 VITALS — BP 128/90 | HR 68 | Ht 71.0 in | Wt 215.4 lb

## 2020-07-26 DIAGNOSIS — I10 Essential (primary) hypertension: Secondary | ICD-10-CM | POA: Diagnosis not present

## 2020-07-26 DIAGNOSIS — I4821 Permanent atrial fibrillation: Secondary | ICD-10-CM | POA: Diagnosis not present

## 2020-07-26 DIAGNOSIS — E785 Hyperlipidemia, unspecified: Secondary | ICD-10-CM

## 2020-07-26 DIAGNOSIS — I4892 Unspecified atrial flutter: Secondary | ICD-10-CM

## 2020-07-26 MED ORDER — EZETIMIBE 10 MG PO TABS: 10.0000 mg | ORAL_TABLET | Freq: Every day | ORAL | 3 refills | Status: DC

## 2020-07-26 NOTE — Patient Instructions (Signed)
Medication Instructions:  Please start zetia one pill a day  If you need a refill on your cardiac medications before your next appointment, please call your pharmacy.    Lab work: No new labs needed   If you have labs (blood work) drawn today and your tests are completely normal, you will receive your results only by: [name] MyChart Message (if you have MyChart) OR . A paper copy in the mail If you have any lab test that is abnormal or we need to change your treatment, we will call you to review the results.   Testing/Procedures: No new testing needed   Follow-Up: At [facility], you and your health needs are our priority.  As part of our continuing mission to provide you with exceptional heart care, we have created designated Provider Care Teams.  These Care Teams include your primary Cardiologist (physician) and Advanced Practice Providers (APPs -  Physician Assistants and Nurse Practitioners) who all work together to provide you with the care you need, when you need it.  . You will need a follow up appointment in 12 months  . Providers on your designated Care Team:   . Murray Hodgkins, NP . Christell Faith, PA-C . Marrianne Mood, PA-C  Any Other Special Instructions Will Be Listed Below (If Applicable).  COVID-19 Vaccine Information can be found at: [email or web address].uk For questions related to vaccine distribution or appointments, please email vaccine@[email or web address] [email or web address] or call [phone].

## 2020-08-10 ENCOUNTER — Encounter (INDEPENDENT_AMBULATORY_CARE_PROVIDER_SITE_OTHER): Payer: Self-pay

## 2020-08-10 ENCOUNTER — Ambulatory Visit: Payer: Medicare Other

## 2020-08-10 ENCOUNTER — Telehealth: Payer: Self-pay

## 2020-08-10 NOTE — Telephone Encounter (Signed)
Patient not available when called for scheduled awv. No answer. Call forwarded to voicemail. Left message with office phone number for rescheduling.

## 2020-09-06 ENCOUNTER — Encounter: Payer: Medicare Other | Admitting: Internal Medicine

## 2020-09-22 ENCOUNTER — Encounter: Payer: Self-pay | Admitting: Internal Medicine

## 2020-09-22 ENCOUNTER — Telehealth: Payer: Self-pay

## 2020-09-22 ENCOUNTER — Other Ambulatory Visit: Payer: Self-pay

## 2020-09-22 ENCOUNTER — Ambulatory Visit (INDEPENDENT_AMBULATORY_CARE_PROVIDER_SITE_OTHER): Payer: Medicare Other | Admitting: Internal Medicine

## 2020-09-22 VITALS — BP 118/82 | HR 50 | Temp 97.4°F | Ht 71.0 in | Wt 222.0 lb

## 2020-09-22 DIAGNOSIS — I4892 Unspecified atrial flutter: Secondary | ICD-10-CM | POA: Diagnosis not present

## 2020-09-22 DIAGNOSIS — Z1329 Encounter for screening for other suspected endocrine disorder: Secondary | ICD-10-CM

## 2020-09-22 DIAGNOSIS — R600 Localized edema: Secondary | ICD-10-CM

## 2020-09-22 DIAGNOSIS — Z0000 Encounter for general adult medical examination without abnormal findings: Secondary | ICD-10-CM

## 2020-09-22 DIAGNOSIS — Z1159 Encounter for screening for other viral diseases: Secondary | ICD-10-CM

## 2020-09-22 DIAGNOSIS — I4821 Permanent atrial fibrillation: Secondary | ICD-10-CM

## 2020-09-22 DIAGNOSIS — Z0184 Encounter for antibody response examination: Secondary | ICD-10-CM

## 2020-09-22 DIAGNOSIS — Z125 Encounter for screening for malignant neoplasm of prostate: Secondary | ICD-10-CM

## 2020-09-22 DIAGNOSIS — I10 Essential (primary) hypertension: Secondary | ICD-10-CM

## 2020-09-22 DIAGNOSIS — E782 Mixed hyperlipidemia: Secondary | ICD-10-CM

## 2020-09-22 DIAGNOSIS — R7303 Prediabetes: Secondary | ICD-10-CM

## 2020-09-22 MED ORDER — APIXABAN 5 MG PO TABS
5.0000 mg | ORAL_TABLET | Freq: Two times a day (BID) | ORAL | 1 refills | Status: DC
Start: 2020-09-22 — End: 2021-03-28

## 2020-09-22 MED ORDER — CHLORTHALIDONE 25 MG PO TABS: 12.5000 mg | ORAL_TABLET | Freq: Every day | ORAL | 3 refills | Status: DC | PRN

## 2020-09-22 MED ORDER — METOPROLOL TARTRATE 100 MG PO TABS: 100.0000 mg | ORAL_TABLET | Freq: Two times a day (BID) | ORAL | 3 refills | Status: DC

## 2020-09-22 NOTE — Telephone Encounter (Addendum)
Pt can stay on Eliquis in 2022, will just need a prior authorization submitted in January once his new formulary starts. PharmD team will take care of this.  Spoke with pt who prefers to stay on Eliquis vs change to another blood thinner since he has been taking Eliquis for 6+ years without issue. He states he has been paying $40 for a 1 month supply of Eliquis. He qualifies for $10/month copay card. I have activated this and called information to the pharmacy. He is aware to continue Eliquis. Pt appreciative for assistance and cost savings.

## 2020-09-22 NOTE — Patient Instructions (Signed)
schedule Man eye please call to schedule
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
## 2020-09-22 NOTE — Addendum Note (Signed)
Addended by: Nyari Olsson E on: 09/22/2020 03:10 PM   Modules accepted: Orders

## 2020-09-22 NOTE — Progress Notes (Signed)
Received a message from primary care that Eliquis was not covered by his insurance Xarelto is covered and we can change him to Xarelto 20 mg once a day He should have no issues transitioning if he is nervous Thx TG

## 2020-09-22 NOTE — Telephone Encounter (Signed)
-----   Message from Minna Merritts, MD sent at 09/22/2020  2:15 PM EST -----   ----- Message ----- From: McLean-Scocuzza, Nino Glow, MD Sent: 09/22/2020  10:08 AM EST To: Minna Merritts, MD  Pt here stating eliquis not covered 1st of year 10/23/20 and covered meds coumadin and xarelto but pt nervous about changing due to being on eliquis 8-9 years reviewed a letter he had today Can you all reach out to help?

## 2020-09-22 NOTE — Telephone Encounter (Signed)
Error, duplicate encounter

## 2020-09-22 NOTE — Telephone Encounter (Addendum)
Will route to pharmD to assist   Received a message from primary care that Eliquis was not covered by his insurance Xarelto is covered and we can change him to Xarelto 20 mg once a day He should have no issues transitioning if he is nervous Thx TG

## 2020-09-23 NOTE — Telephone Encounter (Signed)
Noted  

## 2020-10-05 ENCOUNTER — Other Ambulatory Visit: Payer: Self-pay | Admitting: Family

## 2020-10-06 NOTE — Telephone Encounter (Signed)
Rx request sent to pharmacy.  

## 2020-10-20 ENCOUNTER — Other Ambulatory Visit: Payer: Medicare Other

## 2020-10-27 ENCOUNTER — Telehealth: Payer: Self-pay | Admitting: Pharmacist

## 2020-10-27 NOTE — Telephone Encounter (Signed)
Eliquis prior authorization approved through 10/26/21. Pt is aware and was appreciative for assistance.

## 2020-11-01 ENCOUNTER — Encounter (INDEPENDENT_AMBULATORY_CARE_PROVIDER_SITE_OTHER): Payer: Self-pay

## 2020-11-01 ENCOUNTER — Ambulatory Visit (INDEPENDENT_AMBULATORY_CARE_PROVIDER_SITE_OTHER): Payer: Medicare Other

## 2020-11-01 VITALS — Ht 71.0 in | Wt 222.0 lb

## 2020-11-01 DIAGNOSIS — Z0000 Encounter for general adult medical examination without abnormal findings: Secondary | ICD-10-CM | POA: Diagnosis not present
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
## 2020-11-01 NOTE — Patient Instructions (Addendum)
Carl Alvarez , Thank you for taking time to come for your Medicare Wellness Visit. I appreciate your ongoing commitment to your health goals. Please review the following plan we discussed and let me know if I can assist you in the future.   These are the goals we discussed: Goals    . Follow up with Primary Care Provider     As needed       This is a list of the screening recommended for you and due dates:  Health Maintenance  Topic Date Due  . Colon Cancer Screening  07/08/2023  . Tetanus Vaccine  04/17/2028  . Flu Shot  Completed  . COVID-19 Vaccine  Completed  .  Hepatitis C: One time screening is recommended by Center for Disease Control  (CDC) for  adults born from [birth year] through 1966.   Completed  . Pneumonia vaccines  Completed    Immunizations Immunization History  Administered Date(s) Administered  . Hepb-cpg 08/07/2019, 09/16/2019, 03/24/2020, 05/27/2020  . Influenza Split 07/01/2011, 07/08/2012, 07/02/2014  . Influenza, High Dose Seasonal PF 07/28/2017, 09/24/2018, 06/27/2019  . Influenza,inj,Quad PF,6+ Mos 08/12/2013  . Influenza-Unspecified 07/23/2020  . PFIZER SARS-COV-2 Vaccination 10/29/2019, 11/19/2019  . Pneumococcal Conjugate-13 04/04/2018  . Pneumococcal Polysaccharide-23 11/29/2011, 10/07/2013  . Tdap 04/17/2018   Keep all routine maintenance appointments.   Follow up 03/23/21 @ 1100.  Advanced directives: End of life planning; Advance aging; Advanced directives discussed.  Copy of current HCPOA/Living Will requested.    Conditions/risks identified: none new.   Follow up in one year for your annual wellness visit.   Preventive Care 24 Years and Older, Male Preventive care refers to lifestyle choices and visits with your health care provider that can promote health and wellness. What does preventive care include?  A yearly physical exam. This is also called an annual well check.  Dental exams once or twice a year.  Routine eye exams. Ask your health  care provider how often you should have your eyes checked.  Personal lifestyle choices, including:  Daily care of your teeth and gums.  Regular physical activity.  Eating a healthy diet.  Avoiding tobacco and drug use.  Limiting alcohol use.  Practicing safe sex.  Taking low doses of aspirin every day.  Taking vitamin and mineral supplements as recommended by your health care provider. What happens during an annual well check? The services and screenings done by your health care provider during your annual well check will depend on your age, overall health, lifestyle risk factors, and family history of disease. Counseling  Your health care provider may ask you questions about your:  Alcohol use.  Tobacco use.  Drug use.  Emotional well-being.  Home and relationship well-being.  Sexual activity.  Eating habits.  History of falls.  Memory and ability to understand (cognition).  Work and work Statistician. Screening  You may have the following tests or measurements:  Height, weight, and BMI.  Blood pressure.  Lipid and cholesterol levels. These may be checked every 5 years, or more frequently if you are over 52 years old.  Skin check.  Lung cancer screening. You may have this screening every year starting at age 60 if you have a 30-pack-year history of smoking and currently smoke or have quit within the past 15 years.  Fecal occult blood test (FOBT) of the stool. You may have this test every year starting at age 45.  Flexible sigmoidoscopy or colonoscopy. You may have a sigmoidoscopy every 5 years or a  colonoscopy every 10 years starting at age 59.  Prostate cancer screening. Recommendations will vary depending on your family history and other risks.  Hepatitis C blood test.  Hepatitis B blood test.  Sexually transmitted disease (STD) testing.  Diabetes screening. This is done by checking your blood sugar (glucose) after you have not eaten for a while  (fasting). You may have this done every 1-3 years.  Abdominal aortic aneurysm (AAA) screening. You may need this if you are a current or former smoker.  Osteoporosis. You may be screened starting at age 32 if you are at high risk. Talk with your health care provider about your test results, treatment options, and if necessary, the need for more tests. Vaccines  Your health care provider may recommend certain vaccines, such as:  Influenza vaccine. This is recommended every year.  Tetanus, diphtheria, and acellular pertussis (Tdap, Td) vaccine. You may need a Td booster every 10 years.  Zoster vaccine. You may need this after age 48.  Pneumococcal 13-valent conjugate (PCV13) vaccine. One dose is recommended after age 52.  Pneumococcal polysaccharide (PPSV23) vaccine. One dose is recommended after age 49. Talk to your health care provider about which screenings and vaccines you need and how often you need them. This information is not intended to replace advice given to you by your health care provider. Make sure you discuss any questions you have with your health care provider. Document Released: 11/05/2015 Document Revised: 06/28/2016 Document Reviewed: 08/10/2015 Elsevier Interactive Patient Education  2017 [place] Prevention in the Home Falls can cause injuries. They can happen to people of all ages. There are many things you can do to make your home safe and to help prevent falls. What can I do on the outside of my home?  Regularly fix the edges of walkways and driveways and fix any cracks.  Remove anything that might make you trip as you walk through a door, such as a raised step or threshold.  Trim any bushes or trees on the path to your home.  Use bright outdoor lighting.  Clear any walking paths of anything that might make someone trip, such as rocks or tools.  Regularly check to see if handrails are loose or broken. Make sure that both sides of any steps have  handrails.  Any raised decks and porches should have guardrails on the edges.  Have any leaves, snow, or ice cleared regularly.  Use sand or salt on walking paths during winter.  Clean up any spills in your garage right away. This includes oil or grease spills. What can I do in the bathroom?  Use night lights.  Install grab bars by the toilet and in the tub and shower. Do not use towel bars as grab bars.  Use non-skid mats or decals in the tub or shower.  If you need to sit down in the shower, use a plastic, non-slip stool.  Keep the floor dry. Clean up any water that spills on the floor as soon as it happens.  Remove soap buildup in the tub or shower regularly.  Attach bath mats securely with double-sided non-slip rug tape.  Do not have throw rugs and other things on the floor that can make you trip. What can I do in the bedroom?  Use night lights.  Make sure that you have a light by your bed that is easy to reach.  Do not use any sheets or blankets that are too big for your bed. They  should not hang down onto the floor.  Have a firm chair that has side arms. You can use this for support while you get dressed.  Do not have throw rugs and other things on the floor that can make you trip. What can I do in the kitchen?  Clean up any spills right away.  Avoid walking on wet floors.  Keep items that you use a lot in easy-to-reach places.  If you need to reach something above you, use a strong step stool that has a grab bar.  Keep electrical cords out of the way.  Do not use floor polish or wax that makes floors slippery. If you must use wax, use non-skid floor wax.  Do not have throw rugs and other things on the floor that can make you trip. What can I do with my stairs?  Do not leave any items on the stairs.  Make sure that there are handrails on both sides of the stairs and use them. Fix handrails that are broken or loose. Make sure that handrails are as long as  the stairways.  Check any carpeting to make sure that it is firmly attached to the stairs. Fix any carpet that is loose or worn.  Avoid having throw rugs at the top or bottom of the stairs. If you do have throw rugs, attach them to the floor with carpet tape.  Make sure that you have a light switch at the top of the stairs and the bottom of the stairs. If you do not have them, ask someone to add them for you. What else can I do to help prevent falls?  Wear shoes that:  Do not have high heels.  Have rubber bottoms.  Are comfortable and fit you well.  Are closed at the toe. Do not wear sandals.  If you use a stepladder:  Make sure that it is fully opened. Do not climb a closed stepladder.  Make sure that both sides of the stepladder are locked into place.  Ask someone to hold it for you, if possible.  Clearly mark and make sure that you can see:  Any grab bars or handrails.  First and last steps.  Where the edge of each step is.  Use tools that help you move around (mobility aids) if they are needed. These include:  Canes.  Walkers.  Scooters.  Crutches.  Turn on the lights when you go into a dark area. Replace any light bulbs as soon as they burn out.  Set up your furniture so you have a clear path. Avoid moving your furniture around.  If any of your floors are uneven, fix them.  If there are any pets around you, be aware of where they are.  Review your medicines with your doctor. Some medicines can make you feel dizzy. This can increase your chance of falling. Ask your doctor what other things that you can do to help prevent falls. This information is not intended to replace advice given to you by your health care provider. Make sure you discuss any questions you have with your health care provider. Document Released: 08/05/2009 Document Revised: 03/16/2016 Document Reviewed: 11/13/2014 Elsevier Interactive Patient Education  2017 [employer].

## 2020-12-22 NOTE — Telephone Encounter (Signed)
Faxed on 12-21-20 to [facility] for prescriber response form  Signed and faxed  On 12-21-20 to [phone]

## 2021-02-09 ENCOUNTER — Other Ambulatory Visit: Payer: Self-pay | Admitting: Family

## 2021-03-04 ENCOUNTER — Other Ambulatory Visit: Payer: Self-pay | Admitting: Cardiovascular Disease

## 2021-03-07 ENCOUNTER — Telehealth: Payer: Self-pay | Admitting: Internal Medicine

## 2021-03-07 NOTE — Telephone Encounter (Signed)
For your information  

## 2021-03-07 NOTE — Telephone Encounter (Signed)
PT came into office to advise that their having a reaction to their meds where their bp is running high and their feeling drowsy and dizzy. Scheduled a appt tomorrow.

## 2021-03-07 NOTE — Telephone Encounter (Signed)
Please do orthostatics at appt before I see him  Will cc cardiology for input as well

## 2021-03-08 ENCOUNTER — Other Ambulatory Visit: Payer: Self-pay

## 2021-03-08 ENCOUNTER — Encounter: Payer: Self-pay | Admitting: Family

## 2021-03-08 ENCOUNTER — Telehealth: Payer: Self-pay | Admitting: Internal Medicine

## 2021-03-08 ENCOUNTER — Ambulatory Visit (INDEPENDENT_AMBULATORY_CARE_PROVIDER_SITE_OTHER): Payer: Medicare Other | Admitting: Family

## 2021-03-08 ENCOUNTER — Encounter: Payer: Self-pay | Admitting: Internal Medicine

## 2021-03-08 ENCOUNTER — Inpatient Hospital Stay
Admission: EM | Admit: 2021-03-08 | Discharge: 2021-03-10 | DRG: 065 | Disposition: A | Discharge status: Home / Self Care | Payer: Medicare Other | Attending: Internal Medicine | Admitting: Internal Medicine

## 2021-03-08 ENCOUNTER — Encounter: Payer: Self-pay | Admitting: Emergency Medicine

## 2021-03-08 ENCOUNTER — Ambulatory Visit
Admission: RE | Admit: 2021-03-08 | Discharge: 2021-03-08 | Disposition: A | Discharge status: Home / Self Care | Payer: Medicare Other | Source: Ambulatory Visit | Attending: Family | Admitting: Family

## 2021-03-08 VITALS — Temp 97.6°F | Ht 71.0 in | Wt 225.0 lb

## 2021-03-08 DIAGNOSIS — Z96651 Presence of right artificial knee joint: Secondary | ICD-10-CM | POA: Diagnosis present

## 2021-03-08 DIAGNOSIS — R29702 NIHSS score 2: Secondary | ICD-10-CM | POA: Diagnosis not present

## 2021-03-08 DIAGNOSIS — I482 Chronic atrial fibrillation, unspecified: Secondary | ICD-10-CM | POA: Diagnosis not present

## 2021-03-08 DIAGNOSIS — Z8616 Personal history of COVID-19: Secondary | ICD-10-CM | POA: Diagnosis not present

## 2021-03-08 DIAGNOSIS — I700 Atherosclerosis of aorta: Secondary | ICD-10-CM | POA: Diagnosis not present

## 2021-03-08 DIAGNOSIS — Z1159 Encounter for screening for other viral diseases: Secondary | ICD-10-CM | POA: Diagnosis not present

## 2021-03-08 DIAGNOSIS — I6529 Occlusion and stenosis of unspecified carotid artery: Secondary | ICD-10-CM | POA: Diagnosis present

## 2021-03-08 DIAGNOSIS — I499 Cardiac arrhythmia, unspecified: Secondary | ICD-10-CM | POA: Diagnosis not present

## 2021-03-08 DIAGNOSIS — K219 Gastro-esophageal reflux disease without esophagitis: Secondary | ICD-10-CM | POA: Diagnosis present

## 2021-03-08 DIAGNOSIS — E662 Morbid (severe) obesity with alveolar hypoventilation: Secondary | ICD-10-CM | POA: Diagnosis present

## 2021-03-08 DIAGNOSIS — Z8042 Family history of malignant neoplasm of prostate: Secondary | ICD-10-CM | POA: Diagnosis not present

## 2021-03-08 DIAGNOSIS — R531 Weakness: Secondary | ICD-10-CM

## 2021-03-08 DIAGNOSIS — Z87891 Personal history of nicotine dependence: Secondary | ICD-10-CM | POA: Diagnosis not present

## 2021-03-08 DIAGNOSIS — J019 Acute sinusitis, unspecified: Secondary | ICD-10-CM | POA: Diagnosis not present

## 2021-03-08 DIAGNOSIS — R29810 Facial weakness: Secondary | ICD-10-CM | POA: Diagnosis not present

## 2021-03-08 DIAGNOSIS — Z833 Family history of diabetes mellitus: Secondary | ICD-10-CM | POA: Diagnosis not present

## 2021-03-08 DIAGNOSIS — H539 Unspecified visual disturbance: Secondary | ICD-10-CM | POA: Diagnosis not present

## 2021-03-08 DIAGNOSIS — I4892 Unspecified atrial flutter: Secondary | ICD-10-CM

## 2021-03-08 DIAGNOSIS — I6389 Other cerebral infarction: Principal | ICD-10-CM | POA: Diagnosis present

## 2021-03-08 DIAGNOSIS — Z86010 Personal history of colonic polyps: Secondary | ICD-10-CM

## 2021-03-08 DIAGNOSIS — R278 Other lack of coordination: Secondary | ICD-10-CM | POA: Diagnosis present

## 2021-03-08 DIAGNOSIS — R269 Unspecified abnormalities of gait and mobility: Secondary | ICD-10-CM | POA: Diagnosis present

## 2021-03-08 DIAGNOSIS — I10 Essential (primary) hypertension: Secondary | ICD-10-CM | POA: Diagnosis not present

## 2021-03-08 DIAGNOSIS — I4821 Permanent atrial fibrillation: Secondary | ICD-10-CM | POA: Diagnosis present

## 2021-03-08 DIAGNOSIS — Z7901 Long term (current) use of anticoagulants: Secondary | ICD-10-CM

## 2021-03-08 DIAGNOSIS — I2510 Atherosclerotic heart disease of native coronary artery without angina pectoris: Secondary | ICD-10-CM | POA: Diagnosis present

## 2021-03-08 DIAGNOSIS — R0981 Nasal congestion: Secondary | ICD-10-CM | POA: Diagnosis present

## 2021-03-08 DIAGNOSIS — I4891 Unspecified atrial fibrillation: Secondary | ICD-10-CM | POA: Diagnosis not present

## 2021-03-08 DIAGNOSIS — R7303 Prediabetes: Secondary | ICD-10-CM

## 2021-03-08 DIAGNOSIS — I6523 Occlusion and stenosis of bilateral carotid arteries: Secondary | ICD-10-CM | POA: Diagnosis not present

## 2021-03-08 DIAGNOSIS — R5383 Other fatigue: Secondary | ICD-10-CM

## 2021-03-08 DIAGNOSIS — I25118 Atherosclerotic heart disease of native coronary artery with other forms of angina pectoris: Secondary | ICD-10-CM | POA: Diagnosis not present

## 2021-03-08 DIAGNOSIS — Z0184 Encounter for antibody response examination: Secondary | ICD-10-CM | POA: Diagnosis not present

## 2021-03-08 DIAGNOSIS — R002 Palpitations: Secondary | ICD-10-CM

## 2021-03-08 DIAGNOSIS — I361 Nonrheumatic tricuspid (valve) insufficiency: Secondary | ICD-10-CM | POA: Diagnosis not present

## 2021-03-08 DIAGNOSIS — Z83438 Family history of other disorder of lipoprotein metabolism and other lipidemia: Secondary | ICD-10-CM

## 2021-03-08 DIAGNOSIS — Z1329 Encounter for screening for other suspected endocrine disorder: Secondary | ICD-10-CM

## 2021-03-08 DIAGNOSIS — G319 Degenerative disease of nervous system, unspecified: Secondary | ICD-10-CM | POA: Diagnosis not present

## 2021-03-08 DIAGNOSIS — Z880 Allergy status to penicillin: Secondary | ICD-10-CM

## 2021-03-08 DIAGNOSIS — J449 Chronic obstructive pulmonary disease, unspecified: Secondary | ICD-10-CM | POA: Diagnosis present

## 2021-03-08 DIAGNOSIS — Z20822 Contact with and (suspected) exposure to covid-19: Secondary | ICD-10-CM | POA: Diagnosis present

## 2021-03-08 DIAGNOSIS — Z6831 Body mass index (BMI) 31.0-31.9, adult: Secondary | ICD-10-CM

## 2021-03-08 DIAGNOSIS — Z8673 Personal history of transient ischemic attack (TIA), and cerebral infarction without residual deficits: Secondary | ICD-10-CM

## 2021-03-08 DIAGNOSIS — E785 Hyperlipidemia, unspecified: Secondary | ICD-10-CM | POA: Diagnosis not present

## 2021-03-08 DIAGNOSIS — I340 Nonrheumatic mitral (valve) insufficiency: Secondary | ICD-10-CM | POA: Diagnosis not present

## 2021-03-08 DIAGNOSIS — Z125 Encounter for screening for malignant neoplasm of prostate: Secondary | ICD-10-CM

## 2021-03-08 DIAGNOSIS — I081 Rheumatic disorders of both mitral and tricuspid valves: Secondary | ICD-10-CM | POA: Diagnosis present

## 2021-03-08 DIAGNOSIS — I639 Cerebral infarction, unspecified: Secondary | ICD-10-CM | POA: Diagnosis present

## 2021-03-08 DIAGNOSIS — Z8249 Family history of ischemic heart disease and other diseases of the circulatory system: Secondary | ICD-10-CM | POA: Diagnosis not present

## 2021-03-08 DIAGNOSIS — Z79899 Other long term (current) drug therapy: Secondary | ICD-10-CM | POA: Diagnosis not present

## 2021-03-08 DIAGNOSIS — Z743 Need for continuous supervision: Secondary | ICD-10-CM | POA: Diagnosis not present

## 2021-03-08 DIAGNOSIS — I6349 Cerebral infarction due to embolism of other cerebral artery: Secondary | ICD-10-CM | POA: Diagnosis not present

## 2021-03-08 LAB — COMPREHENSIVE METABOLIC PANEL
ALT: 22 U/L (ref 0–53)
ALT: 24 U/L (ref 0–44)
AST: 19 U/L (ref 0–37)
AST: 24 U/L (ref 15–41)
Albumin: 4.3 g/dL (ref 3.5–5.2)
Albumin: 3.8 g/dL (ref 3.5–5.0)
Alkaline Phosphatase: 94 U/L (ref 39–117)
Alkaline Phosphatase: 85 U/L (ref 38–126)
Anion gap: 8 (ref 5–15)
BUN: 16 mg/dL (ref 6–23)
BUN: 14 mg/dL (ref 8–23)
CO2: 31 mEq/L (ref 19–32)
CO2: 27 mmol/L (ref 22–32)
Calcium: 9.7 mg/dL (ref 8.4–10.5)
Calcium: 9.1 mg/dL (ref 8.9–10.3)
Chloride: 105 mEq/L (ref 96–112)
Chloride: 104 mmol/L (ref 98–111)
Creatinine, Ser: 0.9 mg/dL (ref 0.40–1.50)
Creatinine, Ser: 0.95 mg/dL (ref 0.61–1.24)
GFR, Estimated: >60 mL/min (ref >60)
GFR: 83.35 mL/min (ref >60.00)
Glucose, Bld: 88 mg/dL (ref 70–99)
Glucose, Bld: 96 mg/dL (ref 70–99)
Potassium: 4.5 mEq/L (ref 3.5–5.1)
Potassium: 3.7 mmol/L (ref 3.5–5.1)
Sodium: 141 mEq/L (ref 135–145)
Sodium: 139 mmol/L (ref 135–145)
Total Bilirubin: 0.8 mg/dL (ref 0.2–1.2)
Total Bilirubin: 1 mg/dL (ref 0.3–1.2)
Total Protein: 7.1 g/dL (ref 6.0–8.3)
Total Protein: 7 g/dL (ref 6.5–8.1)

## 2021-03-08 LAB — URINALYSIS, COMPLETE (UACMP) WITH MICROSCOPIC
Bacteria, UA: NONE SEEN
Bilirubin Urine: NEGATIVE
Glucose, UA: NEGATIVE mg/dL
Hgb urine dipstick: NEGATIVE
Ketones, ur: NEGATIVE mg/dL
Leukocytes,Ua: NEGATIVE
Nitrite: NEGATIVE
Protein, ur: NEGATIVE mg/dL
Specific Gravity, Urine: 1.02 (ref 1.005–1.030)
Squamous Epithelial / HPF: NONE SEEN (ref 0–5)
pH: 5 (ref 5.0–8.0)

## 2021-03-08 LAB — URINE DRUG SCREEN, QUALITATIVE (ARMC ONLY)
Amphetamines, Ur Screen: NOT DETECTED
Barbiturates, Ur Screen: NOT DETECTED
Benzodiazepine, Ur Scrn: NOT DETECTED
Cannabinoid 50 Ng, Ur ~~LOC~~: NOT DETECTED
Cocaine Metabolite,Ur ~~LOC~~: NOT DETECTED
MDMA (Ecstasy)Ur Screen: NOT DETECTED
Methadone Scn, Ur: NOT DETECTED
Opiate, Ur Screen: NOT DETECTED
Phencyclidine (PCP) Ur S: NOT DETECTED
Tricyclic, Ur Screen: NOT DETECTED

## 2021-03-08 LAB — DIFFERENTIAL
Abs Immature Granulocytes: 0.02 10*3/uL (ref 0.00–0.07)
Basophils Absolute: 0 10*3/uL (ref 0.0–0.1)
Basophils Relative: 1 %
Eosinophils Absolute: 0.2 10*3/uL (ref 0.0–0.5)
Eosinophils Relative: 3 %
Immature Granulocytes: 0 %
Lymphocytes Relative: 31 %
Lymphs Abs: 2.7 10*3/uL (ref 0.7–4.0)
Monocytes Absolute: 0.6 10*3/uL (ref 0.1–1.0)
Monocytes Relative: 7 %
Neutro Abs: 5.1 10*3/uL (ref 1.7–7.7)
Neutrophils Relative %: 58 %

## 2021-03-08 LAB — PSA, MEDICARE: PSA: 0.42 ng/ml (ref 0.10–4.00)

## 2021-03-08 LAB — POC COVID19 BINAXNOW: SARS Coronavirus 2 Ag: NEGATIVE

## 2021-03-08 LAB — CBC WITH DIFFERENTIAL/PLATELET
Basophils Absolute: 0 10*3/uL (ref 0.0–0.1)
Basophils Relative: 0.6 % (ref 0.0–3.0)
Eosinophils Absolute: 0.2 10*3/uL (ref 0.0–0.7)
Eosinophils Relative: 3 % (ref 0.0–5.0)
HCT: 42.1 % (ref 39.0–52.0)
Hemoglobin: 14.3 g/dL (ref 13.0–17.0)
Lymphocytes Relative: 35 % (ref 12.0–46.0)
Lymphs Abs: 2.6 10*3/uL (ref 0.7–4.0)
MCHC: 33.9 g/dL (ref 30.0–36.0)
MCV: 94.7 fl (ref 78.0–100.0)
Monocytes Absolute: 0.6 10*3/uL (ref 0.1–1.0)
Monocytes Relative: 8.1 % (ref 3.0–12.0)
Neutro Abs: 3.9 10*3/uL (ref 1.4–7.7)
Neutrophils Relative %: 53.3 % (ref 43.0–77.0)
Platelets: 127 10*3/uL — ABNORMAL LOW (ref 150.0–400.0)
RBC: 4.45 Mil/uL (ref 4.22–5.81)
RDW: 13 % (ref 11.5–15.5)
WBC: 7.3 10*3/uL (ref 4.0–10.5)

## 2021-03-08 LAB — CBC
HCT: 41.3 % (ref 39.0–52.0)
Hemoglobin: 13.9 g/dL (ref 13.0–17.0)
MCH: 31.9 pg (ref 26.0–34.0)
MCHC: 33.7 g/dL (ref 30.0–36.0)
MCV: 94.7 fL (ref 80.0–100.0)
Platelets: 143 10*3/uL — ABNORMAL LOW (ref 150–400)
RBC: 4.36 MIL/uL (ref 4.22–5.81)
RDW: 12.7 % (ref 11.5–15.5)
WBC: 8.6 10*3/uL (ref 4.0–10.5)
nRBC: 0 % (ref 0.0–0.2)

## 2021-03-08 LAB — LIPID PANEL
Cholesterol: 136 mg/dL (ref 0–200)
HDL: 45.9 mg/dL (ref >39.00)
LDL Cholesterol: 73 mg/dL (ref 0–99)
NonHDL: 89.68
Total CHOL/HDL Ratio: 3
Triglycerides: 85 mg/dL (ref 0.0–149.0)
VLDL: 17 mg/dL (ref 0.0–40.0)

## 2021-03-08 LAB — PROTIME-INR
INR: 1.2 (ref 0.8–1.2)
Prothrombin Time: 15.6 seconds — ABNORMAL HIGH (ref 11.4–15.2)

## 2021-03-08 LAB — APTT: aPTT: 31 seconds (ref 24–36)

## 2021-03-08 LAB — TSH: TSH: 0.93 u[IU]/mL (ref 0.35–4.50)

## 2021-03-08 LAB — ETHANOL: Alcohol, Ethyl (B): <10 mg/dL (ref <10)

## 2021-03-08 LAB — HEMOGLOBIN A1C: Hgb A1c MFr Bld: 6.2 % (ref 4.6–6.5)

## 2021-03-08 MED ORDER — ATORVASTATIN CALCIUM 20 MG PO TABS
80.0000 mg | ORAL_TABLET | Freq: Every day | ORAL | Status: DC
  Administered 2021-03-08 – 2021-03-09 (×2): 80 mg via ORAL
  Filled 2021-03-08 (×2): qty 4

## 2021-03-08 MED ORDER — SENNOSIDES-DOCUSATE SODIUM 8.6-50 MG PO TABS: 1.0000 | ORAL_TABLET | Freq: Every evening | ORAL | Status: DC | PRN

## 2021-03-08 MED ORDER — METOPROLOL TARTRATE 50 MG PO TABS
100.0000 mg | ORAL_TABLET | Freq: Two times a day (BID) | ORAL | Status: DC
  Administered 2021-03-08 – 2021-03-10 (×4): 100 mg via ORAL
  Filled 2021-03-08 (×4): qty 2

## 2021-03-08 MED ORDER — EZETIMIBE 10 MG PO TABS
10.0000 mg | ORAL_TABLET | Freq: Every day | ORAL | Status: DC
  Administered 2021-03-09 – 2021-03-10 (×2): 10 mg via ORAL
  Filled 2021-03-08 (×3): qty 1

## 2021-03-08 MED ORDER — HYDRALAZINE HCL 20 MG/ML IJ SOLN: 10.0000 mg | Freq: Three times a day (TID) | INTRAMUSCULAR | Status: DC | PRN

## 2021-03-08 MED ORDER — ACETAMINOPHEN 160 MG/5ML PO SOLN
650.0000 mg | ORAL | Status: DC | PRN
  Filled 2021-03-08: qty 20.3

## 2021-03-08 MED ORDER — DILTIAZEM HCL ER COATED BEADS 120 MG PO CP24
120.0000 mg | ORAL_CAPSULE | Freq: Every day | ORAL | Status: DC
  Administered 2021-03-09 – 2021-03-10 (×2): 120 mg via ORAL
  Filled 2021-03-08 (×3): qty 1

## 2021-03-08 MED ORDER — STROKE: EARLY STAGES OF RECOVERY BOOK: Freq: Once | Status: DC

## 2021-03-08 MED ORDER — ACETAMINOPHEN 650 MG RE SUPP: 650.0000 mg | RECTAL | Status: DC | PRN

## 2021-03-08 MED ORDER — DOXYCYCLINE HYCLATE 100 MG PO TABS: 100.0000 mg | ORAL_TABLET | Freq: Two times a day (BID) | ORAL | 0 refills | Status: DC

## 2021-03-08 MED ORDER — ASPIRIN 81 MG PO CHEW
81.0000 mg | CHEWABLE_TABLET | Freq: Every day | ORAL | Status: DC
  Administered 2021-03-08 – 2021-03-10 (×3): 81 mg via ORAL
  Filled 2021-03-08 (×3): qty 1

## 2021-03-08 MED ORDER — ACETAMINOPHEN 325 MG PO TABS: 650.0000 mg | ORAL_TABLET | ORAL | Status: DC | PRN

## 2021-03-08 MED ORDER — APIXABAN 5 MG PO TABS
5.0000 mg | ORAL_TABLET | Freq: Two times a day (BID) | ORAL | Status: DC
  Administered 2021-03-08 – 2021-03-10 (×4): 5 mg via ORAL
  Filled 2021-03-08 (×4): qty 1

## 2021-03-08 NOTE — Telephone Encounter (Signed)
Will route note to [facility] scheduling team to assist with follow up in 1 week with Dr. Rockey Situ or APP. Also will see if can facilitate echo/carotid duplex in our office.   Loel Dubonnet, NP

## 2021-03-08 NOTE — Telephone Encounter (Signed)
FYI mclean, gollan   Caitlin,   Good news. Blood pressure well controlled. He was not orthostatic  He had constellation of symptoms from fatigue, dizziness, URI symptoms. No cp, palpitations  EKG shows persistent atrial fib.  I have ordered MRI brain, US carotid, echo, cxr, covid test, baseline labs.   Pretty broad differentials at this time. We will seem him again in 2 weeks.   Can we arrange follow up with your team in the next week?

## 2021-03-08 NOTE — Telephone Encounter (Signed)
With persistent afib after DCCV/ablation would he benefit from antiarrhythmic drug as well?

## 2021-03-08 NOTE — Telephone Encounter (Signed)
MRI + stroke reviewed with pts daughter Alyse Low who is on the way to [facility]  1. Was pt compliant with eliquis? 2. If he had stroke on eliquis this needs further w/u neurology and cards tee/tte 3. S/p DCCV/ablation and still in Afib today  Does he need to be on antiarrhythymic drugs?  EP likely needs to be consulted   Dr. Olivia Mackie MCLean-Scocuzza

## 2021-03-08 NOTE — Assessment & Plan Note (Addendum)
Patient well appearing today. He is not toxic, febrile. Reassuring CV, HEENT and neurologic exam. He is not orthostatic and denies dizziness today.  Constellation of symptoms in regards to fatigue does correspond closely onset of congestion. Concern for bacterial rhinosinuitis and will treat with doxycycline. EKG showed persistent atrial fibrillation and no acute ischemia or changes from prior 08/2020.Differentials remain broad for constellation of symptoms including fatigue at this time include tia, structural cardiac disease ,  depression, sleep apnea, atrial fibrillation.  Pending covid test, MRI brain, US carotid, cxr, baseline labs. Will arrange follow up with cardiology. Referral placed to ophthalmology due to reported visual symptoms.  Advised any recurrence of vision changes to go to ED to ensure no more worrisome etiology such as retina etiology.  I have reviewed EKG and management plan with patient's primary, Dr Aundra Dubin, whom is in agreement
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
## 2021-03-08 NOTE — Assessment & Plan Note (Signed)
Controlled. Continue regimen at this time.

## 2021-03-08 NOTE — Patient Instructions (Addendum)
[facility]  Start doxycycline ( antibiotic)  Ensure to take probiotics while on antibiotics and also for 2 weeks after completion. This can either be by eating yogurt daily or taking a probiotic supplement over the counter such as Culturelle.It is important to re-colonize the gut with good bacteria and also to prevent any diarrheal infections associated with antibiotic use.   Ordered ultrasound of carotid arteries, heart, MRI brain  Urgent referral to opthalmology, if you have any vision loss or recurrence of sparks in your eyes, please go to emergency room immediately  Let us know if you dont hear back within a week in regards to an appointment being scheduled.    We will discuss sleep apnea at follow up.    Please let me know of any new concerns, symptoms.
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
## 2021-03-08 NOTE — Assessment & Plan Note (Signed)
Stable, chronic. Continue  eliquis 5 mg bid, diltiazem 164m qd, metoprolol tartrate 1063mBID. Will arrange follow up with Dr GoRockey Situ

## 2021-03-08 NOTE — ED Triage Notes (Addendum)
Pt arrived via [facility] from outpatient imaging center with c/o possible stroke per imaging staff.   Per EMS, pt was seen for MRI due to fatigue for last few days. Per EMS, imagining staff stated acute infarct present during scans so EMS was called.   Per EMS, stroke screen was negative at this time. VSS, CBG 140  Pt in NAD, no deficits noted, A&Ox4. Pt currently noted to be calling people on personal cellphone at this time. Pt denies any pain at this time.
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
## 2021-03-09 ENCOUNTER — Inpatient Hospital Stay (HOSPITAL_COMMUNITY)
Admit: 2021-03-09 | Discharge: 2021-03-09 | Disposition: A | Discharge status: Home / Self Care | Payer: Medicare Other | Attending: Internal Medicine | Admitting: Internal Medicine

## 2021-03-09 ENCOUNTER — Inpatient Hospital Stay: Payer: Medicare Other

## 2021-03-09 ENCOUNTER — Telehealth: Payer: Self-pay | Admitting: Family

## 2021-03-09 DIAGNOSIS — I4891 Unspecified atrial fibrillation: Secondary | ICD-10-CM

## 2021-03-09 DIAGNOSIS — I6389 Other cerebral infarction: Principal | ICD-10-CM

## 2021-03-09 LAB — ECHOCARDIOGRAM COMPLETE
AR max vel: 2.67 cm2
AV Area VTI: 2.73 cm2
AV Area mean vel: 2.78 cm2
AV Mean grad: 2 mmHg
AV Peak grad: 3.7 mmHg
Ao pk vel: 0.96 m/s
Area-P 1/2: 5.54 cm2
Height: 71 in
S' Lateral: 2.84 cm
Weight: 3599.99 oz

## 2021-03-09 LAB — HEMOGLOBIN A1C
Hgb A1c MFr Bld: 6.2 % — ABNORMAL HIGH (ref 4.8–5.6)
Mean Plasma Glucose: 131.24 mg/dL

## 2021-03-09 LAB — LIPID PANEL
Cholesterol: 125 mg/dL (ref 0–200)
HDL: 43 mg/dL (ref >40)
LDL Cholesterol: 67 mg/dL (ref 0–99)
Total CHOL/HDL Ratio: 2.9 RATIO
Triglycerides: 74 mg/dL (ref <150)
VLDL: 15 mg/dL (ref 0–40)

## 2021-03-09 LAB — HEPATITIS B SURFACE ANTIBODY, QUANTITATIVE: Hep B S AB Quant (Post): >1000 m[IU]/mL (ref >=10)

## 2021-03-09 LAB — SARS CORONAVIRUS 2 (TAT 6-24 HRS): SARS Coronavirus 2: NEGATIVE

## 2021-03-09 MED ORDER — HYDRALAZINE HCL 20 MG/ML IJ SOLN: 10.0000 mg | Freq: Three times a day (TID) | INTRAMUSCULAR | Status: DC | PRN

## 2021-03-09 NOTE — ED Notes (Signed)
Pt ambulatory to restroom, steady gait, NAD

## 2021-03-09 NOTE — Evaluation (Signed)
Occupational Therapy Evaluation Patient Details Name: Carl Alvarez MRN: [id number] DOB: 1946/04/12 Today's Date: 03/09/2021    History of Present Illness 75 y.o. male, right handed gentleman with medical history significant for atrial fibrillation status post previous cardioversion(currently in Afib) and on chronic anticoagulation with Eliquis.  Comorbidities include hypertension, hyperlipidemia, coronary disease and history of carotid artery stenosis.  Patient presents with a history of an early morning acute onset visual impairment.  Symptoms were transient but patient continues to have symptoms of intermittent dizziness, lightheadedness and gait disturbance. Pt with new acute infarct R corona radiate.   Clinical Impression   Upon entering the room, pt supine in bed with no c/o pain and agreeable to OT intervention. Pt reports living at home alone and independently. Pt demonstrates functional mobility,transfers, and LB dressing without use of AD independently this session. Pt initially requires increased time once getting out of bed but had no other concerns. Pt does not need skilled intervention at this time. Pt appears cognitively intact.No further follow up or equipment recommended. OT to SIGN OFF.    Follow Up Recommendations  No OT follow up    Equipment Recommendations  None recommended by OT       Precautions / Restrictions Precautions Precautions: Fall      Mobility Bed Mobility Overal bed mobility: Independent                  Transfers Overall transfer level: Independent Equipment used: None                  Balance Overall balance assessment: Modified Independent                                         ADL either performed or assessed with clinical judgement   ADL Overall ADL's : Modified independent                                       General ADL Comments: increased time to complete task but pt demonstrates  ability to perform functional transfers, dons clothing, ties shoe laces without LOB or physical assist.     Vision Patient Visual Report: No change from baseline              Pertinent Vitals/Pain Pain Assessment: No/denies pain     Hand Dominance Right   Extremity/Trunk Assessment Upper Extremity Assessment Upper Extremity Assessment: Overall WFL for tasks assessed   Lower Extremity Assessment Lower Extremity Assessment: Overall WFL for tasks assessed   Cervical / Trunk Assessment Cervical / Trunk Assessment: Normal   Communication Communication Communication: No difficulties   Cognition Arousal/Alertness: Awake/alert Behavior During Therapy: WFL for tasks assessed/performed Overall Cognitive Status: Within Functional Limits for tasks assessed                                                Home Living Family/patient expects to be discharged to:: Private residence Living Arrangements: Alone Available Help at Discharge: Family;Available PRN/intermittently Type of Home: House Home Access: Stairs to enter [employer] of Steps: 3 Entrance Stairs-Rails: Right Home Layout: One level     Bathroom Shower/Tub: Tub/shower unit;Curtain   Bathroom  Toilet: Standard     Home Equipment: None          Prior Functioning/Environment Level of Independence: Independent                          OT Goals(Current goals can be found in the care plan section) Acute Rehab OT Goals Patient Stated Goal: to go home OT Goal Formulation: With patient  OT Frequency:      AM-PAC OT "6 Clicks" Daily Activity     Outcome Measure Help from another person eating meals?: None Help from another person taking care of personal grooming?: None Help from another person toileting, which includes using toliet, bedpan, or urinal?: None Help from another person bathing (including washing, rinsing, drying)?: None Help from another person to put on and  taking off regular upper body clothing?: None Help from another person to put on and taking off regular lower body clothing?: None 6 Click Score: 24   End of Session Nurse Communication: Mobility status  Activity Tolerance: Patient tolerated treatment well Patient left: in bed                   Time: 0912-0933 OT Time Calculation (min): 21 min Charges:  OT General Charges $OT Visit: 1 Visit OT Evaluation $OT Eval Low Complexity: 1 Low OT Treatments $Self Care/Home Management : 8-22 mins  Darleen Crocker, MS, OTR/L , CBIS ascom [phone]  03/09/21, 10:22 AM

## 2021-03-09 NOTE — ED Notes (Signed)
Informed RN bed assigned 

## 2021-03-09 NOTE — ED Notes (Signed)
Pt brother at bedside. Pt brother asking why pt is still in ED because pt is sick and needs another room. Asking if pt will be charged the same for admission d/t pt not having bathroom in room

## 2021-03-09 NOTE — Progress Notes (Signed)
*  PRELIMINARY RESULTS* Echocardiogram 2D Echocardiogram has been performed.  Carl Alvarez 03/09/2021, 1:03 PM

## 2021-03-09 NOTE — Progress Notes (Signed)
PT Cancellation Note  Patient Details Name: SHANN MERRICK MRN: [id number] DOB: 1945/12/14   Cancelled Treatment:    Reason Eval/Treat Not Completed: Other (comment). Pt screened, no acute needs noted PT to sign off. Per pt and OT, pt has been up, walking, has no concerns of his gait/balance/strength at this time. Educated on follow up with PCP as needed, pt verbalized understanding.    Lieutenant Diego PT, DPT 10:36 AM,03/09/21

## 2021-03-09 NOTE — Telephone Encounter (Signed)
Permanent atrial fibrillation Normal sinus rhythm will never be restored No cardioversion or antiarrhythmic needed Would confirm NOAC compliance

## 2021-03-09 NOTE — ED Notes (Signed)
Pharmacy contacted for missing meds

## 2021-03-09 NOTE — Progress Notes (Signed)
Patient has arrived on unit. Orders reviewed, Admission assessment complete and documented. Patient A/O x4. Up to chair at this time. Cardiac monitoring attached to verified. Call bell within reach.

## 2021-03-09 NOTE — Plan of Care (Signed)

## 2021-03-09 NOTE — ED Notes (Signed)
Neuro at bedside.

## 2021-03-09 NOTE — Progress Notes (Signed)
SLP Cancellation Note  Patient Details Name: Carl Alvarez MRN: [id number] DOB: [birth year]   Cancelled treatment:       Reason Eval/Treat Not Completed: SLP screened, no needs identified, will sign off (chart reviewed; consulted NSG re: pt's status. Met w/ pt.).  Pt denied any difficulty swallowing and is currently on a regular diet; tolerates swallowing pills w/ water per NSG. Noted pt had several different empty cups in the room he had finished. Pt conversed at conversational level w/out deficits noted; pt and NSG denied any speech-language deficits. He was A/Ox3. Observed pt appropriately answering/talking on the phone before leaving room.  No further skilled ST services indicated as pt appears at his baseline. Pt agreed. NSG to reconsult if any change in status.      Orinda Kenner, MS, CCC-SLP Speech Language Pathologist Rehab Services [phone] [facility] 03/09/2021, 11:40 AM

## 2021-03-09 NOTE — ED Notes (Signed)
Pt alert, talking on cellphone at this time. Denies needs

## 2021-03-09 NOTE — Telephone Encounter (Signed)
Called and informed patient of stroke on MRI after speaking with radiologist, Dr Armandina Gemma.He felt well at that time and was calling family to make them aware.   Radiology tech from [facility] on [address] advised to call 911 and have patient taken to [facility]

## 2021-03-09 NOTE — Progress Notes (Signed)
PROGRESS NOTE  MYCAH FORMICA  DOB: September 28, 1946  PCP: McLean-Scocuzza, Nino Glow, MD VVO:[id number]  DOA: 03/08/2021  LOS: 1 day  Hospital Day: 2   Chief complaint: Fatigue, abnormal MRI finding  Brief narrative: Carl Alvarez is a 75 y.o. male with PMH significant for A. fib with failed cardioversion, currently in A. fib, chronic anticoagulation with Eliquis; hypertension, hyperlipidemia, CAD, history of carotid artery stenosis, COVID infection in February 2021 Patient presented to the ED on 5/17 with complaint of acute onset visual impairment associated with intermittent dizziness, lightheadedness and gait disturbance. 5/17, patient was seen by PCP and obtain an MRI.   MRI of the brain showed 3.2 x 1.1 x 3.3 cm acute/early subacute infarct involving the right corona radiata, right caudate and lentiform nuclei as well as the anterior limb of the right internal capsule. Patient was sent to the ED for further evaluation management. Out of the window for tPA at presentation. Admitted to hospitalist service. Neurology evaluation was obtained.  Subjective: Patient was seen and examined this morning. Pleasant elderly African-American male.  Lying down in bed.  Not in distress.  Denies any weakness.  Assessment/Plan: Acute stroke while on Eliquis -Did not have any focal motor deficit but complained of visual impairment as well as intermittent dizziness lightheadedness and gait disturbance. -MRI brain showed acute/early subacute infarction involving the right corona radiata. -Stroke work-up initiated. -A1c 6.2, HDL 43, LDL 67. -No significant stenosis of the bilateral internal carotid arteries -Echocardiogram showed EF of 55 to 60% with mild LVH, no regional wall motion abnormality. -Per neurology, since patient has a new stroke.  While on anticoagulant, he would need a TEE. -PT/OT eval pending.  Persistent atrial fibrillation -s/p trial of cardioversion in the past.  Patient back in A.  fib.   -On anticoagulation with Eliquis.   -Continue metoprolol and Cardizem.    Suspected obstructive sleep apnea/obesity hypoventilation syndrome -Patient will need to be considered for sleep study as outpatient.  Hypertension -Elevated blood pressure.   -Monitor on Cardizem, metoprolol.  Hydralazine as needed. -Blood pressure mildly elevated.  We will optimize with resumption of BP medication.  Hydralazine as needed.  History of CAD s/p cardiac catheterization in 2019 Hyperlipidemia -Eliquis, Lipitor and Zetia.  Mobility: Encourage ambulation.  Pending PT eval Code Status:   Code Status: Full Code  Nutritional status: Body mass index is 31.38 kg/m.     Diet Order            Diet Heart Room service appropriate? Yes; Fluid consistency: Thin  Diet effective now                DVT prophylaxis:  apixaban (ELIQUIS) tablet 5 mg   Antimicrobials:  None Fluid: None Consultants: Neurology Family Communication:  None at bedside  Status is: Inpatient  Remains inpatient appropriate because: Stroke work-up to be completed  Dispo: The patient is from: Home              Anticipated d/c is to: Pending PT eval              Patient currently is not medically stable to d/c.   Difficult to place patient No     Infusions:    Scheduled Meds: .  stroke: mapping our early stages of recovery book   Does not apply Once  . apixaban  5 mg Oral BID  . aspirin  81 mg Oral Daily  . atorvastatin  80 mg Oral QHS  . diltiazem  120 mg Oral Daily  . ezetimibe  10 mg Oral Daily  . metoprolol tartrate  100 mg Oral BID    Antimicrobials: Anti-infectives (From admission, onward)   None      PRN meds: acetaminophen **OR** acetaminophen (TYLENOL) oral liquid 160 mg/5 mL **OR** acetaminophen, hydrALAZINE, senna-docusate   Objective: Vitals:   03/09/21 1421 03/09/21 1758  BP: 138/80 136/74  Pulse: 61 (!) 55  Resp: 14 15  Temp: 98.4 F (36.9 C) 98.2 F (36.8 C)  SpO2: 99%  99%   No intake or output data in the 24 hours ending 03/09/21 1830 Filed Weights   03/08/21 1720 03/09/21 1421  Weight: 102.1 kg 102.1 kg   Weight change:  Body mass index is 31.38 kg/m.   Physical Exam: General exam: Pleasant, elderly African-American male.  Not in physical distress Skin: No rashes, lesions or ulcers. HEENT: Atraumatic, normocephalic, no obvious bleeding Lungs: Clear to auscultation bilaterally CVS: Regular rhythm, rate controlled, no murmur GI/Abd soft, nontender, nondistended, bowel sound present CNS: Alert, awake, oriented x3 Psychiatry: Mood appropriate Extremities: No pedal edema, no calf tenderness  Data Review: I have personally reviewed the laboratory data and studies available.  Recent Labs  Lab 03/08/21 1256 03/08/21 1736  WBC 7.3 8.6  NEUTROABS 3.9 5.1  HGB 14.3 13.9  HCT 42.1 41.3  MCV 94.7 94.7  PLT 127.0* 143*   Recent Labs  Lab 03/08/21 1256 03/08/21 1829  NA 141 139  K 4.5 3.7  CL 105 104  CO2 31 27  GLUCOSE 88 96  BUN 16 14  CREATININE 0.90 0.95  CALCIUM 9.7 9.1    F/u labs ordered Unresulted Labs (From admission, onward)         None      Signed, Terrilee Croak, MD [facility] 03/09/2021

## 2021-03-09 NOTE — Telephone Encounter (Signed)
Noted both messages cards
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
## 2021-03-09 NOTE — Telephone Encounter (Signed)
He has permanent atrial fibrillation, EKG unchanged at least 5 years or more Recent EKG unchanged Symptoms are likely from something else Not a candidate for antiarrhythmics or ablation as that is not the issue Thx TG

## 2021-03-09 NOTE — ED Notes (Signed)
Pt ambulatory with therapy, NAD noted, steady gait

## 2021-03-09 NOTE — ED Notes (Signed)
Pt eating lunch

## 2021-03-09 NOTE — ED Notes (Signed)
Echo at bedside

## 2021-03-10 ENCOUNTER — Inpatient Hospital Stay (HOSPITAL_COMMUNITY)
Admit: 2021-03-10 | Discharge: 2021-03-10 | Disposition: A | Discharge status: Home / Self Care | Payer: Medicare Other | Attending: Cardiovascular Disease | Admitting: Cardiovascular Disease

## 2021-03-10 ENCOUNTER — Encounter
Admission: EM | Disposition: A | Discharge status: Home / Self Care | Payer: Self-pay | Source: Home / Self Care | Attending: Internal Medicine

## 2021-03-10 DIAGNOSIS — R531 Weakness: Secondary | ICD-10-CM

## 2021-03-10 DIAGNOSIS — I340 Nonrheumatic mitral (valve) insufficiency: Secondary | ICD-10-CM

## 2021-03-10 DIAGNOSIS — I6349 Cerebral infarction due to embolism of other cerebral artery: Secondary | ICD-10-CM

## 2021-03-10 DIAGNOSIS — I4821 Permanent atrial fibrillation: Secondary | ICD-10-CM

## 2021-03-10 DIAGNOSIS — I361 Nonrheumatic tricuspid (valve) insufficiency: Secondary | ICD-10-CM

## 2021-03-10 DIAGNOSIS — H539 Unspecified visual disturbance: Secondary | ICD-10-CM

## 2021-03-10 DIAGNOSIS — I25118 Atherosclerotic heart disease of native coronary artery with other forms of angina pectoris: Secondary | ICD-10-CM

## 2021-03-10 HISTORY — PX: TEE WITHOUT CARDIOVERSION: SHX5443

## 2021-03-10 SURGERY — ECHOCARDIOGRAM, TRANSESOPHAGEAL
Anesthesia: Moderate Sedation

## 2021-03-10 MED ORDER — FENTANYL CITRATE (PF) 100 MCG/2ML IJ SOLN
INTRAMUSCULAR | Status: AC
  Filled 2021-03-10: qty 2

## 2021-03-10 MED ORDER — MIDAZOLAM HCL 5 MG/5ML IJ SOLN
INTRAMUSCULAR | Status: AC
  Filled 2021-03-10: qty 5

## 2021-03-10 MED ORDER — FENTANYL CITRATE (PF) 100 MCG/2ML IJ SOLN
INTRAMUSCULAR | Status: AC | PRN
  Administered 2021-03-10: 25 ug via INTRAVENOUS
  Administered 2021-03-10: 50 ug via INTRAVENOUS

## 2021-03-10 MED ORDER — SODIUM CHLORIDE 0.9 % IV SOLN: INTRAVENOUS | Status: DC

## 2021-03-10 MED ORDER — BUTAMBEN-TETRACAINE-BENZOCAINE 2-2-14 % EX AERO
INHALATION_SPRAY | CUTANEOUS | Status: AC
  Filled 2021-03-10: qty 5

## 2021-03-10 MED ORDER — SODIUM CHLORIDE FLUSH 0.9 % IV SOLN
INTRAVENOUS | Status: AC
  Filled 2021-03-10: qty 10

## 2021-03-10 MED ORDER — LIDOCAINE VISCOUS HCL 2 % MT SOLN
OROMUCOSAL | Status: AC
  Filled 2021-03-10: qty 15

## 2021-03-10 MED ORDER — CLOPIDOGREL BISULFATE 75 MG PO TABS: 75.0000 mg | ORAL_TABLET | Freq: Every day | ORAL | 2 refills | Status: DC

## 2021-03-10 MED ORDER — DIAZEPAM 5 MG/ML IJ SOLN
INTRAMUSCULAR | Status: AC | PRN
  Administered 2021-03-10: 2 mg via INTRAVENOUS
  Administered 2021-03-10: 1 mg via INTRAVENOUS

## 2021-03-10 MED ORDER — DOXYCYCLINE HYCLATE 100 MG PO TABS
100.0000 mg | ORAL_TABLET | Freq: Two times a day (BID) | ORAL | Status: DC
  Administered 2021-03-10: 10:00:00 100 mg via ORAL
  Filled 2021-03-10: qty 1

## 2021-03-10 NOTE — Discharge Summary (Signed)
Physician Discharge Summary  Carl Alvarez:[id number] DOB: [birth year] DOA: 03/08/2021  PCP: McLean-Scocuzza, Nino Glow, MD  Admit date: 03/08/2021 Discharge date: 03/10/2021  Admitted From: Home Discharge disposition: Home   Code Status: Full Code  Diet Recommendation: Cardiac diet  Discharge Diagnosis:   Active Problems:   CVA (cerebrovascular accident) [facility])  Chief complaint: Fatigue, abnormal MRI finding  Brief narrative: Carl Alvarez is a 75 y.o. male with PMH significant for A. fib with failed cardioversion, currently in A. fib, chronic anticoagulation with Eliquis; hypertension, hyperlipidemia, CAD, history of carotid artery stenosis, COVID infection in February 2021 Patient presented to the ED on 5/17 with complaint of acute onset visual impairment associated with intermittent dizziness, lightheadedness and gait disturbance. 5/17, patient was seen by PCP and obtain an MRI.   MRI of the brain showed 3.2 x 1.1 x 3.3 cm acute/early subacute infarct involving the right corona radiata, right caudate and lentiform nuclei as well as the anterior limb of the right internal capsule. Patient was sent to the ED for further evaluation management. Out of the window for tPA at presentation. Admitted to hospitalist service. Neurology evaluation was obtained.  Subjective: Patient was seen and examined this morning. Lying on bed.  Not in distress.  No new symptoms.  Denies any weakness. Underwent TEE by cardiology today.  Unremarkable.  Hospital course: Acute stroke while on Eliquis -Did not have any focal motor deficit but complained of visual impairment as well as intermittent dizziness lightheadedness and gait disturbance. -MRI brain showed acute/early subacute infarction involving the right corona radiata. -Stroke work-up initiated. -A1c 6.2, HDL 43, LDL 67. -No significant stenosis of the bilateral internal carotid arteries -Echocardiogram showed EF of 55 to 60% with mild  LVH, no regional wall motion abnormality. -Per neurology, since patient has a new stroke while on anticoagulant.  He underwent TEE today.  Unremarkable except for some atherosclerosis in the aortic arc.  Per recommendation from neurology and cardiology, will discharge the patient home on Plavix and Eliquis. -PT/OT eval obtained.  No outpatient follow-up required.  Persistent atrial fibrillation -s/p trial of cardioversion in the past.  Patient back in A. fib.   -On anticoagulation with Eliquis.   -Continue metoprolol and Cardizem.   -Long-term anticoagulation with Eliquis to continue.  Suspected obstructive sleep apnea/obesity hypoventilation syndrome -Patient will need to be considered for sleep study as outpatient.  Hypertension -Blood pressure is controlled on Cardizem, metoprolol and chlorthalidone.  History of CAD s/p cardiac catheterization in 2019 Hyperlipidemia -Eliquis, Lipitor and Zetia.   Wound care:    Discharge Exam:   Vitals:   03/10/21 1250 03/10/21 1300 03/10/21 1315 03/10/21 1330  BP: 113/77 115/76 102/70 (!) 109/50  Pulse: 73 70 65 78  Resp: 16 10 14 11   Temp:      TempSrc:      SpO2: 95% 94% 95%   Weight:      Height:        Body mass index is 31.38 kg/m.  General exam: Pleasant, elderly African-American male. Skin: No rashes, lesions or ulcers. HEENT: Atraumatic, normocephalic, no obvious bleeding Lungs: Clear to auscultation bilaterally CVS: Regular rate and rhythm, no murmur GI/Abd soft, nontender, nondistended, bowel sound present CNS: Alert, awake, oriented x3 Psychiatry: Mood appropriate Extremities: No pedal edema, no calf tenderness  Follow ups:   Discharge Instructions    Diet - low sodium heart healthy   Complete by: As directed    Increase activity slowly   Complete by:  As directed       Follow-up Information    McLean-Scocuzza, Nino Glow, MD Follow up.   Specialty: Internal Medicine Contact information: [place] [address] [phone]        Minna Merritts, MD .   Specialty: Cardiology Contact information: [place] [address] [phone]               Recommendations for Outpatient Follow-Up:   1. Follow-up with PCP as an outpatient  Discharge Instructions:  Follow with Primary MD McLean-Scocuzza, Nino Glow, MD in 7 days   Get CBC/BMP checked in next visit within 1 week by PCP or SNF MD ( we routinely change or add medications that can affect your baseline labs and fluid status, therefore we recommend that you get the mentioned basic workup next visit with your PCP, your PCP may decide not to get them or add new tests based on their clinical decision)  On your next visit with your PCP, please Get Medicines reviewed and adjusted.  Please request your PCP  to go over all Hospital Tests and Procedure/Radiological results at the follow up, please get all Hospital records sent to your Prim MD by signing hospital release before you go home.  Activity: As tolerated with Full fall precautions use walker/cane & assistance as needed  For Heart failure patients - Check your Weight same time everyday, if you gain over 2 pounds, or you develop in leg swelling, experience more shortness of breath or chest pain, call your Primary MD immediately. Follow Cardiac Low Salt Diet and 1.5 lit/day fluid restriction.  If you have smoked or chewed Tobacco in the last 2 yrs please stop smoking, stop any regular Alcohol  and or any Recreational drug use.  If you experience worsening of your admission symptoms, develop shortness of breath, life threatening emergency, suicidal or homicidal thoughts you must seek medical attention immediately by calling 911 or calling your MD immediately  if symptoms less severe.  You Must read complete instructions/literature along with all the possible adverse reactions/side effects for all the Medicines you take and that have been  prescribed to you. Take any new Medicines after you have completely understood and accpet all the possible adverse reactions/side effects.   Do not drive, operate heavy machinery, perform activities at heights, swimming or participation in water activities or provide baby sitting services if your were admitted for syncope or siezures until you have seen by Primary MD or a Neurologist and advised to do so again.  Do not drive when taking Pain medications.  Do not take more than prescribed Pain, Sleep and Anxiety Medications  Wear Seat belts while driving.   Please note You were cared for by a hospitalist during your hospital stay. If you have any questions about your discharge medications or the care you received while you were in the hospital after you are discharged, you can call the unit and asked to speak with the hospitalist on call if the hospitalist that took care of you is not available. Once you are discharged, your primary care physician will handle any further medical issues. Please note that NO REFILLS for any discharge medications will be authorized once you are discharged, as it is imperative that you return to your primary care physician (or establish a relationship with a primary care physician if you do not have one) for your aftercare needs so that they can reassess your need for medications and monitor  your lab values.    Allergies as of 03/10/2021      Reactions   Penicillins Other (See Comments)   Pt states reaction is where he feels like he is "out of his head" Has patient had a PCN reaction causing immediate rash, facial/tongue/throat swelling, SOB or lightheadedness with hypotension: No Has patient had a PCN reaction causing severe rash involving mucus membranes or skin necrosis: No Has patient had a PCN reaction that required hospitalization: No Has patient had a PCN reaction occurring within the last 10 years: No If all of the above answers are "NO", then may proceed  with Cephalosporin       Medication List    TAKE these medications   apixaban 5 MG Tabs tablet Commonly known as: Eliquis Take 1 tablet (5 mg total) by mouth 2 (two) times daily.   atorvastatin 80 MG tablet Commonly known as: LIPITOR TAKE 1 TABLET BY MOUTH EVERY DAY   chlorthalidone 25 MG tablet Commonly known as: HYGROTON Take 0.5 tablets (12.5 mg total) by mouth daily as needed.   clopidogrel 75 MG tablet Commonly known as: Plavix Take 1 tablet (75 mg total) by mouth daily.   diltiazem 120 MG 24 hr capsule Commonly known as: CARDIZEM CD TAKE 1 CAPSULE BY MOUTH EVERY DAY   doxycycline 100 MG tablet Commonly known as: VIBRA-TABS Take 1 tablet (100 mg total) by mouth 2 (two) times daily.   ezetimibe 10 MG tablet Commonly known as: ZETIA Take 1 tablet (10 mg total) by mouth daily.   metoprolol tartrate 100 MG tablet Commonly known as: LOPRESSOR Take 1 tablet (100 mg total) by mouth 2 (two) times daily.   multivitamin tablet Take 1 tablet by mouth daily.       Time coordinating discharge: 35 minutes  The results of significant diagnostics from this hospitalization (including imaging, microbiology, ancillary and laboratory) are listed below for reference.    Procedures and Diagnostic Studies:   No results found.   Labs:   Basic Metabolic Panel: Recent Labs  Lab 03/08/21 1256 03/08/21 1829  NA 141 139  K 4.5 3.7  CL 105 104  CO2 31 27  GLUCOSE 88 96  BUN 16 14  CREATININE 0.90 0.95  CALCIUM 9.7 9.1   GFR Estimated Creatinine Clearance: 81.7 mL/min (by C-G formula based on SCr of 0.95 mg/dL). Liver Function Tests: Recent Labs  Lab 03/08/21 1256 03/08/21 1829  AST 19 24  ALT 22 24  ALKPHOS 94 85  BILITOT 0.8 1.0  PROT 7.1 7.0  ALBUMIN 4.3 3.8   No results for input(s): LIPASE, AMYLASE in the last 168 hours. No results for input(s): AMMONIA in the last 168 hours. Coagulation profile Recent Labs  Lab 03/08/21 1736  INR 1.2     CBC: Recent Labs  Lab 03/08/21 1256 03/08/21 1736  WBC 7.3 8.6  NEUTROABS 3.9 5.1  HGB 14.3 13.9  HCT 42.1 41.3  MCV 94.7 94.7  PLT 127.0* 143*   Cardiac Enzymes: No results for input(s): CKTOTAL, CKMB, CKMBINDEX, TROPONINI in the last 168 hours. BNP: Invalid input(s): POCBNP CBG: No results for input(s): GLUCAP in the last 168 hours. D-Dimer No results for input(s): DDIMER in the last 72 hours. Hgb A1c Recent Labs    03/08/21 1256 03/09/21 0434  HGBA1C 6.2 6.2*   Lipid Profile Recent Labs    03/08/21 1256 03/09/21 0434  CHOL 136 125  HDL 45.90 43  LDLCALC 73 67  TRIG 85.0 74  CHOLHDL 3  2.9   Thyroid function studies Recent Labs    03/08/21 1256  TSH 0.93   Anemia work up No results for input(s): VITAMINB12, FOLATE, FERRITIN, TIBC, IRON, RETICCTPCT in the last 72 hours. Microbiology Recent Results (from the past 240 hour(s))  SARS CORONAVIRUS 2 (TAT 6-24 HRS) Nasopharyngeal Nasopharyngeal Swab     Status: None   Collection Time: 03/08/21  6:28 PM   Specimen: Nasopharyngeal Swab  Result Value Ref Range Status   SARS Coronavirus 2 NEGATIVE NEGATIVE Final    Comment: (NOTE) SARS-CoV-2 target nucleic acids are NOT DETECTED.  The SARS-CoV-2 RNA is generally detectable in upper and lower respiratory specimens during the acute phase of infection. Negative results do not preclude SARS-CoV-2 infection, do not rule out co-infections with other pathogens, and should not be used as the sole basis for treatment or other patient management decisions. Negative results must be combined with clinical observations, patient history, and epidemiological information. The expected result is Negative.  Fact Sheet for Patients: SugarRoll.be  Fact Sheet for Healthcare Providers: https://www.woods-mathews.com/  This test is not yet approved or cleared by the [place] FDA and  has been authorized for detection and/or  diagnosis of SARS-CoV-2 by FDA under an Emergency Use Authorization (EUA). This EUA will remain  in effect (meaning this test can be used) for the duration of the COVID-19 declaration under Se ction 564(b)(1) of the Act, 21 U.S.C. section 360bbb-3(b)(1), unless the authorization is terminated or revoked sooner.  Performed at [facility], [place] [address] [place], [place] [address]      Signed: Terrilee Croak  [facility] 03/10/2021, 2:33 PM

## 2021-03-10 NOTE — Progress Notes (Signed)
Transesophageal echo results discussed with patient's family and with hospitalist service Etiology of stroke unclear, could be secondary to aortic atherosclerosis, Notable plaque in the arch Unable to exclude small Lambl's excrescences seen on aortic valve Would initiate Plavix with Eliquis 5 twice daily  Signed, Esmond Plants, MD, Ph.D [facility]

## 2021-03-10 NOTE — Progress Notes (Signed)
*  PRELIMINARY RESULTS* Echocardiogram Echocardiogram Transesophageal has been performed.  Carl Alvarez 03/10/2021, 1:01 PM

## 2021-03-10 NOTE — Progress Notes (Signed)
Transesophageal Echocardiogram :  Indication: CVA Requesting/ordering  physician:   Procedure: Benzocaine spray x2 and 2 mls x 2 of viscous lidocaine were given orally to provide local anesthesia to the oropharynx. The patient was positioned supine on the left side, bite block provided. The patient was moderately sedated with the doses of versed and fentanyl as detailed below.  Using digital technique an omniplane probe was advanced into the distal esophagus without incident.   Moderate sedation: 1. Sedation used:  Versed: 3 mg, Fentanyl: 75 2. Time administered: 12:15 PM    time when patient started recovery: 12:45 PM Total sedation time 30 minutes 3. I was face to face during this time  See report in EPIC  for complete details: In brief,  transgastric imaging revealed normal LV function with no RWMAs and no mural apical thrombus.  .  Estimated ejection fraction was 55%.  Right sided cardiac chambers were normal with no evidence of pulmonary hypertension.  Imaging of the septum showed no ASD or VSD Bubble study was negative for shunt 2D and color flow confirmed no PFO  Aortic valve examined closely, unable to exclude small  Lambl's excrescences   The LA was well visualized in orthogonal views.  There was no spontaneous contrast and no thrombus in the LA and LA appendage   The descending thoracic aorta had mild diffuse aortic debris with no evidence of aneurysmal dilation or disection Aortic arch with mild to moderate aortic atherosclerosis.    Ida Rogue 03/10/2021 3:51 PM

## 2021-03-10 NOTE — TOC Progression Note (Signed)
Transition of Care [facility]) - Progression Note    Patient Details  Name: Carl Alvarez MRN: [id number] Date of Birth: Apr 12, 1946  Transition of Care [facility]) CM/SW [place], RN Phone Number: 03/10/2021, 2:29 PM  Clinical Narrative:   Patient lives at home by himself, however his daughter is in healthcare, and he states that she looks in on him and can assist.  No concerns about getting medication or getting to appointments.  Declines any TOC needs at this time.    Expected Discharge Plan: Home/Self Care    Expected Discharge Plan and Services Expected Discharge Plan: Home/Self Care                                               Social Determinants of Health (SDOH) Interventions    Readmission Risk Interventions No flowsheet data found.
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
## 2021-03-11 ENCOUNTER — Encounter: Payer: Self-pay | Admitting: Cardiovascular Disease
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
## 2021-03-14 ENCOUNTER — Ambulatory Visit (INDEPENDENT_AMBULATORY_CARE_PROVIDER_SITE_OTHER): Payer: Medicare Other | Admitting: Physician Assistant

## 2021-03-14 ENCOUNTER — Other Ambulatory Visit: Payer: Self-pay

## 2021-03-14 ENCOUNTER — Encounter: Payer: Self-pay | Admitting: Physician Assistant

## 2021-03-14 VITALS — BP 118/72 | HR 74 | Ht 71.0 in | Wt 218.0 lb

## 2021-03-14 DIAGNOSIS — I10 Essential (primary) hypertension: Secondary | ICD-10-CM | POA: Diagnosis not present

## 2021-03-14 DIAGNOSIS — E785 Hyperlipidemia, unspecified: Secondary | ICD-10-CM

## 2021-03-14 DIAGNOSIS — I4821 Permanent atrial fibrillation: Secondary | ICD-10-CM

## 2021-03-14 DIAGNOSIS — I2510 Atherosclerotic heart disease of native coronary artery without angina pectoris: Secondary | ICD-10-CM

## 2021-03-14 DIAGNOSIS — G4730 Sleep apnea, unspecified: Secondary | ICD-10-CM

## 2021-03-14 DIAGNOSIS — I639 Cerebral infarction, unspecified: Secondary | ICD-10-CM

## 2021-03-14 NOTE — Patient Instructions (Signed)
Medication Instructions:  No changes at this time.   *If you need a refill on your cardiac medications before your next appointment, please call your pharmacy*   Lab Work: None  If you have labs (blood work) drawn today and your tests are completely normal, you will receive your results only by: [name] MyChart Message (if you have MyChart) OR . A paper copy in the mail If you have any lab test that is abnormal or we need to change your treatment, we will call you to review the results.   Testing/Procedures: None   Follow-Up: At [facility], you and your health needs are our priority.  As part of our continuing mission to provide you with exceptional heart care, we have created designated Provider Care Teams.  These Care Teams include your primary Cardiologist (physician) and Advanced Practice Providers (APPs -  Physician Assistants and Nurse Practitioners) who all work together to provide you with the care you need, when you need it.  We recommend signing up for the patient portal called "MyChart".  Sign up information is provided on this After Visit Summary.  MyChart is used to connect with patients for Virtual Visits (Telemedicine).  Patients are able to view lab/test results, encounter notes, upcoming appointments, etc.  Non-urgent messages can be sent to your provider as well.   To learn more about what you can do with MyChart, go to [email or web address].    Your next appointment:   3 month(s)  The format for your next appointment:   In Person  Provider:   You may see Ida Rogue, MD or one of the following Advanced Practice Providers on your designated Care Team:    Murray Hodgkins, NP  Christell Faith, PA-C  Marrianne Mood, PA-C  [facility], [place]  Laurann Montana, NP

## 2021-03-15 ENCOUNTER — Telehealth: Payer: Self-pay | Admitting: Internal Medicine

## 2021-03-15 NOTE — Telephone Encounter (Signed)
Disregard pt is scheduled on 03/16/2021 08:45 am. thanks

## 2021-03-15 NOTE — Telephone Encounter (Signed)
Rejection Reason - Patient did not respond" Catilin Rehm said on Mar 14, 2021 2:03 PM  "Nurse LM. Pt never responded. " Catilin Rehm said on Mar 14, 2021 2:03 PM  "NURSE LM" Neta Ehlers said on Mar 09, 2021 8:07 AM  msg from [facility]

## 2021-03-18 ENCOUNTER — Other Ambulatory Visit: Payer: Self-pay

## 2021-03-18 ENCOUNTER — Encounter: Payer: Self-pay | Admitting: Internal Medicine

## 2021-03-18 ENCOUNTER — Telehealth: Payer: Self-pay | Admitting: Internal Medicine

## 2021-03-18 ENCOUNTER — Ambulatory Visit (INDEPENDENT_AMBULATORY_CARE_PROVIDER_SITE_OTHER): Payer: Medicare Other | Admitting: Internal Medicine

## 2021-03-18 VITALS — BP 112/74 | HR 53 | Temp 98.3°F | Ht 71.0 in | Wt 222.6 lb

## 2021-03-18 DIAGNOSIS — D72820 Lymphocytosis (symptomatic): Secondary | ICD-10-CM | POA: Diagnosis not present

## 2021-03-18 DIAGNOSIS — D696 Thrombocytopenia, unspecified: Secondary | ICD-10-CM | POA: Diagnosis not present

## 2021-03-18 DIAGNOSIS — R5383 Other fatigue: Secondary | ICD-10-CM | POA: Diagnosis not present

## 2021-03-18 DIAGNOSIS — I2510 Atherosclerotic heart disease of native coronary artery without angina pectoris: Secondary | ICD-10-CM | POA: Diagnosis not present

## 2021-03-18 DIAGNOSIS — R519 Headache, unspecified: Secondary | ICD-10-CM

## 2021-03-18 DIAGNOSIS — I639 Cerebral infarction, unspecified: Secondary | ICD-10-CM

## 2021-03-18 DIAGNOSIS — R7303 Prediabetes: Secondary | ICD-10-CM | POA: Diagnosis not present

## 2021-03-18 DIAGNOSIS — J0130 Acute sphenoidal sinusitis, unspecified: Secondary | ICD-10-CM | POA: Diagnosis not present

## 2021-03-18 DIAGNOSIS — R0683 Snoring: Secondary | ICD-10-CM

## 2021-03-18 DIAGNOSIS — Z8673 Personal history of transient ischemic attack (TIA), and cerebral infarction without residual deficits: Secondary | ICD-10-CM | POA: Diagnosis not present

## 2021-03-18 DIAGNOSIS — D581 Hereditary elliptocytosis: Secondary | ICD-10-CM

## 2021-03-18 LAB — CBC WITH DIFFERENTIAL/PLATELET
Absolute Monocytes: 623 cells/uL (ref 200–950)
Basophils Absolute: 36 cells/uL (ref 0–200)
Basophils Relative: 0.4 %
Eosinophils Absolute: 231 cells/uL (ref 15–500)
Eosinophils Relative: 2.6 %
HCT: 43.5 % (ref 38.5–50.0)
Hemoglobin: 14.1 g/dL (ref 13.2–17.1)
Lymphs Abs: 3302 cells/uL (ref 850–3900)
MCH: 30.6 pg (ref 27.0–33.0)
MCHC: 32.4 g/dL (ref 32.0–36.0)
MCV: 94.4 fL (ref 80.0–100.0)
MPV: 10.7 fL (ref 7.5–12.5)
Monocytes Relative: 7 %
Neutro Abs: 4708 cells/uL (ref 1500–7800)
Neutrophils Relative %: 52.9 %
Platelets: 166 10*3/uL (ref 140–400)
RBC: 4.61 10*6/uL (ref 4.20–5.80)
RDW: 12.3 % (ref 11.0–15.0)
Total Lymphocyte: 37.1 %
WBC: 8.9 10*3/uL (ref 3.8–10.8)
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
## 2021-03-18 NOTE — Telephone Encounter (Signed)
Error  Dr. Kelly Services

## 2021-03-18 NOTE — Patient Instructions (Addendum)
Zyrtec Nasal saline  flonase or nasacort    Education officer, [employer] Fax E-mail Address  [phone] [email or web address] [address]   [address]   [place] [address]     Specialties       Dr. Leonie Man Neurology above     MD No Physician    Sleep study doctors below    Primary Contact Information  Phone Fax E-mail Address  [phone] Not available [place]   [place] [address]     Sinusitis, Adult Sinusitis is soreness and swelling (inflammation) of your sinuses. Sinuses are hollow spaces in the bones around your face. They are located:  Around your eyes.  In the middle of your forehead.  Behind your nose.  In your cheekbones. Your sinuses and nasal passages are lined with a fluid called mucus. Mucus drains out of your sinuses. Swelling can trap mucus in your sinuses. This lets germs (bacteria, virus, or fungus) grow, which leads to infection. Most of the time, this condition is caused by a virus. What are the causes? This condition is caused by:  Allergies.  Asthma.  Germs.  Things that block your nose or sinuses.  Growths in the nose (nasal polyps).  Chemicals or irritants in the air.  Fungus (rare). What increases the risk? You are more likely to develop this condition if:  You have a weak body defense system (immune system).  You do a lot of swimming or diving.  You use nasal sprays too much.  You smoke. What are the signs or symptoms? The main symptoms of this condition are pain and a feeling of pressure around the sinuses. Other symptoms include:  Stuffy nose (congestion).  Runny nose (drainage).  Swelling and warmth in the sinuses.  Headache.  Toothache.  A cough that may get worse at night.  Mucus that collects in the throat or the back of the nose (postnasal drip).  Being unable to smell and taste.  Being very tired (fatigue).  A fever.  Sore  throat.  Bad breath. How is this diagnosed? This condition is diagnosed based on:  Your symptoms.  Your medical history.  A physical exam.  Tests to find out if your condition is short-term (acute) or long-term (chronic). Your doctor may: ? Check your nose for growths (polyps). ? Check your sinuses using a tool that has a light (endoscope). ? Check for allergies or germs. ? Do imaging tests, such as an MRI or CT scan. How is this treated? Treatment for this condition depends on the cause and whether it is short-term or long-term.  If caused by a virus, your symptoms should go away on their own within 10 days. You may be given medicines to relieve symptoms. They include: ? Medicines that shrink swollen tissue in the nose. ? Medicines that treat allergies (antihistamines). ? A spray that treats swelling of the nostrils. ? Rinses that help get rid of thick mucus in your nose (nasal saline washes).  If caused by bacteria, your doctor may wait to see if you will get better without treatment. You may be given antibiotic medicine if you have: ? A very bad infection. ? A weak body defense system.  If caused by growths in the nose, you may need to have surgery. Follow these instructions at home: Medicines  Take, use, or apply over-the-counter and prescription medicines only as told by your doctor. These may include nasal sprays.  If you were  prescribed an antibiotic medicine, take it as told by your doctor. Do not stop taking the antibiotic even if you start to feel better. Hydrate and humidify  Drink enough water to keep your pee (urine) pale yellow.  Use a cool mist humidifier to keep the humidity level in your home above 50%.  Breathe in steam for 10-15 minutes, 3-4 times a day, or as told by your doctor. You can do this in the bathroom while a hot shower is running.  Try not to spend time in cool or dry air.   Rest  Rest as much as you can.  Sleep with your head raised  (elevated).  Make sure you get enough sleep each night. General instructions  Put a warm, moist washcloth on your face 3-4 times a day, or as often as told by your doctor. This will help with discomfort.  Wash your hands often with soap and water. If there is no soap and water, use hand sanitizer.  Do not smoke. Avoid being around people who are smoking (secondhand smoke).  Keep all follow-up visits as told by your doctor. This is important.   Contact a doctor if:  You have a fever.  Your symptoms get worse.  Your symptoms do not get better within 10 days. Get help right away if:  You have a very bad headache.  You cannot stop throwing up (vomiting).  You have very bad pain or swelling around your face or eyes.  You have trouble seeing.  You feel confused.  Your neck is stiff.  You have trouble breathing. Summary  Sinusitis is swelling of your sinuses. Sinuses are hollow spaces in the bones around your face.  This condition is caused by tissues in your nose that become inflamed or swollen. This traps germs. These can lead to infection.  If you were prescribed an antibiotic medicine, take it as told by your doctor. Do not stop taking it even if you start to feel better.  Keep all follow-up visits as told by your doctor. This is important. This information is not intended to replace advice given to you by your health care provider. Make sure you discuss any questions you have with your health care provider. Document Revised: 03/11/2018 Document Reviewed: 03/11/2018 Elsevier Patient Education  [place].  Preventing Cerebrovascular Disease Cerebrovascular disease affects the arteries that supply the brain. Any condition that blocks or disrupts blood flow to the brain can cause cerebrovascular disease. When brain cells lose blood supply, they start to die within minutes, which results in a stroke. Stroke is the main danger of cerebrovascular disease. Stroke is a  leading cause of death and disability. Many conditions can cause cerebrovascular disease and stroke. Making diet and lifestyle changes to prevent these conditions lowers your risk of cerebrovascular disease. How can making changes to prevent cerebrovascular disease affect me? Making these changes lowers your risk of many conditions that can cause cerebrovascular disease and stroke, including:  Atherosclerosis. This is narrowing and hardening of an artery, which happens when fat, cholesterol, calcium, or other substances (plaque) build up inside an artery. Plaque reduces blood flow through the artery.  High blood pressure. This increases the risk of building up plaque in your arteries or bleeding inside the brain, which can lead to stroke. By making these changes, you can also improve your overall health and quality of life. What can increase my risk of developing cerebrovascular disease? Certain factors make you more likely to develop cerebrovascular disease. Some  of these factors are things that you cannot control, including:  Being over 18 years of age.  Being male. Strokes happen more often in women.  Family history. Having a parent or sibling who had a stroke before age 30 increases your risk of a stroke.  Having certain health conditions, such as: ? Diabetes. ? High blood pressure. ? Heart disease. ? Atherosclerosis. ? High cholesterol. ? Sleep apnea. ? Sickle cell disease.  Having a personal history of transient ischemic attack or a prior stroke. Other risk factors are things that you can control, including:  Being overweight.  Using tobacco products.  Not being physically active.  Eating a high-fat diet.  Alcohol or drug abuse. Talk with your health care provider about your risk for cerebrovascular disease. Work with your health care provider to manage any diseases that you have that may contribute to cerebrovascular disease. Your health care provider may prescribe  medicines to help prevent major causes of cerebrovascular disease. What actions can I take to prevent this? General instructions  If directed by your health care provider, check your blood pressure regularly. Track your blood pressure and report the readings to your health care provider.  If you are overweight, ask your health care provider to recommend a weight-loss plan for you. Losing 5-10 lb (2.2-4.5 kg) can reduce your risk of diabetes, atherosclerosis, and high blood pressure. Nutrition  Eat more fruits, vegetables, and whole grains.  Reduce how much saturated fat you eat. To do this, eat less red meat and fewer full-fat dairy products.  Eat healthy proteins instead of red meat. Healthy proteins include: ? Fish. Eat fish that contains heart-healthy omega-3 fatty acids. Try to eat these twice a week. Examples include salmon, albacore tuna, mackerel, and herring. ? Chicken and [place]. ? Nuts and seeds. ? Low-fat or nonfat yogurt.  Avoid precooked or cured meat, such as bacon, sausages, and meat loaves.  Avoid foods that contain: ? A lot of sugar, such as sweets and drinks with added sugar. ? A lot of salt (sodium). Avoid adding extra salt to your food, as told by your health care provider. ? Trans fats (trans-fatty acids), such as margarine and fats in baked goods. Trans fats may be listed as "partially hydrogenated oils" on food labels.  Check food labels to see how much sodium, sugar, and trans fats are in foods.  Use vegetable oils that contain low amounts of saturated fat, such as olive oil or canola oil.   Lifestyle  Exercise for 30?60 minutes on most days, or as much as told by your health care provider. Do moderate-intensity exercise, such as brisk walking, bicycling, and water aerobics. Ask your health care provider which activities are safe for you.  Do not use any products that contain nicotine or tobacco, such as cigarettes, e-cigarettes, and chewing tobacco. If you  need help quitting, ask your health care provider.  Do not drink alcohol if: ? Your health care provider tells you not to drink. ? You are pregnant, may be pregnant, or are planning to become pregnant.  If you drink alcohol: ? Limit how much you use to:  0-1 drink a day for women.  0-2 drinks a day for men. ? Be aware of how much alcohol is in your drink. In the [place] one drink equals one 12 oz bottle of beer (355 mL), one 5 oz glass of wine (148 mL), or one 1 oz glass of hard liquor (44 mL). Where to find more information Learn  more about preventing cerebrovascular disease from:  [place], Lung, and [place]: https://wilson-eaton.com/  Centers for Disease Control and Prevention: [place].si  American Stroke Association: www.stroke.org Contact a health care provider if: You develop any of the following symptoms:  Headaches that keep coming back (chronic headaches).  Nausea.  Vision problems.  Increased sensitivity to noise or light.  Depression or mood swings.  Anxiety or irritability.  Memory problems.  Difficulty concentrating or paying attention.  Sleep problems.  Feeling tired all the time. Get help right away if you:  Have a partial or total loss of consciousness.  Are taking blood thinners and you fall or you experience a minor injury to the head.  Have a bleeding disorder and you fall or you experience minor trauma to the head.  Have any symptoms of a stroke. "BE FAST" is an easy way to remember the main warning signs of a stroke: ? B - Balance. Signs are dizziness, sudden trouble walking, or loss of balance. ? E - Eyes. Signs are trouble seeing or a sudden change in vision. ? F - Face. Signs are sudden weakness or numbness of the face, or the face or eyelid drooping on one side. ? A - Arms. Signs are weakness or numbness in an arm. This happens suddenly and usually on one side of the body. ? S - Speech. Signs are sudden trouble speaking,  slurred speech, or trouble understanding what people say. ? T - Time. Time to call emergency services. Write down what time symptoms started.  Have other signs of a stroke, such as: ? A sudden, severe headache with no known cause. ? Nausea or vomiting. ? Seizure. These symptoms may represent a serious problem that is an emergency. Do not wait to see if the symptoms will go away. Get medical help right away. Call your local emergency services (911 in the [place] Do not drive yourself to the hospital. Summary  Cerebrovascular disease is a condition that affects the arteries that supply the brain. Stroke is the main danger of cerebrovascular disease.  Common causes of cerebrovascular disease include atherosclerosis and high blood pressure.  Making diet and lifestyle changes can reduce your risk of cerebrovascular disease.  Work with your health care provider to manage any diseases that you have that may contribute to cerebrovascular disease. This information is not intended to replace advice given to you by your health care provider. Make sure you discuss any questions you have with your health care provider. Document Revised: 09/23/2019 Document Reviewed: 09/23/2019 Elsevier Patient Education  2021 [place] After a Stroke After a stroke, you may have various problems with thinking (cognitive disability). The types of problems you have will depend on how severe the stroke was and where it was located in the brain. Problems may include:  Problems with short-term memory.  Trouble paying attention.  Trouble communicating or understanding language (aphasia).  A drop in mental ability that may interfere with daily life (dementia).  Trouble with problem-solving and information processing.  Problems with reading, writing, or math.  Problems with your ability to plan and to perform activities in sequence (executive function). These problems can feel  overwhelming. However, with rehabilitation and time to heal, many people have improvement in their symptoms. What causes cognitive disability? A stroke happens when blood cannot flow to certain areas of the brain. When this happens, brain cells die in the affected areas because they cannot get oxygen and nutrients from the blood. Cognitive disability  is caused by the death of cells in the areas of the brain that control thinking. What is cognitive rehabilitation? Cognitive rehabilitation is a program to help you improve your thinking skills after a stroke. Rehabilitation cannot completely reverse the effects of a stroke, but it can help you with memory, problem-solving, and communication skills. Therapy focuses on:  Improving brain function. This may involve activities such as learning to break down tasks into simple steps.  Helping you learn ways to cope with thinking problems. For example, you might learn memory tricks or do activities that stimulate memory, such as naming objects or describing pictures. Cognitive rehabilitation may include:  Speech-language therapy to help you understand and use language to communicate.  Occupational therapy to help you perform daily activities.  Music therapy to help relieve stress, anxiety, and depression. This may involve listening to music, singing, or playing instruments.  Physical therapy to help improve your ability to move and perform actions that involve the muscles (motor functions).   When will therapy start and where will I have therapy? Your health care provider will decide when it is best for you to start therapy. In some cases, people start rehabilitation as soon as their health is stable, which may be 24-48 hours after the stroke. Rehabilitation can take place in a few different places, based on your needs. It may take place in:  The hospital or an in-patient rehabilitation hospital.  An outpatient rehabilitation facility.  A long-term  care facility.  A community rehabilitation clinic.  Your home. What are some tools to help after a stroke? There are a number of tools and apps that you can use on your smartphone, personal computer, or tablet to help improve brain function. Some of these apps include:  Calendar reminders or alarm apps to help with memory.  Note-taking or sketch pad apps to help with memory or communication.  Text-to-speech apps that allow you to listen to what you are reading, which helps your ability to understanding text.  Picture dictionary or picture message apps to help with communication.  E-readers. These can highlight text as it is read aloud, which helps with listening and reading skills. How can my friends or family help during my rehabilitation? During your recovery, it is important that your friends and family members help you work toward more independence. Your caregivers should speak with your health care providers to learn how they can best help you during recovery. This may include working on speech-language or memory exercises at home, or helping with daily tasks and errands. If you have cognitive disability, you may be at risk for injury or accidents at home, such as forgetting to turn off the stove. Friends and family members can help ensure home safety by taking steps such as getting appliances with automatic shut-off features or storing dangerous objects in a secure place. What else should I know about cognitive rehabilitation after a stroke? Having trouble with memory and problem-solving can make you feel alone. You may also have mood changes, anxiety, or depression after a stroke. It is important to:  Stay connected with others through social groups, online support groups, or your community.  Talk to your friends, family, and caregivers about any emotional problems you are having.  Go to one-on-one or group therapy as suggested by your health care provider.  Stay physically active  and exercise as often as suggested by your health care provider. Summary  After a stroke, some people have problems with thinking that affect attention,  memory, language, communication, and problem-solving.  Cognitive rehabilitation is a program to help you regain brain function and learn skills to cope with thinking problems.  Rehabilitation cannot completely reverse the effects of a stroke, but it can help to improve quality of life.  Cognitive rehabilitation may include speech-language therapy, occupational therapy, music therapy, and physical therapy. This information is not intended to replace advice given to you by your health care provider. Make sure you discuss any questions you have with your health care provider. Document Revised: 01/29/2019 Document Reviewed: 01/12/2017 Elsevier Patient Education  2021 [place].   Stroke Prevention Some medical conditions and behaviors are associated with a higher chance of having a stroke. You can help prevent a stroke by making nutrition, lifestyle, and other changes, including managing any medical conditions you may have. What nutrition changes can be made?  Eat healthy foods. You can do this by: ? Choosing foods high in fiber, such as fresh fruits and vegetables and whole grains. ? Eating at least 5 or more servings of fruits and vegetables a day. Try to fill half of your plate at each meal with fruits and vegetables. ? Choosing lean protein foods, such as lean cuts of meat, poultry without skin, fish, tofu, beans, and nuts. ? Eating low-fat dairy products. ? Avoiding foods that are high in salt (sodium). This can help lower blood pressure. ? Avoiding foods that have saturated fat, trans fat, and cholesterol. This can help prevent high cholesterol. ? Avoiding processed and premade foods.  Follow your health care provider's specific guidelines for losing weight, controlling high blood pressure (hypertension), lowering high cholesterol,  and managing diabetes. These may include: ? Reducing your daily calorie intake. ? Limiting your daily sodium intake to 1,500 milligrams (mg). ? Using only healthy fats for cooking, such as olive oil, canola oil, or sunflower oil. ? Counting your daily carbohydrate intake.   What lifestyle changes can be made?  Maintain a healthy weight. Talk to your health care provider about your ideal weight.  Get at least 30 minutes of moderate physical activity at least 5 days a week. Moderate activity includes brisk walking, biking, and swimming.  Do not use any products that contain nicotine or tobacco, such as cigarettes and e-cigarettes. If you need help quitting, ask your health care provider. It may also be helpful to avoid exposure to secondhand smoke.  Limit alcohol intake to no more than 1 drink a day for nonpregnant women and 2 drinks a day for men. One drink equals 12 oz of beer, 5 oz of wine, or 1 oz of hard liquor.  Stop any illegal drug use.  Avoid taking birth control pills. Talk to your health care provider about the risks of taking birth control pills if: ? You are over 72 years old. ? You smoke. ? You get migraines. ? You have ever had a blood clot. What other changes can be made?  Manage your cholesterol levels. ? Eating a healthy diet is important for preventing high cholesterol. If cholesterol cannot be managed through diet alone, you may also need to take medicines. ? Take any prescribed medicines to control your cholesterol as told by your health care provider.  Manage your diabetes. ? Eating a healthy diet and exercising regularly are important parts of managing your blood sugar. If your blood sugar cannot be managed through diet and exercise, you may need to take medicines. ? Take any prescribed medicines to control your diabetes as told by your health  care provider.  Control your hypertension. ? To reduce your risk of stroke, try to keep your blood pressure below  130/80. ? Eating a healthy diet and exercising regularly are an important part of controlling your blood pressure. If your blood pressure cannot be managed through diet and exercise, you may need to take medicines. ? Take any prescribed medicines to control hypertension as told by your health care provider. ? Ask your health care provider if you should monitor your blood pressure at home. ? Have your blood pressure checked every year, even if your blood pressure is normal. Blood pressure increases with age and some medical conditions.  Get evaluated for sleep disorders (sleep apnea). Talk to your health care provider about getting a sleep evaluation if you snore a lot or have excessive sleepiness.  Take over-the-counter and prescription medicines only as told by your health care provider. Aspirin or blood thinners (antiplatelets or anticoagulants) may be recommended to reduce your risk of forming blood clots that can lead to stroke.  Make sure that any other medical conditions you have, such as atrial fibrillation or atherosclerosis, are managed. What are the warning signs of a stroke? The warning signs of a stroke can be easily remembered as BEFAST.  B is for balance. Signs include: ? Dizziness. ? Loss of balance or coordination. ? Sudden trouble walking.  E is for eyes. Signs include: ? A sudden change in vision. ? Trouble seeing.  F is for face. Signs include: ? Sudden weakness or numbness of the face. ? The face or eyelid drooping to one side.  A is for arms. Signs include: ? Sudden weakness or numbness of the arm, usually on one side of the body.  S is for speech. Signs include: ? Trouble speaking (aphasia). ? Trouble understanding.  T is for time. ? These symptoms may represent a serious problem that is an emergency. Do not wait to see if the symptoms will go away. Get medical help right away. Call your local emergency services (911 in the [place] Do not drive yourself to the  hospital.  Other signs of stroke may include: ? A sudden, severe headache with no known cause. ? Nausea or vomiting. ? Seizure. Where to find more information For more information, visit:  American Stroke Association: www.strokeassociation.org  National Stroke Association: www.stroke.org Summary  You can prevent a stroke by eating healthy, exercising, not smoking, limiting alcohol intake, and managing any medical conditions you may have.  Do not use any products that contain nicotine or tobacco, such as cigarettes and e-cigarettes. If you need help quitting, ask your health care provider. It may also be helpful to avoid exposure to secondhand smoke.  Remember BEFAST for warning signs of stroke. Get help right away if you or a loved one has any of these signs. This information is not intended to replace advice given to you by your health care provider. Make sure you discuss any questions you have with your health care provider. Document Revised: 09/21/2017 Document Reviewed: 11/14/2016 Elsevier Patient Education  2021 [employer].

## 2021-03-22 LAB — PATHOLOGIST SMEAR REVIEW

## 2021-03-23 ENCOUNTER — Ambulatory Visit: Payer: Medicare Other | Admitting: Internal Medicine

## 2021-03-23 ENCOUNTER — Telehealth: Payer: Self-pay

## 2021-03-23 NOTE — Telephone Encounter (Signed)
Faxed prescriber response form back to [facility]. Pt sees [facility]

## 2021-03-25 NOTE — Addendum Note (Signed)
Addended by: Orland Mustard on: 03/25/2021 05:06 PM   Modules accepted: Orders

## 2021-03-26 ENCOUNTER — Other Ambulatory Visit: Payer: Self-pay | Admitting: Cardiovascular Disease

## 2021-03-28 NOTE — Telephone Encounter (Signed)
59m 101kg, scr 0.95 5/17 22, lovw/dunn 03/14/21

## 2021-03-28 NOTE — Telephone Encounter (Signed)
Please review for refill, Thanks !  

## 2021-03-31 ENCOUNTER — Inpatient Hospital Stay: Payer: Medicare Other

## 2021-03-31 ENCOUNTER — Other Ambulatory Visit: Payer: Self-pay

## 2021-03-31 ENCOUNTER — Inpatient Hospital Stay: Payer: Medicare Other | Attending: Oncology | Admitting: Nurse Practitioner

## 2021-03-31 ENCOUNTER — Encounter: Payer: Self-pay | Admitting: Nurse Practitioner

## 2021-03-31 VITALS — BP 153/99 | HR 94 | Temp 99.1°F | Resp 18 | Wt 223.0 lb

## 2021-03-31 DIAGNOSIS — I4892 Unspecified atrial flutter: Secondary | ICD-10-CM | POA: Insufficient documentation

## 2021-03-31 DIAGNOSIS — Z833 Family history of diabetes mellitus: Secondary | ICD-10-CM | POA: Diagnosis not present

## 2021-03-31 DIAGNOSIS — I4891 Unspecified atrial fibrillation: Secondary | ICD-10-CM | POA: Diagnosis not present

## 2021-03-31 DIAGNOSIS — Z8249 Family history of ischemic heart disease and other diseases of the circulatory system: Secondary | ICD-10-CM | POA: Insufficient documentation

## 2021-03-31 DIAGNOSIS — I2510 Atherosclerotic heart disease of native coronary artery without angina pectoris: Secondary | ICD-10-CM | POA: Insufficient documentation

## 2021-03-31 DIAGNOSIS — Z880 Allergy status to penicillin: Secondary | ICD-10-CM | POA: Insufficient documentation

## 2021-03-31 DIAGNOSIS — D696 Thrombocytopenia, unspecified: Secondary | ICD-10-CM

## 2021-03-31 DIAGNOSIS — I119 Hypertensive heart disease without heart failure: Secondary | ICD-10-CM | POA: Diagnosis not present

## 2021-03-31 DIAGNOSIS — I081 Rheumatic disorders of both mitral and tricuspid valves: Secondary | ICD-10-CM | POA: Diagnosis not present

## 2021-03-31 DIAGNOSIS — I6782 Cerebral ischemia: Secondary | ICD-10-CM | POA: Diagnosis not present

## 2021-03-31 DIAGNOSIS — Z8042 Family history of malignant neoplasm of prostate: Secondary | ICD-10-CM | POA: Insufficient documentation

## 2021-03-31 DIAGNOSIS — Z8616 Personal history of COVID-19: Secondary | ICD-10-CM | POA: Insufficient documentation

## 2021-03-31 DIAGNOSIS — Z79899 Other long term (current) drug therapy: Secondary | ICD-10-CM | POA: Insufficient documentation

## 2021-03-31 DIAGNOSIS — Z8349 Family history of other endocrine, nutritional and metabolic diseases: Secondary | ICD-10-CM | POA: Insufficient documentation

## 2021-03-31 DIAGNOSIS — Z7902 Long term (current) use of antithrombotics/antiplatelets: Secondary | ICD-10-CM | POA: Insufficient documentation

## 2021-03-31 DIAGNOSIS — Z808 Family history of malignant neoplasm of other organs or systems: Secondary | ICD-10-CM | POA: Insufficient documentation

## 2021-03-31 DIAGNOSIS — Z87891 Personal history of nicotine dependence: Secondary | ICD-10-CM | POA: Diagnosis not present

## 2021-03-31 DIAGNOSIS — Z7901 Long term (current) use of anticoagulants: Secondary | ICD-10-CM | POA: Diagnosis not present

## 2021-03-31 DIAGNOSIS — Z8673 Personal history of transient ischemic attack (TIA), and cerebral infarction without residual deficits: Secondary | ICD-10-CM | POA: Diagnosis not present

## 2021-03-31 DIAGNOSIS — D581 Hereditary elliptocytosis: Secondary | ICD-10-CM | POA: Diagnosis not present

## 2021-03-31 DIAGNOSIS — Z8719 Personal history of other diseases of the digestive system: Secondary | ICD-10-CM | POA: Insufficient documentation

## 2021-03-31 LAB — CBC WITH DIFFERENTIAL/PLATELET
Abs Immature Granulocytes: 0.01 10*3/uL (ref 0.00–0.07)
Basophils Absolute: 0 10*3/uL (ref 0.0–0.1)
Basophils Relative: 0 %
Eosinophils Absolute: 0.2 10*3/uL (ref 0.0–0.5)
Eosinophils Relative: 3 %
HCT: 39.2 % (ref 39.0–52.0)
Hemoglobin: 13.2 g/dL (ref 13.0–17.0)
Immature Granulocytes: 0 %
Lymphocytes Relative: 27 %
Lymphs Abs: 2 10*3/uL (ref 0.7–4.0)
MCH: 31.7 pg (ref 26.0–34.0)
MCHC: 33.7 g/dL (ref 30.0–36.0)
MCV: 94.2 fL (ref 80.0–100.0)
Monocytes Absolute: 0.5 10*3/uL (ref 0.1–1.0)
Monocytes Relative: 7 %
Neutro Abs: 4.6 10*3/uL (ref 1.7–7.7)
Neutrophils Relative %: 63 %
Platelets: 141 10*3/uL — ABNORMAL LOW (ref 150–400)
RBC: 4.16 MIL/uL — ABNORMAL LOW (ref 4.22–5.81)
RDW: 12.9 % (ref 11.5–15.5)
WBC: 7.4 10*3/uL (ref 4.0–10.5)
nRBC: 0 % (ref 0.0–0.2)

## 2021-03-31 LAB — TECHNOLOGIST SMEAR REVIEW
Plt Morphology: NORMAL
RBC MORPHOLOGY: NORMAL
WBC MORPHOLOGY: NORMAL

## 2021-03-31 LAB — COMPREHENSIVE METABOLIC PANEL
ALT: 26 U/L (ref 0–44)
AST: 21 U/L (ref 15–41)
Albumin: 3.9 g/dL (ref 3.5–5.0)
Alkaline Phosphatase: 83 U/L (ref 38–126)
Anion gap: 9 (ref 5–15)
BUN: 14 mg/dL (ref 8–23)
CO2: 29 mmol/L (ref 22–32)
Calcium: 8.9 mg/dL (ref 8.9–10.3)
Chloride: 103 mmol/L (ref 98–111)
Creatinine, Ser: 1.06 mg/dL (ref 0.61–1.24)
GFR, Estimated: >60 mL/min (ref >60)
Glucose, Bld: 163 mg/dL — ABNORMAL HIGH (ref 70–99)
Potassium: 3.4 mmol/L — ABNORMAL LOW (ref 3.5–5.1)
Sodium: 141 mmol/L (ref 135–145)
Total Bilirubin: 0.7 mg/dL (ref 0.3–1.2)
Total Protein: 7.5 g/dL (ref 6.5–8.1)

## 2021-03-31 LAB — RETICULOCYTES
Immature Retic Fract: 10.9 % (ref 2.3–15.9)
RBC.: 4.13 MIL/uL — ABNORMAL LOW (ref 4.22–5.81)
Retic Count, Absolute: 77.6 10*3/uL (ref 19.0–186.0)
Retic Ct Pct: 1.9 % (ref 0.4–3.1)

## 2021-03-31 LAB — IRON AND TIBC
Iron: 55 ug/dL (ref 45–182)
Saturation Ratios: 19 % (ref 17.9–39.5)
TIBC: 284 ug/dL (ref 250–450)
UIBC: 229 ug/dL

## 2021-03-31 LAB — HIV ANTIBODY (ROUTINE TESTING W REFLEX): HIV Screen 4th Generation wRfx: NONREACTIVE

## 2021-03-31 LAB — HEPATITIS C ANTIBODY: HCV Ab: NONREACTIVE

## 2021-03-31 LAB — LACTATE DEHYDROGENASE: LDH: 132 U/L (ref 98–192)

## 2021-03-31 LAB — FERRITIN: Ferritin: 223 ng/mL (ref 24–336)
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
## 2021-03-31 NOTE — Progress Notes (Signed)
Patient here for oncology follow-up appointment, expresses concerns of constipation, dizziness and fatigue

## 2021-04-01 ENCOUNTER — Other Ambulatory Visit: Payer: Medicare Other

## 2021-04-01 ENCOUNTER — Encounter: Payer: Medicare Other | Admitting: Oncology

## 2021-04-01 LAB — HAPTOGLOBIN: Haptoglobin: 84 mg/dL (ref 34–355)

## 2021-04-05 ENCOUNTER — Telehealth: Payer: Self-pay | Admitting: Cardiovascular Disease

## 2021-04-05 ENCOUNTER — Other Ambulatory Visit: Payer: Self-pay | Admitting: Physician Assistant

## 2021-04-05 NOTE — Telephone Encounter (Signed)
Refill Request.  

## 2021-04-05 NOTE — Telephone Encounter (Signed)
Pt last saw Christell Faith, PA on 03/14/21, last labs 03/31/21 Creat 1.06, age 75, weight 101.2kg, based on specified criteria pt is on appropriate dosage of Eliquis 92m BID.  Will refill rx.  Per RMerrily Brittle PA note on 03/14/21, pt is to be on both Plavix and Eliquis (note exert copied below)  Continue recently added Plavix in addition to Eliquis as outlined below.  In an effort to avoid significant bleeding risk triple therapy has been deferred, per primary cardiologist

## 2021-04-05 NOTE — Telephone Encounter (Signed)
Daughter Adonis Brook states Pharmacy is requesting a call to clarify patients dose of both Eliquis & Plavix

## 2021-04-05 NOTE — Telephone Encounter (Signed)
Spoke with Thurmond Butts at [facility] and he wanted to confirm if patient was on both Eliquis and Plavix. Reviewed that patient is in fact on both and he was appreciative for the review and confirmation. He verbalized understanding with no further questions.

## 2021-04-07 ENCOUNTER — Ambulatory Visit: Payer: Medicare Other | Admitting: Neurology

## 2021-04-07 ENCOUNTER — Encounter: Payer: Self-pay | Admitting: Neurology

## 2021-04-28 ENCOUNTER — Telehealth: Payer: Self-pay | Admitting: Cardiovascular Disease

## 2021-04-28 MED ORDER — CLOPIDOGREL BISULFATE 75 MG PO TABS: 75.0000 mg | ORAL_TABLET | Freq: Every day | ORAL | 0 refills | Status: DC

## 2021-04-28 MED ORDER — APIXABAN 5 MG PO TABS: 5.0000 mg | ORAL_TABLET | Freq: Two times a day (BID) | ORAL | 0 refills | Status: DC

## 2021-04-28 NOTE — Telephone Encounter (Signed)
*  STAT* If patient is at the pharmacy, call can be transferred to refill team.   1. Which medications need to be refilled? (please list name of each medication and dose if known) Plavix 75 mg po q d   2. Which pharmacy/location (including street and city if local pharmacy) is medication to be sent to?  TBD patient traveling to [facility] and will be there to locate a pharmacy this afternoon  3. Do they need a 30 day or 90 day supply? 90     Patient calling to discuss medication . He wants to confirm he is to continue taking Plavix and Eliquis after recent hospital visit because his bottle states no refills.    Patient mentioned he has already taken plavix for today but since he is now out should he double is eliquis dose.    Patient advised changes should alsways be discussed with provider / nurse and someone will call today to advise .   Patient states he will stop at a pharmacy upon arrival in [place] this afternoon to obtain a refill if needed.

## 2021-04-28 NOTE — Telephone Encounter (Signed)
Spoke with patient and he is traveling to [place]. He wanted to know if he should continue both Eliquis and Plavix. Reviewed that these medications are important to reduce risk of stroke. Advised I could send in [facility]ill [facility] local [facility] and that when he arrives at his location they can transfer prescription to that location. He verbalized understanding of our conversation, agreement with plan, with no further questions.   Sending to primary cardiologist for recommendations on continuation of both Eliquis and Plavix with duration of how long to remain on both.

## 2021-05-02 NOTE — Telephone Encounter (Signed)
Able to reach out to Carl Alvarez, he had spoken with Carl Patella, Rn last week regarding his medication Eliquis and Plavix (refer to earlier phone encounters). Carl Alvarez was routed message and his response was for pt   "Agree with Eliquis and Plavix given recent stroke "  Carl Alvarez verbalized understanding, reports he has been taken both as directed and will continue to take both, Plavix daily and Eliquis BID. Advised will send in additional orders for future refills at his next appt with Carl Alvarez on 06/15/2021, as Pam sent in 90-day supply refill on 04/28/2021.  Carl Alvarez thankful for phone call, nothing further at this time.

## 2021-05-24 ENCOUNTER — Institutional Professional Consult (permissible substitution): Payer: Medicare Other | Admitting: Internal Medicine

## 2021-06-09 ENCOUNTER — Other Ambulatory Visit: Payer: Self-pay | Admitting: Physician Assistant
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
## 2021-06-15 ENCOUNTER — Ambulatory Visit: Payer: Medicare Other | Admitting: Cardiovascular Disease

## 2021-06-15 DIAGNOSIS — I639 Cerebral infarction, unspecified: Secondary | ICD-10-CM

## 2021-06-15 DIAGNOSIS — E785 Hyperlipidemia, unspecified: Secondary | ICD-10-CM

## 2021-06-15 DIAGNOSIS — I4821 Permanent atrial fibrillation: Secondary | ICD-10-CM

## 2021-06-15 DIAGNOSIS — I2510 Atherosclerotic heart disease of native coronary artery without angina pectoris: Secondary | ICD-10-CM

## 2021-06-15 DIAGNOSIS — I4892 Unspecified atrial flutter: Secondary | ICD-10-CM

## 2021-06-15 DIAGNOSIS — Z87891 Personal history of nicotine dependence: Secondary | ICD-10-CM

## 2021-06-15 DIAGNOSIS — I10 Essential (primary) hypertension: Secondary | ICD-10-CM

## 2021-06-15 DIAGNOSIS — Z7901 Long term (current) use of anticoagulants: Secondary | ICD-10-CM

## 2021-06-16 ENCOUNTER — Encounter: Payer: Self-pay | Admitting: Cardiovascular Disease

## 2021-07-04 ENCOUNTER — Inpatient Hospital Stay: Payer: Medicare Other | Attending: Oncology | Admitting: Nurse Practitioner

## 2021-07-04 ENCOUNTER — Inpatient Hospital Stay: Payer: Medicare Other

## 2021-07-04 VITALS — BP 152/93 | HR 66 | Temp 98.1°F | Resp 16 | Wt 217.0 lb

## 2021-07-04 DIAGNOSIS — Z87891 Personal history of nicotine dependence: Secondary | ICD-10-CM | POA: Insufficient documentation

## 2021-07-04 DIAGNOSIS — D696 Thrombocytopenia, unspecified: Secondary | ICD-10-CM

## 2021-07-04 DIAGNOSIS — Z7902 Long term (current) use of antithrombotics/antiplatelets: Secondary | ICD-10-CM | POA: Diagnosis not present

## 2021-07-04 DIAGNOSIS — Z8673 Personal history of transient ischemic attack (TIA), and cerebral infarction without residual deficits: Secondary | ICD-10-CM | POA: Insufficient documentation

## 2021-07-04 DIAGNOSIS — Z7901 Long term (current) use of anticoagulants: Secondary | ICD-10-CM | POA: Insufficient documentation

## 2021-07-04 DIAGNOSIS — D581 Hereditary elliptocytosis: Secondary | ICD-10-CM | POA: Diagnosis not present

## 2021-07-04 LAB — CBC WITH DIFFERENTIAL/PLATELET
Abs Immature Granulocytes: 0.02 10*3/uL (ref 0.00–0.07)
Basophils Absolute: 0 10*3/uL (ref 0.0–0.1)
Basophils Relative: 0 %
Eosinophils Absolute: 0.2 10*3/uL (ref 0.0–0.5)
Eosinophils Relative: 2 %
HCT: 42.9 % (ref 39.0–52.0)
Hemoglobin: 14.3 g/dL (ref 13.0–17.0)
Immature Granulocytes: 0 %
Lymphocytes Relative: 30 %
Lymphs Abs: 2.4 10*3/uL (ref 0.7–4.0)
MCH: 30.9 pg (ref 26.0–34.0)
MCHC: 33.3 g/dL (ref 30.0–36.0)
MCV: 92.7 fL (ref 80.0–100.0)
Monocytes Absolute: 0.6 10*3/uL (ref 0.1–1.0)
Monocytes Relative: 8 %
Neutro Abs: 4.7 10*3/uL (ref 1.7–7.7)
Neutrophils Relative %: 60 %
Platelets: 137 10*3/uL — ABNORMAL LOW (ref 150–400)
RBC: 4.63 MIL/uL (ref 4.22–5.81)
RDW: 12.6 % (ref 11.5–15.5)
WBC: 7.9 10*3/uL (ref 4.0–10.5)
nRBC: 0 % (ref 0.0–0.2)
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
## 2021-07-09 ENCOUNTER — Other Ambulatory Visit: Payer: Self-pay | Admitting: Physician Assistant

## 2021-07-11 ENCOUNTER — Ambulatory Visit: Payer: Medicare Other | Admitting: Neurology

## 2021-07-12 ENCOUNTER — Encounter: Payer: Self-pay | Admitting: Cardiovascular Disease

## 2021-07-12 ENCOUNTER — Telehealth: Payer: Self-pay

## 2021-07-12 ENCOUNTER — Ambulatory Visit
Admission: RE | Admit: 2021-07-12 | Discharge: 2021-07-12 | Disposition: A | Discharge status: Home / Self Care | Payer: Medicare Other | Source: Ambulatory Visit | Attending: Nurse Practitioner | Admitting: Nurse Practitioner

## 2021-07-12 ENCOUNTER — Other Ambulatory Visit: Payer: Self-pay

## 2021-07-12 ENCOUNTER — Ambulatory Visit (INDEPENDENT_AMBULATORY_CARE_PROVIDER_SITE_OTHER): Payer: Medicare Other | Admitting: Cardiovascular Disease

## 2021-07-12 DIAGNOSIS — I4821 Permanent atrial fibrillation: Secondary | ICD-10-CM

## 2021-07-12 DIAGNOSIS — D696 Thrombocytopenia, unspecified: Secondary | ICD-10-CM | POA: Insufficient documentation

## 2021-07-12 DIAGNOSIS — I4892 Unspecified atrial flutter: Secondary | ICD-10-CM | POA: Diagnosis not present

## 2021-07-12 MED ORDER — CLOPIDOGREL BISULFATE 75 MG PO TABS: 75.0000 mg | ORAL_TABLET | Freq: Every day | ORAL | 3 refills | Status: DC

## 2021-07-12 MED ORDER — EZETIMIBE 10 MG PO TABS
10.0000 mg | ORAL_TABLET | Freq: Every day | ORAL | 3 refills | Status: DC
Start: 2021-07-12 — End: 2022-08-07

## 2021-07-12 MED ORDER — METOPROLOL TARTRATE 100 MG PO TABS
100.0000 mg | ORAL_TABLET | Freq: Two times a day (BID) | ORAL | 3 refills | Status: DC
Start: 2021-07-12 — End: 2022-06-16

## 2021-07-12 MED ORDER — APIXABAN 5 MG PO TABS: 5.0000 mg | ORAL_TABLET | Freq: Two times a day (BID) | ORAL | 1 refills | Status: DC

## 2021-07-12 MED ORDER — ATORVASTATIN CALCIUM 80 MG PO TABS: 80.0000 mg | ORAL_TABLET | Freq: Every day | ORAL | 3 refills | Status: DC

## 2021-07-12 MED ORDER — DILTIAZEM HCL ER COATED BEADS 120 MG PO CP24: ORAL_CAPSULE | ORAL | 3 refills | Status: DC

## 2021-07-12 NOTE — Telephone Encounter (Signed)
Called and lmomed pt refill approved sent to pharmacy on file since the pharmacy wasn't indacted In the original message. I advised pt if I sent to the wrong one to call and I can change it.  Prescription refill request for Eliquis received. Indication:afib Last office visit:[facility] 07/12/21 Scr:1.06 6/922 Age: 40mWeight:99.8kg
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
## 2021-07-12 NOTE — Telephone Encounter (Signed)
-----   Message from Emily Filbert, RN sent at 07/12/2021  5:19 PM EDT ----- Patient seen in clinic on 9/20 with Dr. Rockey Situ- requesting eliquis refill

## 2021-07-12 NOTE — Patient Instructions (Signed)
Medication Instructions:  No changes  If you need a refill on your cardiac medications before your next appointment, please call your pharmacy.    Lab work: No new labs needed   If you have labs (blood work) drawn today and your tests are completely normal, you will receive your results only by: [place] (if you have MyChart) OR A paper copy in the mail If you have any lab test that is abnormal or we need to change your treatment, we will call you to review the results.   Testing/Procedures: No new testing needed   Follow-Up: At [facility], you and your health needs are our priority.  As part of our continuing mission to provide you with exceptional heart care, we have created designated Provider Care Teams.  These Care Teams include your primary Cardiologist (physician) and Advanced Practice Providers (APPs -  Physician Assistants and Nurse Practitioners) who all work together to provide you with the care you need, when you need it.  You will need a follow up appointment in 12 months  Providers on your designated Care Team:   Murray Hodgkins, NP Christell Faith, PA-C Marrianne Mood, PA-C Cadence Kathlen Mody, [place]  Any Other Special Instructions Will Be Listed Below (If Applicable).  COVID-19 Vaccine Information can be found at: ShippingScam.co.uk For questions related to vaccine distribution or appointments, please email [email or web address] or call [phone].

## 2021-07-18 ENCOUNTER — Telehealth: Payer: Self-pay | Admitting: *Deleted

## 2021-07-18 NOTE — Telephone Encounter (Signed)
-----   Message from [place], [place] sent at 07/12/2021  5:31 PM EDT ----- Regarding: eliquis Called and lmomed pt refill approved sent to pharmacy on file since the pharmacy wasn't indacted In the original message. I advised pt if I sent to the wrong one to call and I can change it.  Prescription refill request for Eliquis received. Indication:afib Last office visit:gollan 07/12/21 Scr:1.06 6/922 Age: 39mWeight:99.8kg  ----- Message ----- From: MEmily Filbert RN Sent: 07/12/2021   5:20 PM EDT To: CDaleen BoAnticoag  Patient seen in clinic on 9/20 with Dr. GRockey Situ requesting eliquis refill

## 2021-09-12 ENCOUNTER — Other Ambulatory Visit: Payer: Self-pay

## 2021-09-12 ENCOUNTER — Ambulatory Visit (INDEPENDENT_AMBULATORY_CARE_PROVIDER_SITE_OTHER): Payer: Medicare Other

## 2021-09-12 DIAGNOSIS — Z23 Encounter for immunization: Secondary | ICD-10-CM

## 2021-09-28 ENCOUNTER — Inpatient Hospital Stay: Payer: Medicare Other | Attending: Oncology

## 2021-09-28 ENCOUNTER — Other Ambulatory Visit: Payer: Self-pay | Admitting: Oncology

## 2021-09-28 ENCOUNTER — Inpatient Hospital Stay: Payer: Medicare Other | Admitting: Oncology

## 2021-09-28 DIAGNOSIS — D581 Hereditary elliptocytosis: Secondary | ICD-10-CM

## 2021-10-05 ENCOUNTER — Other Ambulatory Visit: Payer: Self-pay | Admitting: Physician Assistant

## 2021-11-02 ENCOUNTER — Ambulatory Visit (INDEPENDENT_AMBULATORY_CARE_PROVIDER_SITE_OTHER): Payer: Medicare HMO

## 2021-11-02 VITALS — Ht 71.0 in | Wt 220.0 lb

## 2021-11-02 DIAGNOSIS — Z0000 Encounter for general adult medical examination without abnormal findings: Secondary | ICD-10-CM | POA: Diagnosis not present

## 2021-11-02 NOTE — Patient Instructions (Addendum)
[name] , Thank you for taking time to come for your Medicare Wellness Visit. I appreciate your ongoing commitment to your health goals. Please review the following plan we discussed and let me know if I can assist you in the future.   These are the goals we discussed:  Goals      Follow up with Primary Care Provider     As needed        This is a list of the screening recommended for you and due dates:  Health Maintenance  Topic Date Due   COVID-19 Vaccine (5 - Booster for Pfizer series) 11/18/2021*   Zoster (Shingles) Vaccine (1 of 2) 01/31/2022*   Colon Cancer Screening  07/08/2023   Tetanus Vaccine  04/17/2028   Pneumonia Vaccine  Completed   Flu Shot  Completed   Hepatitis C Screening: USPSTF Recommendation to screen - Ages 18-79 yo.  Completed   HPV Vaccine  Aged Out  *Topic was postponed. The date shown is not the original due date.    Advanced directives: not yet completed  Conditions/risks identified: none new  Follow up in one year for your annual wellness visit.   Preventive Care 65 Years and Older, Male Preventive care refers to lifestyle choices and visits with your health care provider that can promote health and wellness. What does preventive care include? A yearly physical exam. This is also called an annual well check. Dental exams once or twice a year. Routine eye exams. Ask your health care provider how often you should have your eyes checked. Personal lifestyle choices, including: Daily care of your teeth and gums. Regular physical activity. Eating a healthy diet. Avoiding tobacco and drug use. Limiting alcohol use. Practicing safe sex. Taking low doses of aspirin every day. Taking vitamin and mineral supplements as recommended by your health care provider. What happens during an annual well check? The services and screenings done by your health care provider during your annual well check will depend on your age, overall health, lifestyle risk  factors, and family history of disease. Counseling  Your health care provider may ask you questions about your: Alcohol use. Tobacco use. Drug use. Emotional well-being. Home and relationship well-being. Sexual activity. Eating habits. History of falls. Memory and ability to understand (cognition). Work and work Statistician. Screening  You may have the following tests or measurements: Height, weight, and BMI. Blood pressure. Lipid and cholesterol levels. These may be checked every 5 years, or more frequently if you are over 23 years old. Skin check. Lung cancer screening. You may have this screening every year starting at age 44 if you have a 30-pack-year history of smoking and currently smoke or have quit within the past 15 years. Fecal occult blood test (FOBT) of the stool. You may have this test every year starting at age 46. Flexible sigmoidoscopy or colonoscopy. You may have a sigmoidoscopy every 5 years or a colonoscopy every 10 years starting at age 40. Prostate cancer screening. Recommendations will vary depending on your family history and other risks. Hepatitis C blood test. Hepatitis B blood test. Sexually transmitted disease (STD) testing. Diabetes screening. This is done by checking your blood sugar (glucose) after you have not eaten for a while (fasting). You may have this done every 1-3 years. Abdominal aortic aneurysm (AAA) screening. You may need this if you are a current or former smoker. Osteoporosis. You may be screened starting at age 22 if you are at high risk. Talk with your health  care provider about your test results, treatment options, and if necessary, the need for more tests. Vaccines  Your health care provider may recommend certain vaccines, such as: Influenza vaccine. This is recommended every year. Tetanus, diphtheria, and acellular pertussis (Tdap, Td) vaccine. You may need a Td booster every 10 years. Zoster vaccine. You may need this after age  15. Pneumococcal 13-valent conjugate (PCV13) vaccine. One dose is recommended after age 75. Pneumococcal polysaccharide (PPSV23) vaccine. One dose is recommended after age 5. Talk to your health care provider about which screenings and vaccines you need and how often you need them. This information is not intended to replace advice given to you by your health care provider. Make sure you discuss any questions you have with your health care provider. Document Released: 11/05/2015 Document Revised: 06/28/2016 Document Reviewed: 08/10/2015 Elsevier Interactive Patient Education  2017 [place] Prevention in the Home Falls can cause injuries. They can happen to people of all ages. There are many things you can do to make your home safe and to help prevent falls. What can I do on the outside of my home? Regularly fix the edges of walkways and driveways and fix any cracks. Remove anything that might make you trip as you walk through a door, such as a raised step or threshold. Trim any bushes or trees on the path to your home. Use bright outdoor lighting. Clear any walking paths of anything that might make someone trip, such as rocks or tools. Regularly check to see if handrails are loose or broken. Make sure that both sides of any steps have handrails. Any raised decks and porches should have guardrails on the edges. Have any leaves, snow, or ice cleared regularly. Use sand or salt on walking paths during winter. Clean up any spills in your garage right away. This includes oil or grease spills. What can I do in the bathroom? Use night lights. Install grab bars by the toilet and in the tub and shower. Do not use towel bars as grab bars. Use non-skid mats or decals in the tub or shower. If you need to sit down in the shower, use a plastic, non-slip stool. Keep the floor dry. Clean up any water that spills on the floor as soon as it happens. Remove soap buildup in the tub or shower  regularly. Attach bath mats securely with double-sided non-slip rug tape. Do not have throw rugs and other things on the floor that can make you trip. What can I do in the bedroom? Use night lights. Make sure that you have a light by your bed that is easy to reach. Do not use any sheets or blankets that are too big for your bed. They should not hang down onto the floor. Have a firm chair that has side arms. You can use this for support while you get dressed. Do not have throw rugs and other things on the floor that can make you trip. What can I do in the kitchen? Clean up any spills right away. Avoid walking on wet floors. Keep items that you use a lot in easy-to-reach places. If you need to reach something above you, use a strong step stool that has a grab bar. Keep electrical cords out of the way. Do not use floor polish or wax that makes floors slippery. If you must use wax, use non-skid floor wax. Do not have throw rugs and other things on the floor that can make you trip. What can  I do with my stairs? Do not leave any items on the stairs. Make sure that there are handrails on both sides of the stairs and use them. Fix handrails that are broken or loose. Make sure that handrails are as long as the stairways. Check any carpeting to make sure that it is firmly attached to the stairs. Fix any carpet that is loose or worn. Avoid having throw rugs at the top or bottom of the stairs. If you do have throw rugs, attach them to the floor with carpet tape. Make sure that you have a light switch at the top of the stairs and the bottom of the stairs. If you do not have them, ask someone to add them for you. What else can I do to help prevent falls? Wear shoes that: Do not have high heels. Have rubber bottoms. Are comfortable and fit you well. Are closed at the toe. Do not wear sandals. If you use a stepladder: Make sure that it is fully opened. Do not climb a closed stepladder. Make sure that  both sides of the stepladder are locked into place. Ask someone to hold it for you, if possible. Clearly mark and make sure that you can see: Any grab bars or handrails. First and last steps. Where the edge of each step is. Use tools that help you move around (mobility aids) if they are needed. These include: Canes. Walkers. Scooters. Crutches. Turn on the lights when you go into a dark area. Replace any light bulbs as soon as they burn out. Set up your furniture so you have a clear path. Avoid moving your furniture around. If any of your floors are uneven, fix them. If there are any pets around you, be aware of where they are. Review your medicines with your doctor. Some medicines can make you feel dizzy. This can increase your chance of falling. Ask your doctor what other things that you can do to help prevent falls. This information is not intended to replace advice given to you by your health care provider. Make sure you discuss any questions you have with your health care provider. Document Released: 08/05/2009 Document Revised: 03/16/2016 Document Reviewed: 11/13/2014 Elsevier Interactive Patient Education  2017 [employer].
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
## 2021-11-18 DIAGNOSIS — H1132 Conjunctival hemorrhage, left eye: Secondary | ICD-10-CM | POA: Diagnosis not present

## 2022-03-16 ENCOUNTER — Telehealth: Payer: Self-pay

## 2022-03-16 NOTE — Telephone Encounter (Signed)
LMTCB. Pt is due for his yearly follow up. Please schedule pt when he returns the call.

## 2022-06-16 ENCOUNTER — Telehealth: Payer: Self-pay | Admitting: Cardiovascular Disease

## 2022-06-16 ENCOUNTER — Other Ambulatory Visit: Payer: Self-pay | Admitting: Cardiovascular Disease

## 2022-06-16 NOTE — Telephone Encounter (Signed)
Rx sent to pharmacy as requested    Receipt confirmed by pharmacy (06/16/2022 10:00 AM EDT)

## 2022-06-16 NOTE — Telephone Encounter (Signed)
*  STAT* If patient is at the pharmacy, call can be transferred to refill team.   1. Which medications need to be refilled? (please list name of each medication and dose if known) Metoprolol  2. Which pharmacy/location (including street and city if local pharmacy) is medication to be sent to? [facility], [place]  3. Do they need a 30 day or 90 day supply? 90  days and refills - please call today, no more medicine left- took the last one

## 2022-06-23 ENCOUNTER — Emergency Department: Payer: Medicare HMO

## 2022-06-23 ENCOUNTER — Other Ambulatory Visit: Payer: Self-pay

## 2022-06-23 ENCOUNTER — Emergency Department
Admission: EM | Admit: 2022-06-23 | Discharge: 2022-06-23 | Disposition: A | Discharge status: Home / Self Care | Payer: Medicare HMO | Attending: Emergency Medicine | Admitting: Emergency Medicine

## 2022-06-23 DIAGNOSIS — I2510 Atherosclerotic heart disease of native coronary artery without angina pectoris: Secondary | ICD-10-CM | POA: Insufficient documentation

## 2022-06-23 DIAGNOSIS — G4489 Other headache syndrome: Secondary | ICD-10-CM | POA: Diagnosis not present

## 2022-06-23 DIAGNOSIS — I4891 Unspecified atrial fibrillation: Secondary | ICD-10-CM | POA: Insufficient documentation

## 2022-06-23 DIAGNOSIS — I10 Essential (primary) hypertension: Secondary | ICD-10-CM | POA: Insufficient documentation

## 2022-06-23 DIAGNOSIS — Z7901 Long term (current) use of anticoagulants: Secondary | ICD-10-CM | POA: Diagnosis not present

## 2022-06-23 DIAGNOSIS — R519 Headache, unspecified: Secondary | ICD-10-CM | POA: Diagnosis not present

## 2022-06-23 DIAGNOSIS — Z743 Need for continuous supervision: Secondary | ICD-10-CM | POA: Diagnosis not present

## 2022-06-23 DIAGNOSIS — I6381 Other cerebral infarction due to occlusion or stenosis of small artery: Secondary | ICD-10-CM | POA: Diagnosis not present

## 2022-06-23 LAB — CBC WITH DIFFERENTIAL/PLATELET
Abs Immature Granulocytes: 0.04 10*3/uL (ref 0.00–0.07)
Basophils Absolute: 0 10*3/uL (ref 0.0–0.1)
Basophils Relative: 0 %
Eosinophils Absolute: 0.2 10*3/uL (ref 0.0–0.5)
Eosinophils Relative: 1 %
HCT: 44 % (ref 39.0–52.0)
Hemoglobin: 14.4 g/dL (ref 13.0–17.0)
Immature Granulocytes: 0 %
Lymphocytes Relative: 12 %
Lymphs Abs: 1.4 10*3/uL (ref 0.7–4.0)
MCH: 30.4 pg (ref 26.0–34.0)
MCHC: 32.7 g/dL (ref 30.0–36.0)
MCV: 93 fL (ref 80.0–100.0)
Monocytes Absolute: 0.6 10*3/uL (ref 0.1–1.0)
Monocytes Relative: 5 %
Neutro Abs: 9.5 10*3/uL — ABNORMAL HIGH (ref 1.7–7.7)
Neutrophils Relative %: 82 %
Platelets: 122 10*3/uL — ABNORMAL LOW (ref 150–400)
RBC: 4.73 MIL/uL (ref 4.22–5.81)
RDW: 12.3 % (ref 11.5–15.5)
WBC: 11.8 10*3/uL — ABNORMAL HIGH (ref 4.0–10.5)
nRBC: 0 % (ref 0.0–0.2)

## 2022-06-23 LAB — SEDIMENTATION RATE: Sed Rate: 9 mm/hr (ref 0–20)

## 2022-06-23 LAB — HEPATIC FUNCTION PANEL
ALT: 19 U/L (ref 0–44)
AST: 20 U/L (ref 15–41)
Albumin: 4 g/dL (ref 3.5–5.0)
Alkaline Phosphatase: 92 U/L (ref 38–126)
Bilirubin, Direct: 0.2 mg/dL (ref 0.0–0.2)
Indirect Bilirubin: 0.8 mg/dL (ref 0.3–0.9)
Total Bilirubin: 1 mg/dL (ref 0.3–1.2)
Total Protein: 7.6 g/dL (ref 6.5–8.1)

## 2022-06-23 LAB — BASIC METABOLIC PANEL
Anion gap: 7 (ref 5–15)
BUN: 12 mg/dL (ref 8–23)
CO2: 27 mmol/L (ref 22–32)
Calcium: 9.1 mg/dL (ref 8.9–10.3)
Chloride: 109 mmol/L (ref 98–111)
Creatinine, Ser: 0.86 mg/dL (ref 0.61–1.24)
GFR, Estimated: >60 mL/min (ref >60)
Glucose, Bld: 129 mg/dL — ABNORMAL HIGH (ref 70–99)
Potassium: 3.4 mmol/L — ABNORMAL LOW (ref 3.5–5.1)
Sodium: 143 mmol/L (ref 135–145)

## 2022-06-23 LAB — C-REACTIVE PROTEIN: CRP: 2.3 mg/dL — ABNORMAL HIGH (ref <1.0)

## 2022-06-23 MED ORDER — SUMATRIPTAN SUCCINATE 6 MG/0.5ML ~~LOC~~ SOLN
6.0000 mg | Freq: Once | SUBCUTANEOUS | Status: AC
  Administered 2022-06-23: 6 mg via SUBCUTANEOUS
  Filled 2022-06-23: qty 0.5

## 2022-06-23 MED ORDER — METOPROLOL TARTRATE 25 MG PO TABS
100.0000 mg | ORAL_TABLET | Freq: Once | ORAL | Status: AC
  Administered 2022-06-23: 100 mg via ORAL
  Filled 2022-06-23: qty 4

## 2022-06-23 NOTE — ED Provider Triage Note (Signed)
Emergency Medicine Provider Triage Evaluation Note  Carl Alvarez, a 76 y.o. male  was evaluated in triage.  Pt complains of right temple pain and right eye tearing.  Resents today noting worsening symptoms.  He denies any vision change, vertigo, dizziness, or tinnitus.  He also denies any recent trauma, trauma, or eye surgeries.  He gives a history of A-fib on Eliquis, TIA, HTN. HLD, and CAD.   Review of Systems  Positive: Right temple pain, Right eye tearing Negative: NV, vision loss, paresthesias  Physical Exam  BP (!) 159/105 (BP Location: Right Arm)   Pulse 98   Temp 98.3 F (36.8 C) (Oral)   Resp 17   Ht 5' 9"  (1.753 m)   Wt 100.7 kg   SpO2 96%   BMI 32.78 kg/m  Gen:   Awake, no distress  NAD Resp:  Normal effort CTA MSK:   Moves extremities without difficulty  Eyes:  Right eye with conjunctival erythema noted   Medical Decision Making  Medically screening exam initiated at 11:51 AM.  Appropriate orders placed.  Georgianne Fick was informed that the remainder of the evaluation will be completed by another provider, this initial triage assessment does not replace that evaluation, and the importance of remaining in the ED until their evaluation is complete.  Geriatric patient presents to the ED via EMS from home, with complaints of right-sided temple pain as well as some right eye tearing and discomfort.  He denies any recent trauma.    Melvenia Needles, PA-C 06/23/22 1206

## 2022-06-23 NOTE — ED Notes (Addendum)
See triage note. Pt denies history of headaches or migraines; pt reports sharp pulsing pain from R ear to R jaw lasted for 2 hours; reports made his eyes water; denies CP, SOB, vision changes; denies pain currently; denies any specific event right before pain came on; reports felt like an "ear ache or arthritis"; pt reports took a zyrtec and Flonase which helped ease the pain. Pt in NAD. Visitor at bedside. Pt reports takes his BP med at nighttime.

## 2022-06-23 NOTE — ED Provider Notes (Signed)
[facility] Provider Note    Event Date/Time   First MD Initiated Contact with Patient 06/23/22 1334     (approximate)   History   Headache   HPI  Carl Alvarez is a 76 y.o. male   Past medical history of hypertension, atrial fibrillation on Eliquis, CAD, who presents with a right-sided headache sharp stabbing pain severe to the right temple and right eye, no alleviating or exacerbating factors.  No trauma.  Has never had a headache like this in the past.  He has had short discrete episodes intermittently for the past several days.  Today's episode was quite severe.  He notes that his eyes appear red and sometimes he has tearing to the right eye.  He has no visual changes.  No motor or sensory complaints.  He has otherwise been in his regular state of health.  He denies chest pains or shortness of breath nausea or vomiting.  History was obtained via patient and his daughter who is at bedside      Physical Exam   Triage Vital Signs: ED Triage Vitals  Enc Vitals Group     BP 06/23/22 1143 (!) 159/105     Pulse Rate 06/23/22 1143 98     Resp 06/23/22 1143 17     Temp 06/23/22 1143 98.3 F (36.8 C)     Temp Source 06/23/22 1143 Oral     SpO2 06/23/22 1143 96 %     Weight 06/23/22 1147 222 lb (100.7 kg)     Height 06/23/22 1147 5' 9" (1.753 m)     Head Circumference --      Peak Flow --      Pain Score 06/23/22 1147 9     Pain Loc --      Pain Edu? --      Excl. in [place]? --     Most recent vital signs: Vitals:   06/23/22 1545 06/23/22 1631  BP:  (!) 195/120  Pulse: (!) 54 81  Resp:  18  Temp:    SpO2: 100% 98%    General: Awake, no distress.  CV:  Good peripheral perfusion.  Resp:  Normal effort.  Lungs clear to auscultation bilaterally Abd:  No distention.  Soft and nontender Other:  He has not injected red conjunctiva, visual acuity is intact, EXTR ocular movements intact, no temporal tenderness, no signs of head trauma, no skin  changes to the TM or ear canal or face, no focal neurological deficits including motor or sensory weakness, he is ambulatory with a steady gait.  Finger-to-nose testing normal   ED Results / Procedures / Treatments   Labs (all labs ordered are listed, but only abnormal results are displayed) Labs Reviewed  BASIC METABOLIC PANEL - Abnormal; Notable for the following components:      Result Value   Potassium 3.4 (*)    Glucose, Bld 129 (*)    All other components within normal limits  CBC WITH DIFFERENTIAL/PLATELET - Abnormal; Notable for the following components:   WBC 11.8 (*)    Platelets 122 (*)    Neutro Abs 9.5 (*)    All other components within normal limits  C-REACTIVE PROTEIN - Abnormal; Notable for the following components:   CRP 2.3 (*)    All other components within normal limits  SEDIMENTATION RATE  HEPATIC FUNCTION PANEL     I reviewed labs and they are notable for ESR normal.     RADIOLOGY I Independently  reviewed and interpreted CT of the head and so no obvious hemorrhage or midline shift   PROCEDURES:  Critical Care performed: No  Procedures   MEDICATIONS ORDERED IN ED: Medications  SUMAtriptan (IMITREX) injection 6 mg (6 mg Subcutaneous Given 06/23/22 1534)  metoprolol tartrate (LOPRESSOR) tablet 100 mg (100 mg Oral Given 06/23/22 1642)     IMPRESSION / MDM / ASSESSMENT AND PLAN / ED COURSE  I reviewed the triage vital signs and the nursing notes.                              Differential diagnosis includes, but is not limited to, intracranial bleeding, cluster headache, migraine headache, tension type headache, considered but less likely temporal arteritis given normal inflammatory markers, no tenderness to palpation of the temporal area, trigeminal neuralgia, CVA.   MDM: Differential diagnosis as above, CT of the head negative for hemorrhage.  No focal neurological deficits to suggest CVA.  Considered temporal arteritis but no vision changes and  no temporal tenderness and normal inflammatory markers, unlikely.  He responded very well to treatment for cluster headache which this is most likely, pain much improved in the emergency department and he will be discharged with PMD follow-up and return with any worsening.   Patient's presentation is most consistent with acute presentation with potential threat to life or bodily function.       FINAL CLINICAL IMPRESSION(S) / ED DIAGNOSES   Final diagnoses:  Acute nonintractable headache, unspecified headache type     Rx / DC Orders   ED Discharge Orders     None        Note:  This document was prepared using Dragon voice recognition software and may include unintentional dictation errors.    Lucillie Garfinkel, MD 06/23/22 [id number]

## 2022-06-23 NOTE — Discharge Instructions (Addendum)
Take tylenol 650 mg for headache.   If you have any new or worsening symptoms come back to the emergency department.   See your doctor next week

## 2022-06-23 NOTE — ED Triage Notes (Signed)
Patient arrived by EMS. C/o headache, right temple X1 week. A&O x4 upon arrival   EMS vitals: 158/94 b/p

## 2022-06-23 NOTE — ED Triage Notes (Signed)
Pt began having sharp pain beside right eye 2 weeks ago. Pt feels like the pain is worse today. Pt has watery right eye and feels a pain behind it. Pt denies any vision changes.

## 2022-06-26 NOTE — Addendum Note (Signed)
Addended by: Orland Mustard on: 06/26/2022 05:55 PM   Modules accepted: Orders

## 2022-06-27 ENCOUNTER — Encounter: Payer: Self-pay | Admitting: Emergency Medicine

## 2022-06-27 ENCOUNTER — Emergency Department
Admission: EM | Admit: 2022-06-27 | Discharge: 2022-06-27 | Disposition: A | Discharge status: Home / Self Care | Payer: Medicare HMO | Attending: Emergency Medicine | Admitting: Emergency Medicine

## 2022-06-27 ENCOUNTER — Other Ambulatory Visit: Payer: Self-pay

## 2022-06-27 DIAGNOSIS — R519 Headache, unspecified: Secondary | ICD-10-CM | POA: Diagnosis not present

## 2022-06-27 DIAGNOSIS — R6889 Other general symptoms and signs: Secondary | ICD-10-CM | POA: Diagnosis not present

## 2022-06-27 DIAGNOSIS — R202 Paresthesia of skin: Secondary | ICD-10-CM | POA: Diagnosis not present

## 2022-06-27 DIAGNOSIS — J0100 Acute maxillary sinusitis, unspecified: Secondary | ICD-10-CM | POA: Insufficient documentation

## 2022-06-27 DIAGNOSIS — R0902 Hypoxemia: Secondary | ICD-10-CM | POA: Diagnosis not present

## 2022-06-27 DIAGNOSIS — G4489 Other headache syndrome: Secondary | ICD-10-CM | POA: Diagnosis not present

## 2022-06-27 DIAGNOSIS — Z743 Need for continuous supervision: Secondary | ICD-10-CM | POA: Diagnosis not present

## 2022-06-27 MED ORDER — PREDNISONE 50 MG PO TABS: 50.0000 mg | ORAL_TABLET | Freq: Every day | ORAL | 0 refills | Status: DC

## 2022-06-27 MED ORDER — DOXYCYCLINE HYCLATE 100 MG PO TABS: 100.0000 mg | ORAL_TABLET | Freq: Two times a day (BID) | ORAL | 0 refills | Status: DC

## 2022-06-27 NOTE — ED Triage Notes (Signed)
C/o facial pain.  Seen Friday for same   AAOx3.  Skin warm and dry. NAD

## 2022-06-27 NOTE — ED Triage Notes (Signed)
Pt comes via EMS from home with c/o temple headache. Pt states weakness for 2 weeks. Pt seen here recently and ruled out for stroke. Mini neuro neg per EMs. Pt does states some bilateral arm tingling that happened earlier today but has now resolved. Pt is on thinners.   BP-172/108

## 2022-06-27 NOTE — ED Provider Notes (Signed)
[facility] Provider Note  Patient Contact: 4:13 PM (approximate)   History   Headache   HPI  CYLER KAPPES is a 76 y.o. male who presents to the emergency department complaining of ongoing headache and sinus pressure.  Patient was seen yesterday, had a negative work-up.  Patient states that he is still having sinus pressure and headache.  Patient states that he feels like he has a sinus infection and related antibiotics, was not prescribed antibiotics yesterday.  He states that he is using Flonase and Zyrtec at home.  No fevers or chills, sore throat, or cough.  Patient denies any unilateral weakness, vision changes.  Again he had reassuring work-up yesterday with no new changes.     Physical Exam   Triage Vital Signs: ED Triage Vitals  Enc Vitals Group     BP 06/27/22 1609 (!) 169/97     Pulse Rate 06/27/22 1609 95     Resp 06/27/22 1609 16     Temp 06/27/22 1609 98.2 F (36.8 C)     Temp Source 06/27/22 1609 Oral     SpO2 06/27/22 1609 98 %     Weight 06/27/22 1608 220 lb 7.4 oz (100 kg)     Height 06/27/22 1608 5' 9"  (1.753 m)     Head Circumference --      Peak Flow --      Pain Score 06/27/22 1608 8     Pain Loc --      Pain Edu? --      Excl. in [place]? --     Most recent vital signs: Vitals:   06/27/22 1609  BP: (!) 169/97  Pulse: 95  Resp: 16  Temp: 98.2 F (36.8 C)  SpO2: 98%     General: Alert and in no acute distress. Eyes:  PERRL. EOMI Head: No acute traumatic findings ENT:      Ears:       Nose: No congestion/rhinnorhea.  There is percussion over the maxillary sinuses      Mouth/Throat: Mucous membranes are moist. Neck: No stridor. No cervical spine tenderness to palpation.  Cardiovascular:  Good peripheral perfusion Respiratory: Normal respiratory effort without tachypnea or retractions. Lungs CTAB.  Musculoskeletal: Full range of motion to all extremities.  Neurologic:  No gross focal neurologic deficits are  appreciated.  Cranial nerves II through XII grossly intact. Skin:   No rash noted Other:   ED Results / Procedures / Treatments   Labs (all labs ordered are listed, but only abnormal results are displayed) Labs Reviewed - No data to display   EKG     RADIOLOGY    No results found.  PROCEDURES:  Critical Care performed: No  Procedures   MEDICATIONS ORDERED IN ED: Medications - No data to display   IMPRESSION / MDM / [place] / ED COURSE  I reviewed the triage vital signs and the nursing notes.                              Differential diagnosis includes, but is not limited to, CVA, migraine, tension type headache, sinusitis, temporal arteritis  Patient's presentation is most consistent with acute presentation with potential threat to life or bodily function.   Patient's diagnosis is consistent with headache, sinusitis.  Patient presents to the ED with congestion, sinus pressure, headache.  Patient was seen yesterday with a reassuring work-up.  No concerning neuro deficits.  Patient states that he wanted antibiotic for sinus infection but was not given seen yesterday.  Patient does not wish to pursue further imaging or labs, states that he is here for antibiotic.  He is tender to percussion over the maxillary sinuses.  As he had a reassuring work-up yesterday, no deficits today, and findings consistent with sinusitis with underlying headache I will treat for same.  Concerning signs and symptoms are discussed with the patient.  Return precautions discussed with the patient.  Follow-up primary care as needed..  Patient is given ED precautions to return to the ED for any worsening or new symptoms.        FINAL CLINICAL IMPRESSION(S) / ED DIAGNOSES   Final diagnoses:  Acute maxillary sinusitis, recurrence not specified  Acute nonintractable headache, unspecified headache type     Rx / DC Orders   ED Discharge Orders          Ordered    doxycycline  (VIBRA-TABS) 100 MG tablet  2 times daily        06/27/22 1637    predniSONE (DELTASONE) 50 MG tablet  Daily with breakfast        06/27/22 1637             Note:  This document was prepared using Dragon voice recognition software and may include unintentional dictation errors.   Brynda Peon 06/27/22 Smith Mince, MD 06/27/22 [phone]

## 2022-07-01 ENCOUNTER — Other Ambulatory Visit: Payer: Self-pay | Admitting: Cardiovascular Disease

## 2022-07-03 NOTE — Telephone Encounter (Signed)
Refill request

## 2022-07-03 NOTE — Telephone Encounter (Signed)
Prescription refill request for Eliquis received. Indication: AF Last office visit:  07/12/21  Johnny Bridge MD  (Has OV on 07/19/22) Scr: 0.86 on 06/23/22 Age: 76 Weight: 99.8kg  Based on above findings Eliquis 57m twice daily is the appropriate dose.  Refill approved.
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
## 2022-07-19 ENCOUNTER — Encounter: Payer: Self-pay | Admitting: Physician Assistant

## 2022-07-19 ENCOUNTER — Ambulatory Visit: Payer: BC Managed Care – PPO | Attending: Physician Assistant | Admitting: Physician Assistant

## 2022-07-19 VITALS — BP 142/88 | HR 87 | Ht 71.0 in | Wt 223.4 lb

## 2022-07-19 DIAGNOSIS — I639 Cerebral infarction, unspecified: Secondary | ICD-10-CM | POA: Diagnosis not present

## 2022-07-19 DIAGNOSIS — I2510 Atherosclerotic heart disease of native coronary artery without angina pectoris: Secondary | ICD-10-CM

## 2022-07-19 DIAGNOSIS — I10 Essential (primary) hypertension: Secondary | ICD-10-CM

## 2022-07-19 DIAGNOSIS — J449 Chronic obstructive pulmonary disease, unspecified: Secondary | ICD-10-CM | POA: Diagnosis not present

## 2022-07-19 DIAGNOSIS — I700 Atherosclerosis of aorta: Secondary | ICD-10-CM | POA: Diagnosis not present

## 2022-07-19 DIAGNOSIS — I4821 Permanent atrial fibrillation: Secondary | ICD-10-CM

## 2022-07-19 DIAGNOSIS — E785 Hyperlipidemia, unspecified: Secondary | ICD-10-CM

## 2022-07-19 NOTE — Patient Instructions (Signed)
Medication Instructions:  No changes at this time.   *If you need a refill on your cardiac medications before your next appointment, please call your pharmacy*   Lab Work: None  If you have labs (blood work) drawn today and your tests are completely normal, you will receive your results only by: [place] (if you have MyChart) OR A paper copy in the mail If you have any lab test that is abnormal or we need to change your treatment, we will call you to review the results.   Testing/Procedures: None  Follow-Up: At [facility], you and your health needs are our priority.  As part of our continuing mission to provide you with exceptional heart care, we have created designated Provider Care Teams.  These Care Teams include your primary Cardiologist (physician) and Advanced Practice Providers (APPs -  Physician Assistants and Nurse Practitioners) who all work together to provide you with the care you need, when you need it.   Your next appointment:   1 year(s)  The format for your next appointment:   In Person  Provider:   Ida Rogue, MD or Christell Faith, PA-C        Important Information About Sugar

## 2022-08-05 ENCOUNTER — Other Ambulatory Visit: Payer: Self-pay | Admitting: Cardiovascular Disease

## 2022-08-16 ENCOUNTER — Other Ambulatory Visit: Payer: Self-pay | Admitting: Cardiovascular Disease

## 2022-09-23 IMAGING — MR MR HEAD W/O CM
10 series · 48 of 48 positions shown · non-contrast
Comparison: Prior head CT 07/21/2012. Brain MRI 11/22/2011.

CLINICAL DATA: Fatigue, unspecified type. Dizziness,
persistent/recurrent, cardiac or vascular cause suspected.

EXAM:
MRI HEAD WITHOUT CONTRAST
TECHNIQUE: Multiplanar, multiecho pulse sequences of the brain and surrounding
structures were obtained without intravenous contrast.

[Series 2: DWI · axial · 3.0mm · 1.20mm/px · z∈[-59,+103]mm · 7 of 83 slices shown (1 of 4)]
[im 1/83]
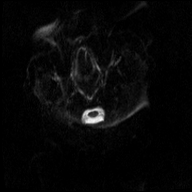
[im 14/83]
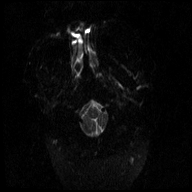
[im 28/83]
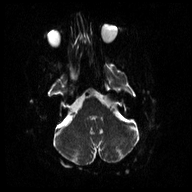
[im 42/83]
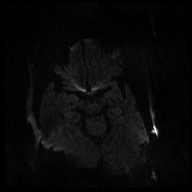
[im 55/83]
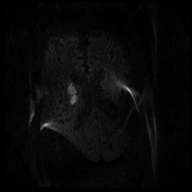
[im 69/83]
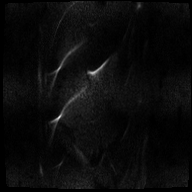
[im 83/83]
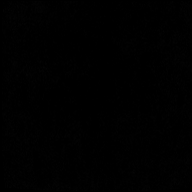

[Series 3: DWI · axial · 3.0mm · 1.20mm/px · z∈[-59,+103]mm · 4 of 48 slices shown (2 of 4)]
[im 1/48]
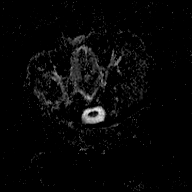
[im 16/48]
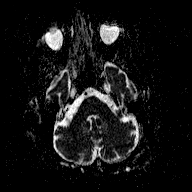
[im 32/48]
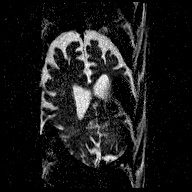
[im 48/48]
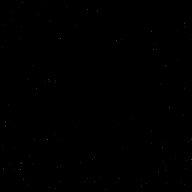

[Series 4: T1 · sagittal · 5.0mm · 0.45mm/px · 2 of 25 slices shown (1 of 2)]
[im 1/25]
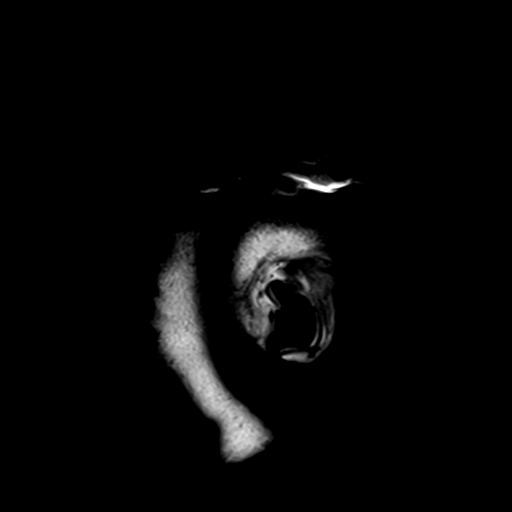
[im 25/25]
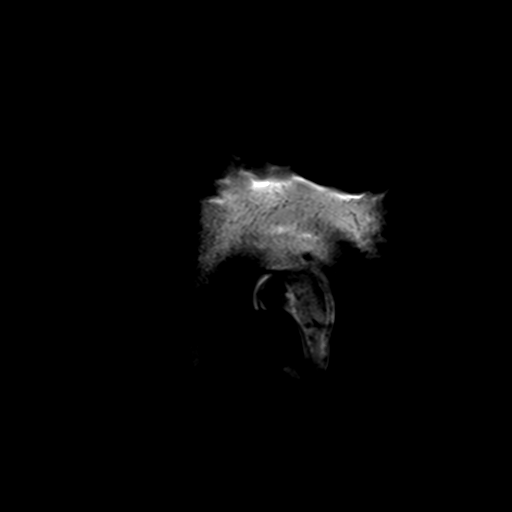

[Series 5: DWI · coronal · 3.0mm · 1.15mm/px · 7 of 90 slices shown (3 of 4)]
[im 1/90]
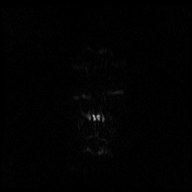
[im 15/90]
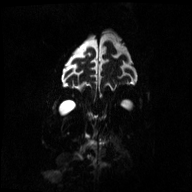
[im 30/90]
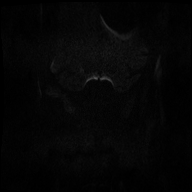
[im 45/90]
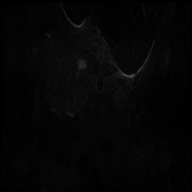
[im 60/90]
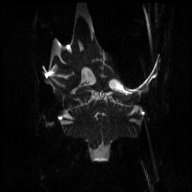
[im 75/90]
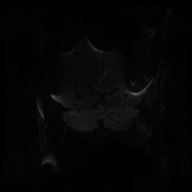
[im 90/90]
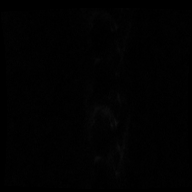

[Series 6: DWI · coronal · 3.0mm · 1.15mm/px · 4 of 53 slices shown (4 of 4)]
[im 1/53]
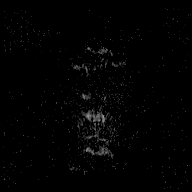
[im 18/53]
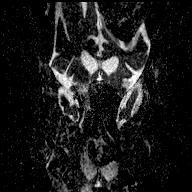
[im 35/53]
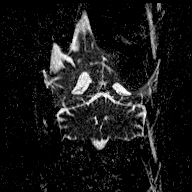
[im 53/53]
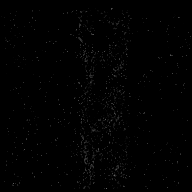

[Series 7: T2 · axial · 5.0mm · 0.72mm/px · z∈[-62,+106]mm · 2 of 25 slices shown (1 of 3)]
[im 1/25]
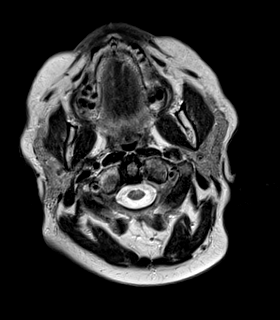
[im 25/25]
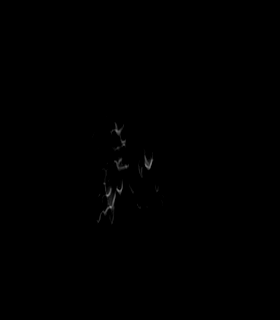

[Series 8: FLAIR · axial · 3.0mm · 0.45mm/px · z∈[-59,+103]mm · 4 of 55 slices shown]
[im 1/55]
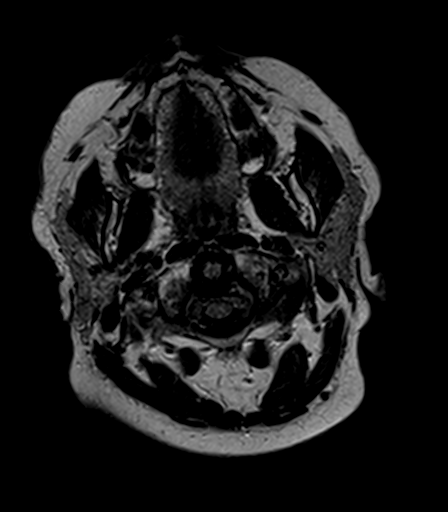
[im 19/55]
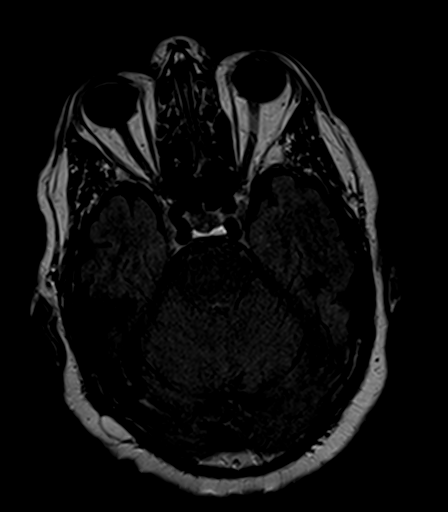
[im 37/55]
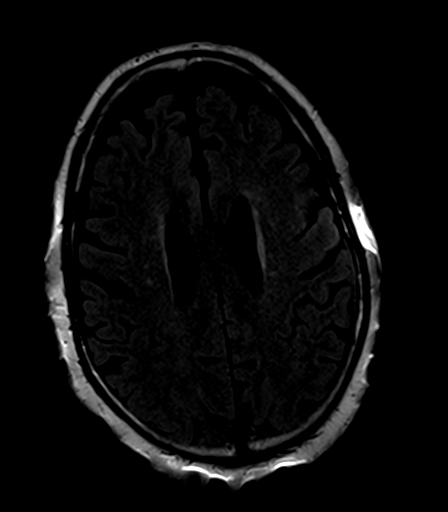
[im 55/55]
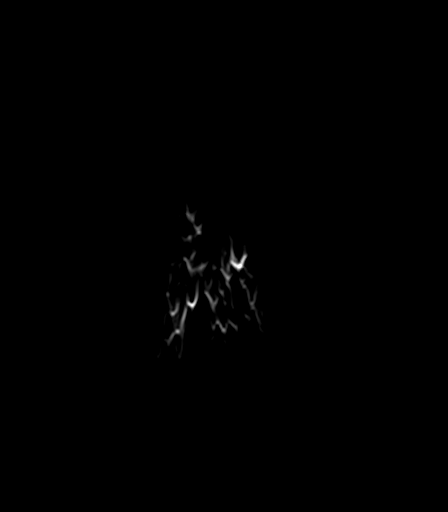

[Series 9: T2 · axial · 5.0mm · 0.72mm/px · z∈[-62,+106]mm · 2 of 25 slices shown (2 of 3)]
[im 1/25]
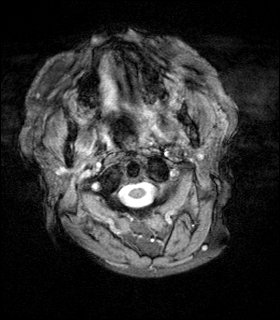
[im 25/25]
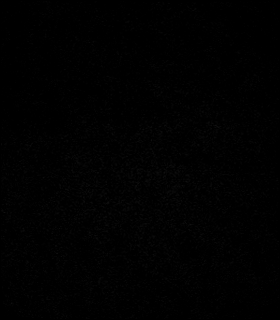

[Series 10: T1 · axial · 1.0mm · 1.00mm/px · z∈[-58,+101]mm · 13 of 160 slices shown (2 of 2)]
[im 1/160]
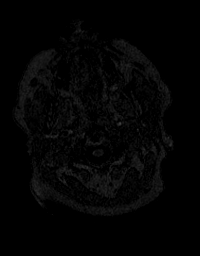
[im 14/160]
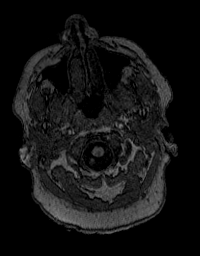
[im 27/160]
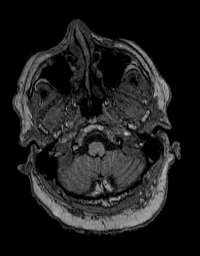
[im 40/160]
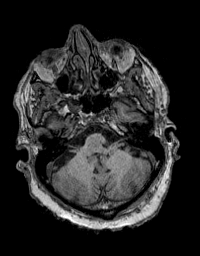
[im 54/160]
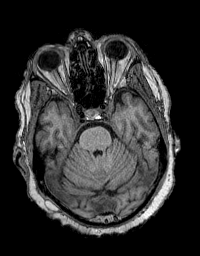
[im 67/160]
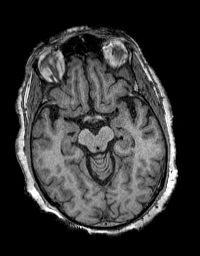
[im 80/160]
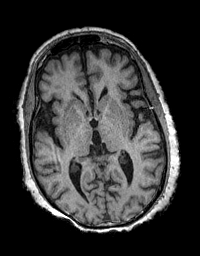
[im 93/160]
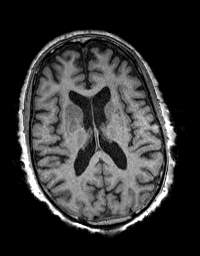
[im 107/160]
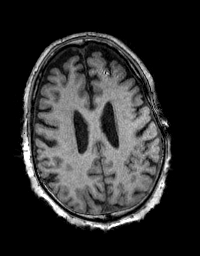
[im 120/160]
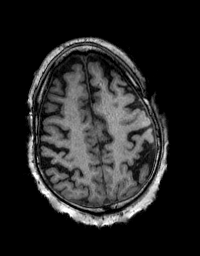
[im 133/160]
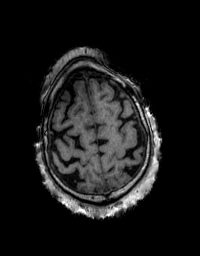
[im 146/160]
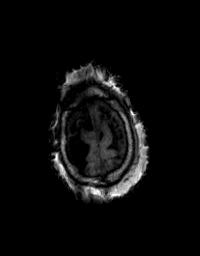
[im 160/160]
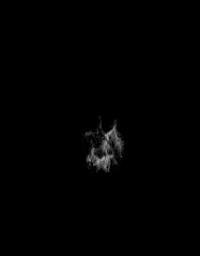

[Series 11: T2 · coronal · 5.0mm · 0.43mm/px · 3 of 31 slices shown (3 of 3)]
[im 1/31]
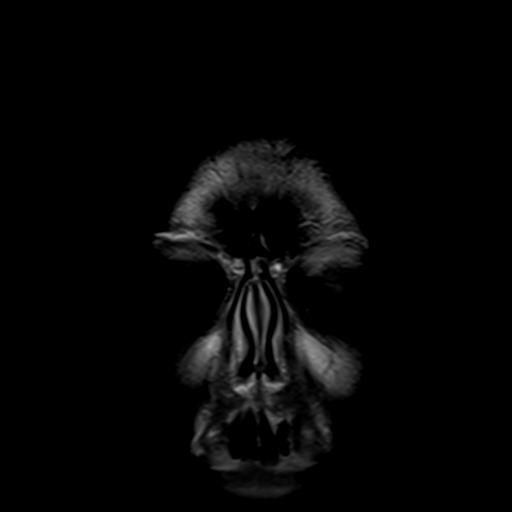
[im 16/31]
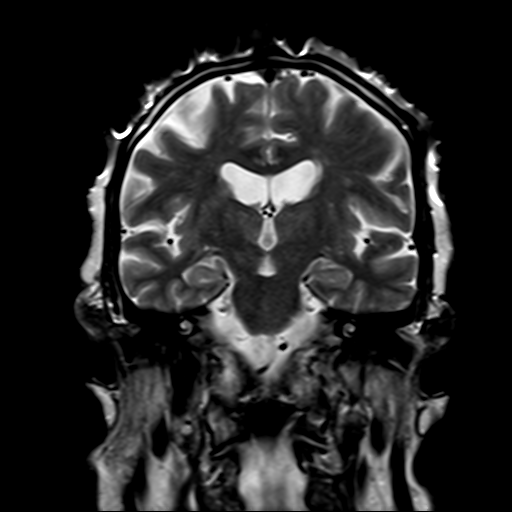
[im 31/31]
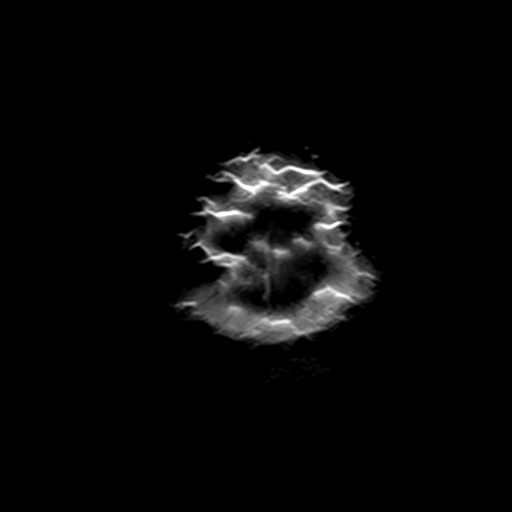

[48 of 48 positions shown; findings below may reference images not displayed]

FINDINGS: Brain:

There is prominent susceptibility artifact arising from hair
product, obscuring portions of the bilateral cerebral hemispheres,
most notably on the diffusion-weighted and axial SWI sequences.

Mild generalized cerebral and cerebellar atrophy.

Acute/early subacute infarct measuring 3.2 x 1.1 x 3.3 cm (AP x TV x
CC) involving the right corona radiata, right caudate and lentiform
nuclei, as well as the anterior limb of the right internal capsule.

A subtle subcentimeter acute/early subacute infarct is also
questioned within the posterior right insula (series 2, image 80).

An additional small focus of acute/early subacute infarction is
questioned within the right periatrial white matter (series 2, image
90).

Background mild multifocal T2/FLAIR hyperintensity within the
cerebral white matter, nonspecific but compatible with chronic small
vessel ischemic disease.

No evidence of intracranial mass.

Within described limitations, no chronic intracranial blood products
are identified.

No extra-axial fluid collection.

No midline shift.

Vascular: Expected proximal arterial flow voids.

Skull and upper cervical spine: No focal marrow lesion.

Sinuses/Orbits: Visualized orbits show no acute finding. Trace
bilateral ethmoid sinus mucosal thickening. Mild-to-moderate mucosal
thickening within the right sphenoid sinus.

These results were called by telephone at the time of interpretation
on 03/08/2021 at [DATE] to provider BLONDINACKA SAMSONAITE , who verbally
acknowledged these results. NP Montagna requested that the patient be
transported to the [HOSPITAL] this time, and the
technology staff at the [RED[facility]] have been notified.
IMPRESSION: Examination limited by susceptibility artifact arising from hair
product, as described.

3.2 x 1.1 x 3.3 cm acute/early subacute infarct involving the right
corona radiata, right caudate and lentiform nuclei, as well as
anterior limb of right internal capsule.

Additional small acute/early subacute infarcts are questioned within
the right insula and right periatrial white matter.

Background mild generalized parenchymal atrophy and mild cerebral
white matter chronic small vessel ischemic disease, progressed from
the brain MRI of 11/22/2011.

Paranasal sinus disease, as described.

## 2022-09-24 IMAGING — US US CAROTID DUPLEX BILAT
1 series · 14 of 24 positions shown · non-contrast
Comparison: 11/22/2011

CLINICAL DATA: CVA

Hypertension
Former smoker
EXAM:
BILATERAL CAROTID DUPLEX ULTRASOUND
TECHNIQUE: Gray scale imaging, color Doppler and duplex ultrasound were
performed of bilateral carotid and vertebral arteries in the neck.

[Series 1: us carotid bilateral · 14 of 72 slices shown]
[im 1/72]
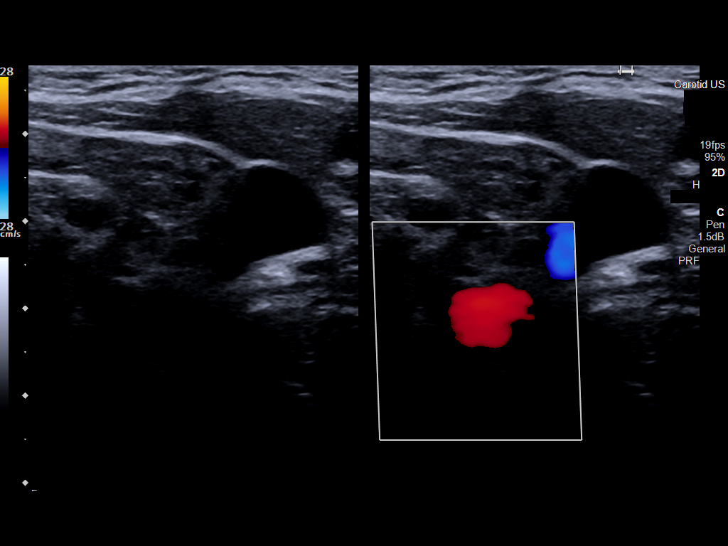
[im 7/72]
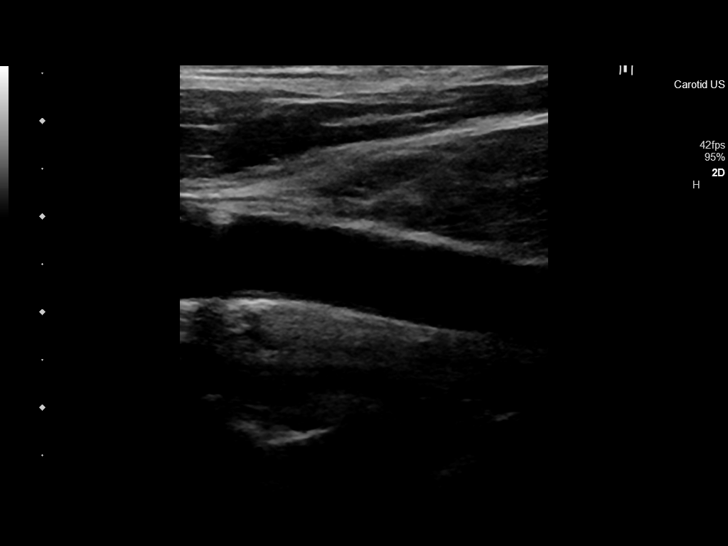
[im 13/72]
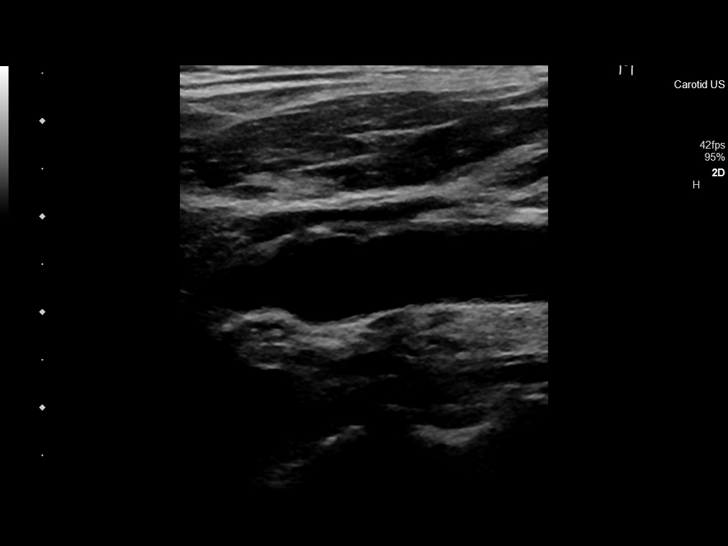
[im 19/72]
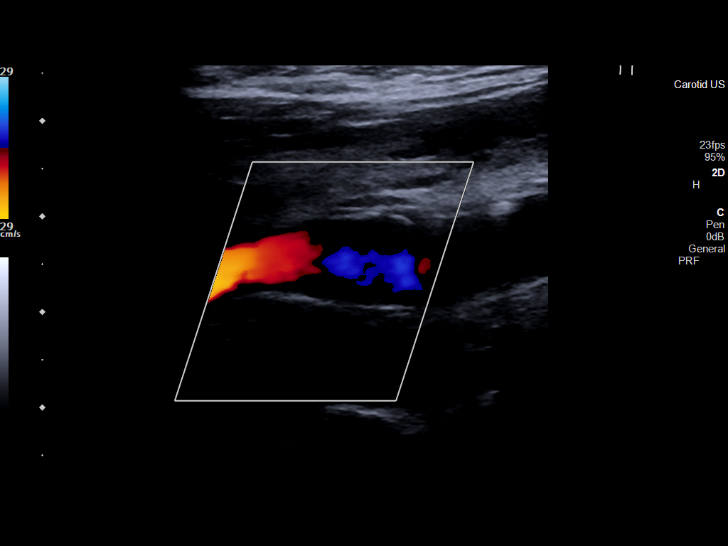
[im 22/72]
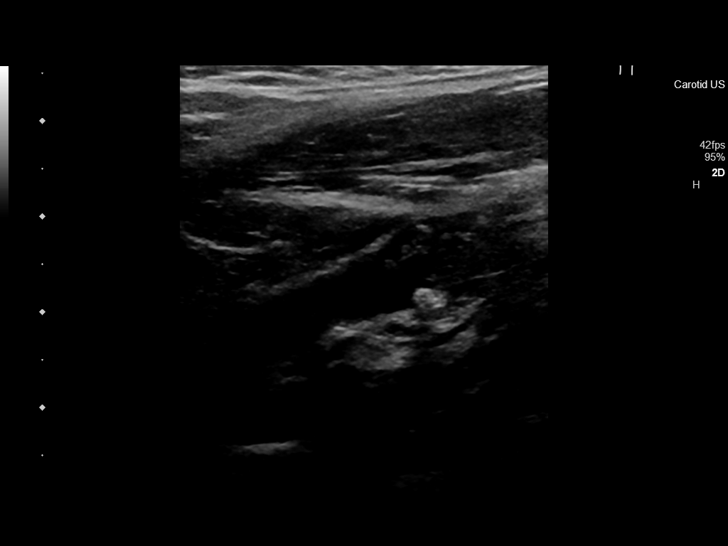
[im 28/72]
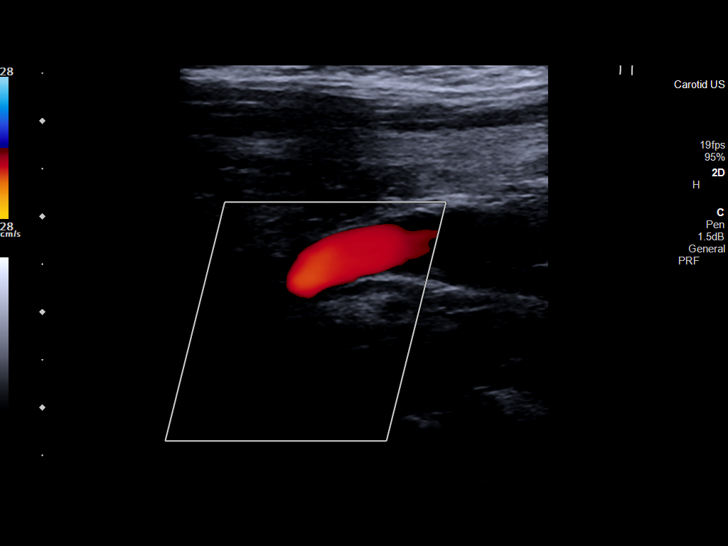
[im 34/72]
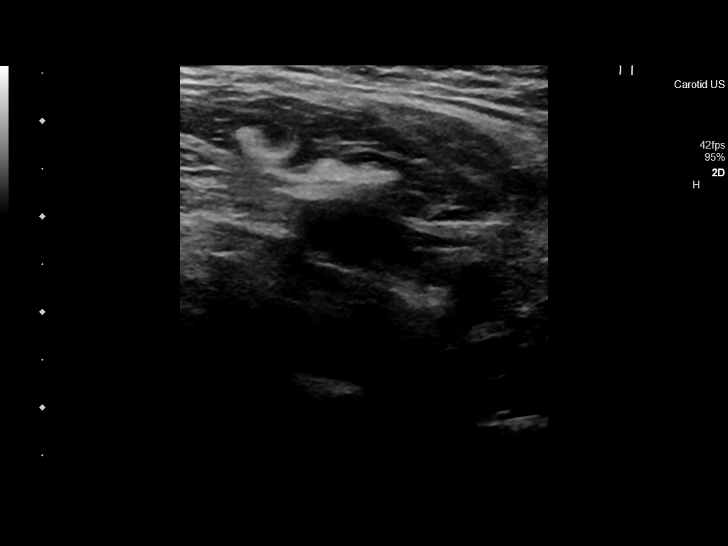
[im 38/72]
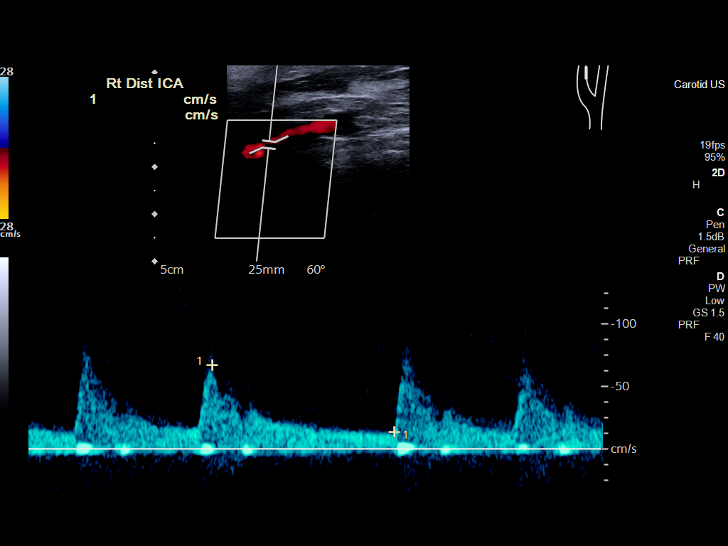
[im 44/72]
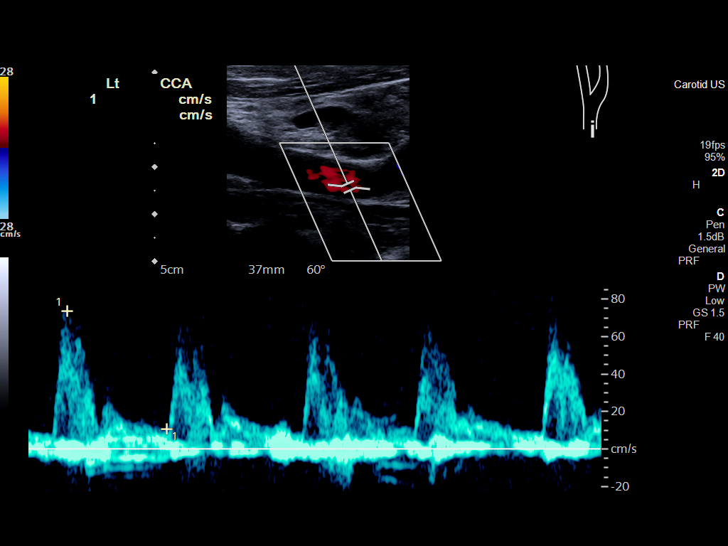
[im 50/72]
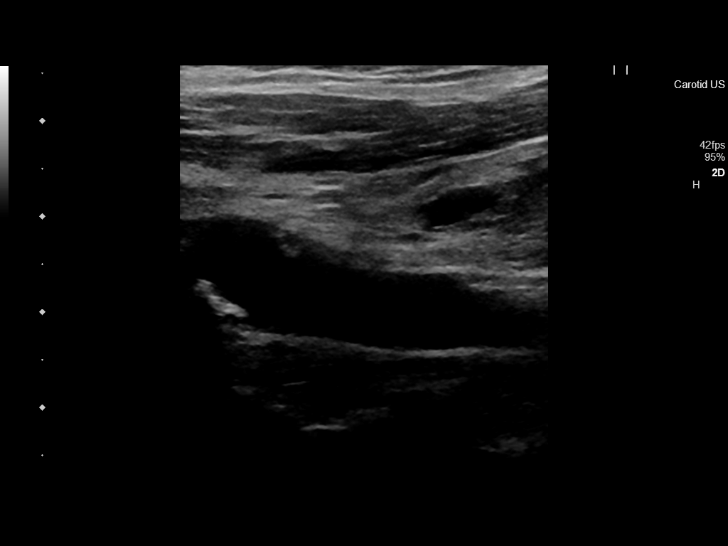
[im 56/72]
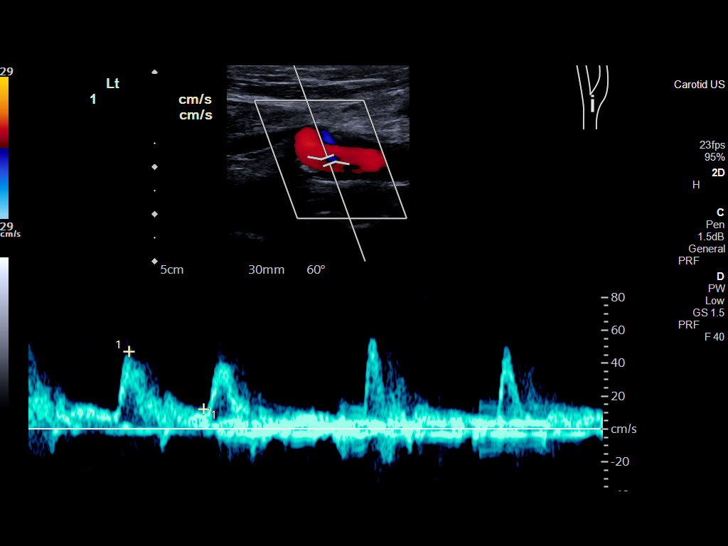
[im 59/72]
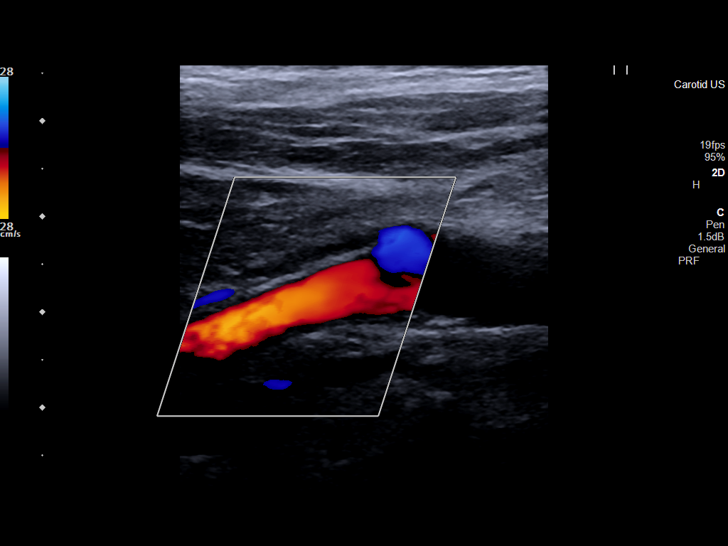
[im 65/72]
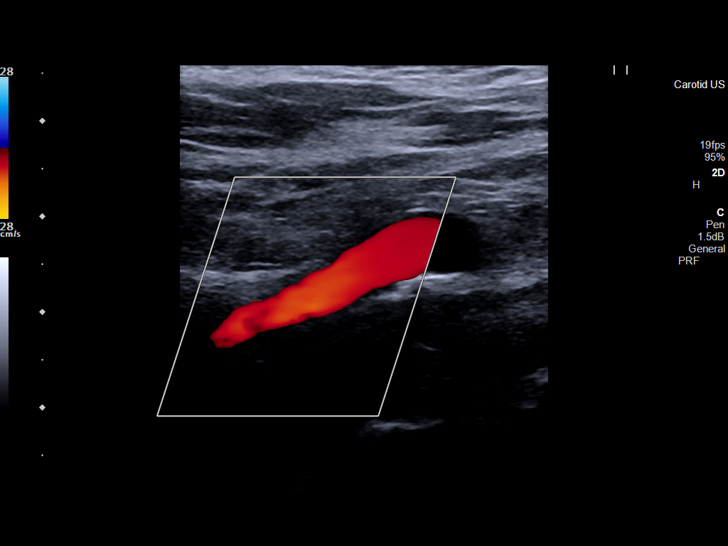
[im 72/72]
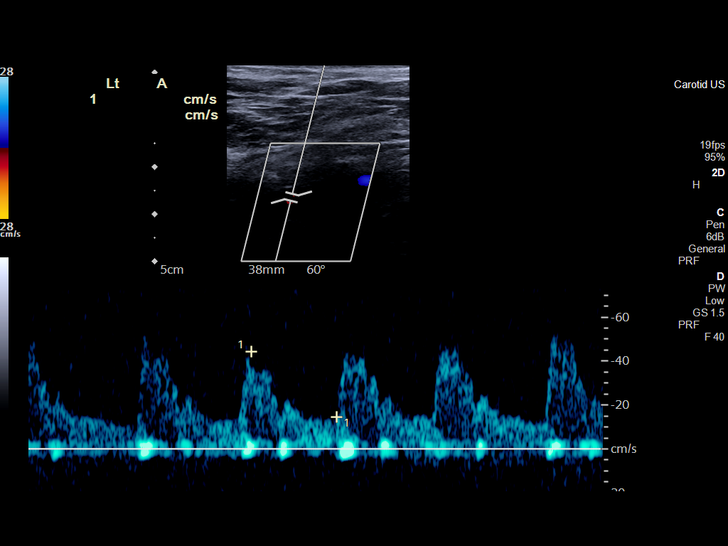

[14 of 24 positions shown; findings below may reference images not displayed]

FINDINGS: Criteria: Quantification of carotid stenosis is based on velocity
parameters that correlate the residual internal carotid diameter
with NASCET-based stenosis levels, using the diameter of the distal
internal carotid lumen as the denominator for stenosis measurement.

The following velocity measurements were obtained:

RIGHT

ICA: 121/35 cm/sec

CCA: 70/10 cm/sec

SYSTOLIC ICA/CCA RATIO:

ECA: 92 cm/sec

LEFT

ICA: 89/32 cm/sec

CCA: 74/15 cm/sec

SYSTOLIC ICA/CCA RATIO:

ECA: 74 cm/sec

RIGHT CAROTID ARTERY: Minimal atheromatous plaque of the carotid
bulb without flow-limiting stenosis.

RIGHT VERTEBRAL ARTERY:  Antegrade flow.

LEFT CAROTID ARTERY: Minimal atheromatous plaque of the carotid bulb
without flow-limiting stenosis.

LEFT VERTEBRAL ARTERY:  Antegrade flow.
IMPRESSION: No significant stenosis of the internal carotid arteries.

## 2022-10-31 ENCOUNTER — Encounter: Payer: Self-pay | Admitting: Nurse Practitioner

## 2022-10-31 ENCOUNTER — Encounter: Payer: Medicare HMO | Admitting: Nurse Practitioner

## 2022-10-31 ENCOUNTER — Ambulatory Visit (INDEPENDENT_AMBULATORY_CARE_PROVIDER_SITE_OTHER): Payer: Medicare HMO | Admitting: Nurse Practitioner

## 2022-10-31 VITALS — BP 130/88 | HR 86 | Temp 98.0°F | Ht 71.0 in | Wt 220.8 lb

## 2022-10-31 DIAGNOSIS — R7303 Prediabetes: Secondary | ICD-10-CM | POA: Diagnosis not present

## 2022-10-31 DIAGNOSIS — Z23 Encounter for immunization: Secondary | ICD-10-CM

## 2022-10-31 DIAGNOSIS — Z1329 Encounter for screening for other suspected endocrine disorder: Secondary | ICD-10-CM | POA: Diagnosis not present

## 2022-10-31 DIAGNOSIS — E782 Mixed hyperlipidemia: Secondary | ICD-10-CM | POA: Diagnosis not present

## 2022-10-31 DIAGNOSIS — Z125 Encounter for screening for malignant neoplasm of prostate: Secondary | ICD-10-CM | POA: Diagnosis not present

## 2022-10-31 DIAGNOSIS — I10 Essential (primary) hypertension: Secondary | ICD-10-CM

## 2022-10-31 DIAGNOSIS — I4821 Permanent atrial fibrillation: Secondary | ICD-10-CM

## 2022-10-31 LAB — CBC WITH DIFFERENTIAL/PLATELET
Basophils Absolute: 0 10*3/uL (ref 0.0–0.1)
Basophils Relative: 0.3 % (ref 0.0–3.0)
Eosinophils Absolute: 0.2 10*3/uL (ref 0.0–0.7)
Eosinophils Relative: 2.4 % (ref 0.0–5.0)
HCT: 42.6 % (ref 39.0–52.0)
Hemoglobin: 14.1 g/dL (ref 13.0–17.0)
Lymphocytes Relative: 26.9 % (ref 12.0–46.0)
Lymphs Abs: 2.2 10*3/uL (ref 0.7–4.0)
MCHC: 33.2 g/dL (ref 30.0–36.0)
MCV: 92.6 fl (ref 78.0–100.0)
Monocytes Absolute: 0.6 10*3/uL (ref 0.1–1.0)
Monocytes Relative: 7 % (ref 3.0–12.0)
Neutro Abs: 5.3 10*3/uL (ref 1.4–7.7)
Neutrophils Relative %: 63.4 % (ref 43.0–77.0)
Platelets: 132 10*3/uL — ABNORMAL LOW (ref 150.0–400.0)
RBC: 4.6 Mil/uL (ref 4.22–5.81)
RDW: 13.5 % (ref 11.5–15.5)
WBC: 8.3 10*3/uL (ref 4.0–10.5)

## 2022-10-31 LAB — COMPREHENSIVE METABOLIC PANEL
ALT: 27 U/L (ref 0–53)
AST: 22 U/L (ref 0–37)
Albumin: 4.1 g/dL (ref 3.5–5.2)
Alkaline Phosphatase: 94 U/L (ref 39–117)
BUN: 11 mg/dL (ref 6–23)
CO2: 31 mEq/L (ref 19–32)
Calcium: 8.9 mg/dL (ref 8.4–10.5)
Chloride: 105 mEq/L (ref 96–112)
Creatinine, Ser: 0.84 mg/dL (ref 0.40–1.50)
GFR: 84.12 mL/min (ref >60.00)
Glucose, Bld: 100 mg/dL — ABNORMAL HIGH (ref 70–99)
Potassium: 3.5 mEq/L (ref 3.5–5.1)
Sodium: 144 mEq/L (ref 135–145)
Total Bilirubin: 0.8 mg/dL (ref 0.2–1.2)
Total Protein: 6.9 g/dL (ref 6.0–8.3)

## 2022-10-31 LAB — PSA: PSA: 0.57 ng/mL (ref 0.10–4.00)

## 2022-10-31 LAB — LIPID PANEL
Cholesterol: 132 mg/dL (ref 0–200)
HDL: 42.5 mg/dL (ref >39.00)
LDL Cholesterol: 74 mg/dL (ref 0–99)
NonHDL: 89.37
Total CHOL/HDL Ratio: 3
Triglycerides: 75 mg/dL (ref 0.0–149.0)
VLDL: 15 mg/dL (ref 0.0–40.0)

## 2022-10-31 LAB — TSH: TSH: 0.97 u[IU]/mL (ref 0.35–5.50)

## 2022-10-31 LAB — HEMOGLOBIN A1C: Hgb A1c MFr Bld: 6 % (ref 4.6–6.5)

## 2022-10-31 NOTE — Assessment & Plan Note (Addendum)
Chronic. Stable. Continue Lipitor '80mg'$  daily and Zetia '10mg'$  daily. Encouraged to continue healthy diet. Will check lipids today.

## 2022-10-31 NOTE — Assessment & Plan Note (Addendum)
Will check A1c today. Diet controlled. Encouraged to continue healthy diet and exercise. Will monitor.

## 2022-10-31 NOTE — Assessment & Plan Note (Addendum)
Chronic. Stable. Continue Eliquis '5mg'$  BID, Metoprolol Tart '100mg'$  BID, Plavix '75mg'$  QD and Cardizem '120mg'$  QD. Follow up with Cardiology.

## 2022-10-31 NOTE — Assessment & Plan Note (Addendum)
Chronic. Stable. Continue current regimen. Labs as outlined.

## 2022-10-31 NOTE — Progress Notes (Signed)
Tomasita Morrow, NP-C Phone: [phone]  Carl Alvarez is a 77 y.o. male who presents today for transfer of care. He has no complaints or new concerns today. He is doing well on all of his medications.   HYPERTENSION Disease Monitoring: Blood pressure range- Not checking Chest pain- No      Dyspnea- No Medications: Compliance- Lopressor Lightheadedness- No   Edema- No  PREDIABETES Disease Monitoring: Blood Sugar ranges- Not checking Polyuria/phagia/dipsia- No      Optho- No Medications: Compliance- Diet controlled Hypoglycemic symptoms- No  HYPERLIPIDEMIA Disease Monitoring: See symptoms for Hypertension Medications: Compliance- Lipitor and Zetia  Right upper quadrant pain- No  Muscle aches- No  Lab Results  Component Value Date   NA 143 06/23/2022   K 3.4 (L) 06/23/2022   CO2 27 06/23/2022   GLUCOSE 129 (H) 06/23/2022   BUN 12 06/23/2022   CREATININE 0.86 06/23/2022   CALCIUM 9.1 06/23/2022   GFRNONAA >60 06/23/2022     Lab Results  Component Value Date   CHOL 125 03/09/2021   HDL 43 03/09/2021   LDLCALC 67 03/09/2021   LDLDIRECT 131.9 02/28/2012   TRIG 74 03/09/2021   CHOLHDL 2.9 03/09/2021   Lab Results  Component Value Date   HGBA1C 6.2 (H) 03/09/2021    Social History   Tobacco Use  Smoking Status Former   Packs/day: 1.00   Years: 45.00   Total pack years: 45.00   Types: Cigarettes   Quit date: 09/30/2013   Years since quitting: 9.0  Smokeless Tobacco Never  Tobacco Comments   occ wine    Current Outpatient Medications on File Prior to Visit  Medication Sig Dispense Refill   atorvastatin (LIPITOR) 80 MG tablet TAKE 1 TABLET BY MOUTH EVERY DAY 90 tablet 3   clopidogrel (PLAVIX) 75 MG tablet TAKE 1 TABLET BY MOUTH EVERY DAY 90 tablet 3   diltiazem (CARDIZEM CD) 120 MG 24 hr capsule TAKE 1 CAPSULE BY MOUTH EVERY DAY 90 capsule 3   ELIQUIS 5 MG TABS tablet TAKE 1 TABLET BY MOUTH TWICE A DAY 180 tablet 1   ezetimibe (ZETIA) 10 MG tablet TAKE 1  TABLET BY MOUTH EVERY DAY 90 tablet 2   metoprolol tartrate (LOPRESSOR) 100 MG tablet TAKE 1 TABLET BY MOUTH TWICE A DAY 180 tablet 3   Multiple Vitamin (MULTIVITAMIN) tablet Take 1 tablet by mouth daily.     No current facility-administered medications on file prior to visit.     ROS see history of present illness  Objective  Physical Exam Vitals:   10/31/22 1026  BP: 130/88  Pulse: 86  Temp: 98 F (36.7 C)  SpO2: 95%    BP Readings from Last 3 Encounters:  10/31/22 130/88  07/19/22 (!) 142/88  06/27/22 (!) 169/97   Wt Readings from Last 3 Encounters:  10/31/22 220 lb 12.8 oz (100.2 kg)  07/19/22 223 lb 6.4 oz (101.3 kg)  06/27/22 220 lb 7.4 oz (100 kg)    Physical Exam Constitutional:      General: He is not in acute distress.    Appearance: Normal appearance.  HENT:     Head: Normocephalic.  Cardiovascular:     Rate and Rhythm: Rhythm irregular.     Heart sounds: No murmur heard.    No friction rub. No gallop.  Pulmonary:     Effort: Pulmonary effort is normal.     Breath sounds: Normal breath sounds.  Skin:    General: Skin is warm and dry.  Neurological:     Mental Status: He is alert.  Psychiatric:        Mood and Affect: Mood normal.        Behavior: Behavior normal.     Assessment/Plan: Please see individual problem list.  Prediabetes Assessment & Plan: Will check A1c today. Diet controlled. Encouraged to continue healthy diet and exercise. Will monitor.   Orders: -     Comprehensive metabolic panel -     Hemoglobin A1c  Primary hypertension Assessment & Plan: Chronic. Stable. Continue current regimen. Labs as outlined.  Orders: -     CBC with Differential/Platelet -     Comprehensive metabolic panel  Mixed hyperlipidemia Assessment & Plan: Chronic. Stable. Continue Lipitor '80mg'$  daily and Zetia '10mg'$  daily. Encouraged to continue healthy diet. Will check lipids today.   Orders: -     Lipid panel  Permanent atrial fibrillation  (HCC) Assessment & Plan: Chronic. Stable. Continue Eliquis '5mg'$  BID, Metoprolol Tart '100mg'$  BID, Plavix '75mg'$  QD and Cardizem '120mg'$  QD. Follow up with Cardiology.    Screening PSA (prostate specific antigen) -     PSA  Thyroid disorder screen -     TSH  Need for immunization against influenza -     Flu Vaccine QUAD High Dose(Fluad)    Return in about 6 months (around 05/01/2023).   Tomasita Morrow, NP-C [place]

## 2022-11-02 ENCOUNTER — Telehealth: Payer: Self-pay

## 2022-11-02 NOTE — Telephone Encounter (Signed)
LMOM for pt to CB in regards to labs:   Carl Morrow, NP  Carl Alvarez, CMA Blood counts, TSH, kidney and liver function, and PSA all normal. Cholesterol looks great and his A1c has come down from 6.2 to 6.0. Continue healthy diet and exercise!

## 2022-11-27 ENCOUNTER — Ambulatory Visit (INDEPENDENT_AMBULATORY_CARE_PROVIDER_SITE_OTHER): Payer: Medicare HMO | Admitting: Dermatology

## 2022-11-27 DIAGNOSIS — L820 Inflamed seborrheic keratosis: Secondary | ICD-10-CM

## 2022-11-27 DIAGNOSIS — L821 Other seborrheic keratosis: Secondary | ICD-10-CM

## 2022-11-27 NOTE — Progress Notes (Signed)
   New Patient Visit  Subjective  Carl Alvarez is a 77 y.o. male who presents for the following: Skin Problem (Patient with irritated spots under right and left eye. ). Bothered by his glasses or when he rubs his eyes.  He had similar spots frozen off in the past and they healed well.   The following portions of the chart were reviewed this encounter and updated as appropriate:       Review of Systems:  No other skin or systemic complaints except as noted in HPI or Assessment and Plan.  Objective  Well appearing patient in no apparent distress; mood and affect are within normal limits.  A focused examination was performed including face. Relevant physical exam findings are noted in the Assessment and Plan.  Left Medial Infraocular x 3, Right Medial Infraocular x 3 (6) Erythematous stuck-on, waxy papule     Assessment & Plan  Inflamed seborrheic keratosis (6) Left Medial Infraocular x 3, Right Medial Infraocular x 3  Symptomatic, irritating, patient would like treated.  L medial infraocular x 1- discussed shave removal due to size, pt defers and prefers cryotherapy.  May take more than one treatment to clear.   Destruction of lesion - Left Medial Infraocular x 3, Right Medial Infraocular x 3  Destruction method: cryotherapy   Informed consent: discussed and consent obtained   Lesion destroyed using liquid nitrogen: Yes   Region frozen until ice ball extended beyond lesion: Yes   Outcome: patient tolerated procedure well with no complications   Post-procedure details: wound care instructions given   Additional details:  Prior to procedure, discussed risks of blister formation, small wound, skin dyspigmentation, or rare scar following cryotherapy. Recommend Vaseline ointment to treated areas while healing.    Seborrheic Keratoses - Stuck-on, waxy, tan-brown papules face - Benign-appearing - Discussed benign etiology and prognosis. - Observe - Call for any changes    Return in about 2 months (around 01/26/2023) for ISK follow up.  Graciella Belton, RMA, am acting as scribe for Brendolyn Patty, MD .  Documentation: I have reviewed the above documentation for accuracy and completeness, and I agree with the above.  Brendolyn Patty MD

## 2022-11-27 NOTE — Patient Instructions (Addendum)
Cryotherapy Aftercare  Wash gently with soap and water everyday.   Apply Vaseline daily until healed.     Seborrheic Keratosis  What causes seborrheic keratoses? Seborrheic keratoses are harmless, common skin growths that first appear during adult life.  As time goes by, more growths appear.  Some people may develop a large number of them.  Seborrheic keratoses appear on both covered and uncovered body parts.  They are not caused by sunlight.  The tendency to develop seborrheic keratoses can be inherited.  They vary in color from skin-colored to gray, brown, or even black.  They can be either smooth or have a rough, warty surface.   Seborrheic keratoses are superficial and look as if they were stuck on the skin.  Under the microscope this type of keratosis looks like layers upon layers of skin.  That is why at times the top layer may seem to fall off, but the rest of the growth remains and re-grows.    Treatment Seborrheic keratoses do not need to be treated, but can easily be removed in the office.  Seborrheic keratoses often cause symptoms when they rub on clothing or jewelry.  Lesions can be in the way of shaving.  If they become inflamed, they can cause itching, soreness, or burning.  Removal of a seborrheic keratosis can be accomplished by freezing, burning, or surgery. If any spot bleeds, scabs, or grows rapidly, please return to have it checked, as these can be an indication of a skin cancer.     Due to recent changes in healthcare laws, you may see results of your pathology and/or laboratory studies on MyChart before the doctors have had a chance to review them. We understand that in some cases there may be results that are confusing or concerning to you. Please understand that not all results are received at the same time and often the doctors may need to interpret multiple results in order to provide you with the best plan of care or course of treatment. Therefore, we ask that you  please give us 2 business days to thoroughly review all your results before contacting the office for clarification. Should we see a critical lab result, you will be contacted sooner.   If You Need Anything After Your Visit  If you have any questions or concerns for your doctor, please call our main line at [phone] and press option 4 to reach your doctor's medical assistant. If no one answers, please leave a voicemail as directed and we will return your call as soon as possible. Messages left after 4 pm will be answered the following business day.   You may also send us a message via MyChart. We typically respond to MyChart messages within 1-2 business days.  For prescription refills, please ask your pharmacy to contact our office. Our fax number is [phone].  If you have an urgent issue when the clinic is closed that cannot wait until the next business day, you can page your doctor at the number below.    Please note that while we do our best to be available for urgent issues outside of office hours, we are not available 24/7.   If you have an urgent issue and are unable to reach us, you may choose to seek medical care at your doctor's office, retail clinic, urgent care center, or emergency room.  If you have a medical emergency, please immediately call 911 or go to the emergency department.  Pager Numbers  - Dr. Kowalski:   [phone]  - Dr. Moye: [phone]  - Dr. Stewart: [phone]  In the event of inclement weather, please call our main line at [phone] for an update on the status of any delays or closures.  Dermatology Medication Tips: Please keep the boxes that topical medications come in in order to help keep track of the instructions about where and how to use these. Pharmacies typically print the medication instructions only on the boxes and not directly on the medication tubes.   If your medication is too expensive, please contact our office at  [phone] option 4 or send us a message through MyChart.   We are unable to tell what your co-pay for medications will be in advance as this is different depending on your insurance coverage. However, we may be able to find a substitute medication at lower cost or fill out paperwork to get insurance to cover a needed medication.   If a prior authorization is required to get your medication covered by your insurance company, please allow us 1-2 business days to complete this process.  Drug prices often vary depending on where the prescription is filled and some pharmacies may offer cheaper prices.  The website www.goodrx.com contains coupons for medications through different pharmacies. The prices here do not account for what the cost may be with help from insurance (it may be cheaper with your insurance), but the website can give you the price if you did not use any insurance.  - You can print the associated coupon and take it with your prescription to the pharmacy.  - You may also stop by our office during regular business hours and pick up a GoodRx coupon card.  - If you need your prescription sent electronically to a different pharmacy, notify our office through [facility] MyChart or by phone at [phone] option 4.     Si Usted Necesita Algo Despus de Su Visita  Tambin puede enviarnos un mensaje a travs de MyChart. Por lo general respondemos a los mensajes de MyChart en el transcurso de 1 a 2 das hbiles.  Para renovar recetas, por favor pida a su farmacia que se ponga en contacto con nuestra oficina. Nuestro nmero de fax es el [phone].  Si tiene un asunto urgente cuando la clnica est cerrada y que no puede esperar hasta el siguiente da hbil, puede llamar/localizar a su doctor(a) al nmero que aparece a continuacin.   Por favor, tenga en cuenta que aunque hacemos todo lo posible para estar disponibles para asuntos urgentes fuera del horario de oficina, no estamos  disponibles las 24 horas del da, los 7 das de la semana.   Si tiene un problema urgente y no puede comunicarse con nosotros, puede optar por buscar atencin mdica  en el consultorio de su doctor(a), en una clnica privada, en un centro de atencin urgente o en una sala de emergencias.  Si tiene una emergencia mdica, por favor llame inmediatamente al 911 o vaya a la sala de emergencias.  Nmeros de bper  - Dr. Kowalski: [phone]  - Dra. Moye: [phone]  - Dra. Stewart: [phone]  En caso de inclemencias del tiempo, por favor llame a nuestra lnea principal al [phone] para una actualizacin sobre el estado de cualquier retraso o cierre.  Consejos para la medicacin en dermatologa: Por favor, guarde las cajas en las que vienen los medicamentos de uso tpico para ayudarle a seguir las instrucciones sobre dnde y cmo usarlos. Las farmacias generalmente imprimen las instrucciones del medicamento slo en   las cajas y no directamente en los tubos del medicamento.   Si su medicamento es muy caro, por favor, pngase en contacto con nuestra oficina llamando al [phone] y presione la opcin 4 o envenos un mensaje a travs de MyChart.   No podemos decirle cul ser su copago por los medicamentos por adelantado ya que esto es diferente dependiendo de la cobertura de su seguro. Sin embargo, es posible que podamos encontrar un medicamento sustituto a menor costo o llenar un formulario para que el seguro cubra el medicamento que se considera necesario.   Si se requiere una autorizacin previa para que su compaa de seguros cubra su medicamento, por favor permtanos de 1 a 2 das hbiles para completar este proceso.  Los precios de los medicamentos varan con frecuencia dependiendo del lugar de dnde se surte la receta y alguna farmacias pueden ofrecer precios ms baratos.  El sitio web www.goodrx.com tiene cupones para medicamentos de diferentes farmacias. Los precios aqu no  tienen en cuenta lo que podra costar con la ayuda del seguro (puede ser ms barato con su seguro), pero el sitio web puede darle el precio si no utiliz ningn seguro.  - Puede imprimir el cupn correspondiente y llevarlo con su receta a la farmacia.  - Tambin puede pasar por nuestra oficina durante el horario de atencin regular y recoger una tarjeta de cupones de GoodRx.  - Si necesita que su receta se enve electrnicamente a una farmacia diferente, informe a nuestra oficina a travs de MyChart de [place] o por telfono llamando al [phone] y presione la opcin 4.  

## 2023-01-01 ENCOUNTER — Telehealth: Payer: Self-pay | Admitting: Cardiovascular Disease

## 2023-01-01 ENCOUNTER — Other Ambulatory Visit: Payer: Self-pay | Admitting: Cardiovascular Disease

## 2023-01-01 ENCOUNTER — Other Ambulatory Visit: Payer: Self-pay | Admitting: *Deleted

## 2023-01-01 MED ORDER — APIXABAN 5 MG PO TABS: 5.0000 mg | ORAL_TABLET | Freq: Two times a day (BID) | ORAL | 3 refills | Status: DC

## 2023-01-01 NOTE — Telephone Encounter (Signed)
Would not recommend holding Eliquis for SRP procedure.  If the want medication hold, can re-evaluate

## 2023-01-01 NOTE — Telephone Encounter (Signed)
Please review

## 2023-01-01 NOTE — Telephone Encounter (Signed)
   Pre-operative Risk Assessment    Patient Name: Carl Alvarez  DOB: 1946/08/05 MRN: [id number]     Request for Surgical Clearance    Procedure:   SRP  Date of Surgery:  Clearance TBD                                 Surgeon:  Dr Erasmo Leventhal Group or Practice Name:  [facility] Phone number:  [phone] Fax number:  [phone]   Type of Clearance Requested:   - Medical    Type of Anesthesia:  Not Indicated   Additional requests/questions:    Manfred Arch   01/01/2023, 11:17 AM

## 2023-01-01 NOTE — Telephone Encounter (Signed)
Left message for the patient to contact the office. 

## 2023-01-01 NOTE — Telephone Encounter (Signed)
Pharmacy please advise on holding Eliquis prior to SRP procedure scheduled for TBD. Thank you.

## 2023-01-01 NOTE — Telephone Encounter (Signed)
Callback team,  Please contact provider for guidance on holding patient's Eliquis in addition to medical clearance.  Thank you

## 2023-01-01 NOTE — Telephone Encounter (Addendum)
Requesting provider would like instructions on holding Eliquis as well.

## 2023-01-01 NOTE — Telephone Encounter (Signed)
   Name: Carl Alvarez  DOB: Feb 18, 1946  MRN: [id number]  Primary Cardiologist: Ida Rogue, MD   Preoperative team, please contact this patient and set up a phone call appointment for further preoperative risk assessment. Please obtain consent and complete medication review. Thank you for your help.  I confirm that guidance regarding antiplatelet and oral anticoagulation therapy has been completed and, if necessary, noted below.  Would not recommend holding Eliquis for SRP procedure. If the want medication hold, can re-evaluate    Mable Fill, Marissa Nestle, NP 01/01/2023, 2:49 PM [place]

## 2023-01-02 NOTE — Telephone Encounter (Signed)
2nd attempt to reach pt regarding surgical clearance and the need for a tele visit.  Left message for pt to call back and ask for the preop team

## 2023-01-03 NOTE — Telephone Encounter (Signed)
Our office has tried x 3 to reach pt to set up tele pre op appt. I will send FYI to requesting office the pt needs tele visit. Will remove from the pre op call back pool until pt calls back.

## 2023-01-09 ENCOUNTER — Other Ambulatory Visit: Payer: Self-pay

## 2023-01-09 ENCOUNTER — Emergency Department: Payer: Medicare HMO

## 2023-01-09 ENCOUNTER — Encounter: Payer: Self-pay | Admitting: Emergency Medicine

## 2023-01-09 ENCOUNTER — Emergency Department
Admission: EM | Admit: 2023-01-09 | Discharge: 2023-01-09 | Disposition: A | Discharge status: Home / Self Care | Payer: Medicare HMO | Attending: Emergency Medicine | Admitting: Emergency Medicine

## 2023-01-09 DIAGNOSIS — R197 Diarrhea, unspecified: Secondary | ICD-10-CM | POA: Diagnosis not present

## 2023-01-09 DIAGNOSIS — R109 Unspecified abdominal pain: Secondary | ICD-10-CM | POA: Insufficient documentation

## 2023-01-09 DIAGNOSIS — R110 Nausea: Secondary | ICD-10-CM | POA: Diagnosis not present

## 2023-01-09 DIAGNOSIS — Z20822 Contact with and (suspected) exposure to covid-19: Secondary | ICD-10-CM | POA: Insufficient documentation

## 2023-01-09 DIAGNOSIS — I700 Atherosclerosis of aorta: Secondary | ICD-10-CM | POA: Diagnosis not present

## 2023-01-09 LAB — CBC
HCT: 47.4 % (ref 39.0–52.0)
Hemoglobin: 15 g/dL (ref 13.0–17.0)
MCH: 29.7 pg (ref 26.0–34.0)
MCHC: 31.6 g/dL (ref 30.0–36.0)
MCV: 93.9 fL (ref 80.0–100.0)
Platelets: 145 10*3/uL — ABNORMAL LOW (ref 150–400)
RBC: 5.05 MIL/uL (ref 4.22–5.81)
RDW: 12.7 % (ref 11.5–15.5)
WBC: 7.9 10*3/uL (ref 4.0–10.5)
nRBC: 0 % (ref 0.0–0.2)

## 2023-01-09 LAB — COMPREHENSIVE METABOLIC PANEL
ALT: 21 U/L (ref 0–44)
AST: 19 U/L (ref 15–41)
Albumin: 4.1 g/dL (ref 3.5–5.0)
Alkaline Phosphatase: 110 U/L (ref 38–126)
Anion gap: 6 (ref 5–15)
BUN: 14 mg/dL (ref 8–23)
CO2: 26 mmol/L (ref 22–32)
Calcium: 9 mg/dL (ref 8.9–10.3)
Chloride: 107 mmol/L (ref 98–111)
Creatinine, Ser: 0.91 mg/dL (ref 0.61–1.24)
GFR, Estimated: >60 mL/min (ref >60)
Glucose, Bld: 111 mg/dL — ABNORMAL HIGH (ref 70–99)
Potassium: 4.3 mmol/L (ref 3.5–5.1)
Sodium: 139 mmol/L (ref 135–145)
Total Bilirubin: 1.2 mg/dL (ref 0.3–1.2)
Total Protein: 7.8 g/dL (ref 6.5–8.1)

## 2023-01-09 LAB — RESP PANEL BY RT-PCR (RSV, FLU A&B, COVID)  RVPGX2
Influenza A by PCR: NEGATIVE
Influenza B by PCR: NEGATIVE
Resp Syncytial Virus by PCR: NEGATIVE
SARS Coronavirus 2 by RT PCR: NEGATIVE

## 2023-01-09 LAB — LIPASE, BLOOD: Lipase: 35 U/L (ref 11–51)

## 2023-01-09 MED ORDER — PANTOPRAZOLE SODIUM 20 MG PO TBEC: 20.0000 mg | DELAYED_RELEASE_TABLET | Freq: Every day | ORAL | 0 refills | Status: AC

## 2023-01-09 MED ORDER — IOHEXOL 300 MG/ML  SOLN
100.0000 mL | Freq: Once | INTRAMUSCULAR | Status: AC | PRN
  Administered 2023-01-09: 100 mL via INTRAVENOUS

## 2023-01-09 NOTE — ED Notes (Signed)
59 yom with a c/c of diarrhea and his stomach not feeling well for the past 2 weeks. The pt is warm, pink, and dry. The pt was alert and oriented x 4. The pt was ambulatory to the room.

## 2023-01-09 NOTE — Discharge Instructions (Signed)
Follow-up with your primary care provider if any continued problems or continued diarrhea.  Also look into changing your diet with less fried foods.  Begin taking Protonix 1 daily for reflux and make an appointment with the gastroenterologist listed on your discharge papers for further evaluation.

## 2023-01-09 NOTE — ED Triage Notes (Signed)
Pt to ED via POV c/o diarrhea and nausea. Pt states that he has also had decreased appetite. Pt denies vomiting or fever. Pt is currently in NAD.

## 2023-01-09 NOTE — ED Provider Notes (Signed)
[facility] Provider Note    Event Date/Time   First MD Initiated Contact with Patient 01/09/23 [phone]     (approximate)   History   Diarrhea and Nausea   HPI  Carl Alvarez is a 77 y.o. male presents to the ED with complaint of nausea and diarrhea that initially started approximately 3 weeks ago.  Patient states that at that time he felt bad and thought he probably had COVID.  He is unaware of any fever and denies chills.  He has continued to have nausea without vomiting and reports approximately 4-5 diarrhea stools per day.  Patient states he has been taking Maalox daily to try and control his diarrhea which also does not seem to be helping.  In the past he has had some reflux issues but currently is not on any medication.     Physical Exam   Triage Vital Signs: ED Triage Vitals  Enc Vitals Group     BP 01/09/23 0840 (!) 127/98     Pulse Rate 01/09/23 0840 86     Resp 01/09/23 0840 17     Temp 01/09/23 0840 97.9 F (36.6 C)     Temp Source 01/09/23 0840 Oral     SpO2 01/09/23 0840 98 %     Weight 01/09/23 0900 225 lb (102.1 kg)     Height 01/09/23 0900 5\' 9"  (1.753 m)     Head Circumference --      Peak Flow --      Pain Score 01/09/23 0900 0     Pain Loc --      Pain Edu? --      Excl. in [place]? --     Most recent vital signs: Vitals:   01/09/23 0840  BP: (!) 127/98  Pulse: 86  Resp: 17  Temp: 97.9 F (36.6 C)  SpO2: 98%     General: Awake, no distress.  Alert, talkative. CV:  Good peripheral perfusion.  Heart regular rate and rhythm. Resp:  Normal effort.  Lungs are clear bilaterally. Abd:  No distention.  Abdomen soft, nontender, bowel sounds are hyperactive x 4 quadrants. Other:     ED Results / Procedures / Treatments   Labs (all labs ordered are listed, but only abnormal results are displayed) Labs Reviewed  COMPREHENSIVE METABOLIC PANEL - Abnormal; Notable for the following components:      Result Value   Glucose, Bld  111 (*)    All other components within normal limits  CBC - Abnormal; Notable for the following components:   Platelets 145 (*)    All other components within normal limits  RESP PANEL BY RT-PCR (RSV, FLU A&B, COVID)  RVPGX2  LIPASE, BLOOD  URINALYSIS, ROUTINE W REFLEX MICROSCOPIC      RADIOLOGY CT abdomen pelvis with contrast per radiologist is negative for acute changes but diverticulosis noted and patient has a history of same.    PROCEDURES:  Critical Care performed:   Procedures   MEDICATIONS ORDERED IN ED: Medications  iohexol (OMNIPAQUE) 300 MG/ML solution 100 mL (100 mLs Intravenous Contrast Given 01/09/23 1111)     IMPRESSION / MDM / ASSESSMENT AND PLAN / ED COURSE  I reviewed the triage vital signs and the nursing notes.   Differential diagnosis includes, but is not limited to, diarrhea etiology uncertain, viral enteritis, COVID, influenza.  77 year old male presents to the ED with complaint of diarrhea that started approximately 3 weeks ago when he thought he had COVID.  Patient was made aware that his respiratory panel is negative and that his lab work is unremarkable.  Lipase was 35 and his CT scan showed his known diverticulosis but not diverticulitis.  When talking with him about his results he noted that the diarrhea started at the same time he began eating at [place] [employer] every night.  He states that a lot of times after he finished eating there he would barely get home before having diarrhea.  We discussed a change in diet that does not include fried foods or fatty foods or foods high in butter to see if this will help with his diarrhea.  Also a prescription for Protonix was sent to the pharmacy for him to try and also encouraged him to discontinue taking the Maalox which may be adding to his diarrhea.      Patient's presentation is most consistent with acute complicated illness / injury requiring diagnostic workup.  FINAL CLINICAL IMPRESSION(S) / ED  DIAGNOSES   Final diagnoses:  Diarrhea, unspecified type     Rx / DC Orders   ED Discharge Orders          Ordered    pantoprazole (PROTONIX) 20 MG tablet  Daily        01/09/23 1239             Note:  This document was prepared using Dragon voice recognition software and may include unintentional dictation errors.   Johnn Hai, PA-C 01/09/23 1434    Vanessa [place], MD 01/13/23 [phone]

## 2023-01-15 ENCOUNTER — Telehealth: Payer: Self-pay

## 2023-01-15 NOTE — Telephone Encounter (Signed)
        Patient  visited [facility] on 01/09/2023  for Diarrhea  Nausea.   Telephone encounter attempt :  1st  A HIPAA compliant voice message was left requesting a return call.  Instructed patient to call back at [phone].   [place] Resource Care Guide   ??[email or web address]  ?? [email or web address]   Website: [email or web address]  [email or web address]

## 2023-01-16 ENCOUNTER — Telehealth: Payer: Self-pay

## 2023-01-16 NOTE — Telephone Encounter (Signed)
        Patient  visited [facility] on 01/09/2023  for Diarrhea  Nausea.   Telephone encounter attempt :  2nd  A HIPAA compliant voice message was left requesting a return call.  Instructed patient to call back at [phone].   [place] Resource Care Guide   [email or web address]  ?? [id number]   Website: [email or web address]  [email or web address]

## 2023-01-30 ENCOUNTER — Telehealth: Payer: Self-pay | Admitting: Nurse Practitioner

## 2023-01-30 NOTE — Telephone Encounter (Signed)
Copied from CRM [id number]. Topic: Medicare AWV >> Jan 30, 2023  3:06 PM Rushie Goltz wrote: Reason for CRM: Called patient to schedule Medicare Annual Wellness Visit (AWV). Left message for patient to call back and schedule Medicare Annual Wellness Visit (AWV).  Last date of AWV: 11/02/2021  Please schedule an AWVS appointment at any time with [facility] VISIT.  If any questions, please contact me at [phone].   Thank you,  [facility] Support [facility] Direct dial  [phone]

## 2023-02-06 ENCOUNTER — Ambulatory Visit: Payer: Medicare HMO | Admitting: Dermatology

## 2023-02-12 ENCOUNTER — Telehealth: Payer: Self-pay | Admitting: *Deleted

## 2023-02-12 NOTE — Telephone Encounter (Signed)
Pt's daughter is calling to set up tele pre op appt.

## 2023-02-12 NOTE — Telephone Encounter (Signed)
I s/w the pt's daughter Carl Alvarez [facility]) who scheduled a tele pre op appt for the pt 02/16/23 :20. Med rec and consent are done.

## 2023-02-12 NOTE — Telephone Encounter (Signed)
I s/w the pt's daughter Carl Alvarez [facility]) who scheduled a tele pre op appt for the pt 02/16/23 :20. Med rec and consent are done.     Patient Consent for Virtual Visit        Carl Alvarez has provided verbal consent on 02/12/2023 for a virtual visit (video or telephone).   CONSENT FOR VIRTUAL VISIT FOR:  Carl Alvarez  By participating in this virtual visit I agree to the following:  I hereby voluntarily request, consent and authorize [facility] and its employed or contracted physicians, physician assistants, nurse practitioners or other licensed health care professionals (the Practitioner), to provide me with telemedicine health care services (the "Services") as deemed necessary by the treating Practitioner. I acknowledge and consent to receive the Services by the Practitioner via telemedicine. I understand that the telemedicine visit will involve communicating with the Practitioner through live audiovisual communication technology and the disclosure of certain medical information by electronic transmission. I acknowledge that I have been given the opportunity to request an in-person assessment or other available alternative prior to the telemedicine visit and am voluntarily participating in the telemedicine visit.  I understand that I have the right to withhold or withdraw my consent to the use of telemedicine in the course of my care at any time, without affecting my right to future care or treatment, and that the Practitioner or I may terminate the telemedicine visit at any time. I understand that I have the right to inspect all information obtained and/or recorded in the course of the telemedicine visit and may receive copies of available information for a reasonable fee.  I understand that some of the potential risks of receiving the Services via telemedicine include:  Delay or interruption in medical evaluation due to technological equipment failure or disruption; Information  transmitted may not be sufficient (e.g. poor resolution of images) to allow for appropriate medical decision making by the Practitioner; and/or  In rare instances, security protocols could fail, causing a breach of personal health information.  Furthermore, I acknowledge that it is my responsibility to provide information about my medical history, conditions and care that is complete and accurate to the best of my ability. I acknowledge that Practitioner's advice, recommendations, and/or decision may be based on factors not within their control, such as incomplete or inaccurate data provided by me or distortions of diagnostic images or specimens that may result from electronic transmissions. I understand that the practice of medicine is not an exact science and that Practitioner makes no warranties or guarantees regarding treatment outcomes. I acknowledge that a copy of this consent can be made available to me via my patient portal [name], or I can request a printed copy by calling the office of [facility].    I understand that my insurance will be billed for this visit.   I have read or had this consent read to me. I understand the contents of this consent, which adequately explains the benefits and risks of the Services being provided via telemedicine.  I have been provided ample opportunity to ask questions regarding this consent and the Services and have had my questions answered to my satisfaction. I give my informed consent for the services to be provided through the use of telemedicine in my medical care

## 2023-02-16 ENCOUNTER — Ambulatory Visit: Payer: Medicare HMO | Attending: Cardiovascular Disease | Admitting: Nurse Practitioner

## 2023-02-16 DIAGNOSIS — Z01810 Encounter for preprocedural cardiovascular examination: Secondary | ICD-10-CM | POA: Diagnosis not present
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
## 2023-02-16 NOTE — Telephone Encounter (Signed)
Pt has history of afib and prior stroke and TIA, would prefer minimal time off anticoagulation. Would see if they are ok with pt skipping 1 dose of Eliquis prior to dental work.

## 2023-04-16 ENCOUNTER — Ambulatory Visit: Payer: Medicare HMO | Admitting: Dermatology

## 2023-05-10 ENCOUNTER — Encounter: Payer: Self-pay | Admitting: Cardiovascular Disease

## 2023-05-28 NOTE — Telephone Encounter (Signed)
Pt came by office states that Dental Office still has not received clearance. Attempted to call Dental Office and no answer and left voice mail. Pt is going to Dental Office to speak with them.

## 2023-06-05 ENCOUNTER — Telehealth: Payer: Self-pay | Admitting: Cardiovascular Disease

## 2023-06-05 NOTE — Telephone Encounter (Signed)
Left message to call back to scheduled in office appt for pre op clearance. Per pre op APP pt is due soon for yrly f/u as well.

## 2023-06-05 NOTE — Telephone Encounter (Signed)
Patient with diagnosis of afib on Eliquis for anticoagulation.    Procedure: 5 extractions, periodontal scaling/root planing, radiographs, fillings, crowns, bridges, root canal therapy   Date of procedure: 06/20/23  CHA2DS2-VASc Score = 6  This indicates a 9.7% annual risk of stroke. The patient's score is based upon: CHF History: 0 HTN History: 1 Diabetes History: 0 Stroke History: 2 Vascular Disease History: 1 Age Score: 2 Gender Score: 0   Stroke 02/2021, TIA 11/2011  CrCl 84mL/min using adjusted body weight Platelet count 145K  Patient does not require pre-op antibiotics for dental procedure.  Per office protocol, patient can hold Eliquis for 1 day prior to procedure.    **This guidance is not considered finalized until pre-operative APP has relayed final recommendations.**

## 2023-06-05 NOTE — Telephone Encounter (Signed)
   Name: Carl Alvarez  DOB: 01-30-1946  MRN: [id number]  Primary Cardiologist: Julien Nordmann, MD  Chart reviewed as part of pre-operative protocol coverage. Because of Carl Alvarez past medical history and time since last visit, he will require a follow-up in-office visit in order to better assess preoperative cardiovascular risk.  Pre-op covering staff: - Please schedule appointment and call patient to inform them. If patient already had an upcoming appointment within acceptable timeframe, please add "pre-op clearance" to the appointment notes so provider is aware. - Please contact requesting surgeon's office via preferred method (i.e, phone, fax) to inform them of need for appointment prior to surgery.  Patient does not require pre-op antibiotics for dental procedure.   Per office protocol, patient can hold Eliquis for 1 day prior to procedure.  Sharlene Dory, PA-C  06/05/2023, 2:48 PM

## 2023-06-05 NOTE — Telephone Encounter (Signed)
   Pre-operative Risk Assessment    Patient Name: Carl Alvarez  DOB: [birth year] MRN: [id number]     Request for Surgical Clearance    Procedure:  Dental Extraction - Amount of Teeth to be Pulled:  5 extractions, periodontal scaling/root planing, radiographs, fillings, crowns, bridges, root canal therapy  Date of Surgery:  Clearance 06/20/23                                 Surgeon:  Dr Particia Nearing Group or Practice Name:  [facility] Phone number:  [phone] Fax number:  [phone]   Type of Clearance Requested:   - Medical    Type of Anesthesia:   nitrous oxide, local anesthetic with epinephrine   Additional requests/questions:  Please advise surgeon/provider what medications should be held.  Courtney Heys   06/05/2023, 1:43 PM

## 2023-06-06 NOTE — Telephone Encounter (Signed)
2ND attempt to reach pt regarding surgical clearance and the need for an IN OFFICE appointment.  Left pt a detailed message to call back and get that scheduled.

## 2023-06-07 ENCOUNTER — Telehealth: Payer: Self-pay | Admitting: Cardiovascular Disease

## 2023-06-07 ENCOUNTER — Other Ambulatory Visit: Payer: Self-pay | Admitting: Cardiovascular Disease

## 2023-06-07 NOTE — Telephone Encounter (Signed)
Please contact pt for future appointment. Pt due for f/u 06/2023.

## 2023-06-07 NOTE — Telephone Encounter (Signed)
3rd attempt, vm if full could not leave a message to call back. I will update requesting office the pt needs in office appt before being cleared. I will remove from the pre op call back pool. Pt needs to call back to schedule an in office appt.

## 2023-06-07 NOTE — Telephone Encounter (Signed)
Mailbox full, unable to leave voicemail

## 2023-06-07 NOTE — Telephone Encounter (Signed)
Mailbox is full, unable to leave message to schedule appt from recall

## 2023-06-11 ENCOUNTER — Telehealth: Payer: Self-pay | Admitting: Cardiovascular Disease

## 2023-06-11 NOTE — Telephone Encounter (Signed)
Unable to leave voicemail due to mailbox being full.

## 2023-06-11 NOTE — Telephone Encounter (Signed)
Unable to leave voicemail to schedule 12 mo follow up from recall. Mailbox is full.

## 2023-06-12 ENCOUNTER — Encounter: Payer: Self-pay | Admitting: Cardiovascular Disease

## 2023-06-12 ENCOUNTER — Telehealth: Payer: Self-pay | Admitting: Cardiovascular Disease

## 2023-06-12 NOTE — Telephone Encounter (Signed)
Called 3x, unable to leave voicemail due to mailbox being full. Unable to reach letter is being sent to patient via mail.

## 2023-06-12 NOTE — Telephone Encounter (Signed)
Called 3x, unable to leave voicemail. Unable to reach letter being sent via mail

## 2023-06-15 NOTE — Telephone Encounter (Signed)
Patient came into office to schedule pre op appt, 1st available is 9/10 @ 220pm w/ Eula Listen also put on waitlist

## 2023-06-18 NOTE — Telephone Encounter (Signed)
I will update all parties involved the pt has appt with Eula Listen, [facility] 07/03/23. Procedure will need to be post poned until the pt has been cleared by cardiology. Please see all notes.

## 2023-06-22 ENCOUNTER — Ambulatory Visit: Payer: Medicare HMO | Attending: Physician Assistant | Admitting: Physician Assistant

## 2023-06-22 ENCOUNTER — Encounter: Payer: Self-pay | Admitting: Physician Assistant

## 2023-06-22 VITALS — BP 136/86 | HR 86 | Ht 71.0 in | Wt 215.4 lb

## 2023-06-22 DIAGNOSIS — I10 Essential (primary) hypertension: Secondary | ICD-10-CM | POA: Diagnosis not present

## 2023-06-22 DIAGNOSIS — Z01810 Encounter for preprocedural cardiovascular examination: Secondary | ICD-10-CM

## 2023-06-22 DIAGNOSIS — I4821 Permanent atrial fibrillation: Secondary | ICD-10-CM | POA: Diagnosis not present

## 2023-06-22 DIAGNOSIS — E785 Hyperlipidemia, unspecified: Secondary | ICD-10-CM

## 2023-06-22 DIAGNOSIS — I4892 Unspecified atrial flutter: Secondary | ICD-10-CM

## 2023-06-22 DIAGNOSIS — I2510 Atherosclerotic heart disease of native coronary artery without angina pectoris: Secondary | ICD-10-CM | POA: Diagnosis not present

## 2023-06-22 DIAGNOSIS — I639 Cerebral infarction, unspecified: Secondary | ICD-10-CM | POA: Diagnosis not present
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
## 2023-06-22 NOTE — Patient Instructions (Signed)
Medication Instructions:  Your physician has recommended you make the following change in your medication:   HOLD Plavix 5 days before your procedure HOLD Eliquis 1 day before your procedure  *If you need a refill on your cardiac medications before your next appointment, please call your pharmacy*   Lab Work: None  If you have labs (blood work) drawn today and your tests are completely normal, you will receive your results only by: MyChart Message (if you have MyChart) OR A paper copy in the mail If you have any lab test that is abnormal or we need to change your treatment, we will call you to review the results.   Testing/Procedures: None   Follow-Up: At [facility], you and your health needs are our priority.  As part of our continuing mission to provide you with exceptional heart care, we have created designated Provider Care Teams.  These Care Teams include your primary Cardiologist (physician) and Advanced Practice Providers (APPs -  Physician Assistants and Nurse Practitioners) who all work together to provide you with the care you need, when you need it.   Your next appointment:   1 year(s)  Provider:   Julien Nordmann, MD or Eula Listen, PA-C

## 2023-06-26 NOTE — Telephone Encounter (Signed)
At first pt stated that he had a dentist appt Friday. Then he stated that he had his clearance appt this past Friday 08/30. I reviewed pts chart and let him kno3w that yes he was cleared for his extractions on 8/30 and that should have been sent to his dentist. He stated that he already stopped taking the Plavix. Then in the same sentence stated he does not have a dentist appt coming up could I schedule that. I told him that I would call dentist office. I then called the dentist. Receptionist stated that pt does not have an upcoming appt and they did receive his clearance. They would be reaching out today to schedule that.  I then called pt back and let him know that he should take his medications as prescribed, until 5 days before his procedure he should stop the Plavix and 1 day hold for Eliquis. He voiced understanding and will hold when acceptable.

## 2023-06-26 NOTE — Telephone Encounter (Signed)
Pt called in frustrated about this clearance. He stated no one has gotten back to the dental office about the clearance.   I assured him he was cleared for the extraction and will send preop a message to get the notes faxed over.

## 2023-07-03 ENCOUNTER — Ambulatory Visit: Payer: Medicare HMO | Admitting: Physician Assistant

## 2023-07-23 ENCOUNTER — Telehealth: Payer: Self-pay | Admitting: Nurse Practitioner

## 2023-07-23 NOTE — Telephone Encounter (Signed)
Copied from CRM [id number]. Topic: Medicare AWV >> Jul 23, 2023 10:26 AM Payton Doughty wrote: Reason for CRM: Called LM 07/23/2023 to schedule AWV   Verlee Rossetti; Care Guide Ambulatory Clinical Support [place] [facility] Direct Dial: [phone]

## 2023-08-09 ENCOUNTER — Other Ambulatory Visit: Payer: Self-pay | Admitting: Cardiovascular Disease

## 2023-09-08 ENCOUNTER — Other Ambulatory Visit: Payer: Self-pay | Admitting: Cardiovascular Disease

## 2023-09-12 ENCOUNTER — Encounter: Payer: Self-pay | Admitting: Internal Medicine

## 2023-10-23 ENCOUNTER — Emergency Department
Admission: EM | Admit: 2023-10-23 | Discharge: 2023-10-23 | Disposition: A | Discharge status: Home / Self Care | Payer: Medicare HMO | Attending: Emergency Medicine | Admitting: Emergency Medicine

## 2023-10-23 ENCOUNTER — Emergency Department: Payer: Medicare HMO

## 2023-10-23 ENCOUNTER — Other Ambulatory Visit: Payer: Self-pay

## 2023-10-23 DIAGNOSIS — I700 Atherosclerosis of aorta: Secondary | ICD-10-CM | POA: Insufficient documentation

## 2023-10-23 DIAGNOSIS — R42 Dizziness and giddiness: Secondary | ICD-10-CM | POA: Diagnosis not present

## 2023-10-23 DIAGNOSIS — G4489 Other headache syndrome: Secondary | ICD-10-CM | POA: Diagnosis not present

## 2023-10-23 DIAGNOSIS — G9389 Other specified disorders of brain: Secondary | ICD-10-CM | POA: Diagnosis not present

## 2023-10-23 DIAGNOSIS — I4891 Unspecified atrial fibrillation: Secondary | ICD-10-CM | POA: Insufficient documentation

## 2023-10-23 DIAGNOSIS — E785 Hyperlipidemia, unspecified: Secondary | ICD-10-CM | POA: Diagnosis not present

## 2023-10-23 DIAGNOSIS — T50901A Poisoning by unspecified drugs, medicaments and biological substances, accidental (unintentional), initial encounter: Secondary | ICD-10-CM | POA: Diagnosis not present

## 2023-10-23 DIAGNOSIS — Z7901 Long term (current) use of anticoagulants: Secondary | ICD-10-CM | POA: Diagnosis not present

## 2023-10-23 DIAGNOSIS — R4182 Altered mental status, unspecified: Secondary | ICD-10-CM | POA: Diagnosis present

## 2023-10-23 DIAGNOSIS — Z8673 Personal history of transient ischemic attack (TIA), and cerebral infarction without residual deficits: Secondary | ICD-10-CM | POA: Diagnosis not present

## 2023-10-23 DIAGNOSIS — I10 Essential (primary) hypertension: Secondary | ICD-10-CM | POA: Diagnosis not present

## 2023-10-23 DIAGNOSIS — Z1152 Encounter for screening for COVID-19: Secondary | ICD-10-CM | POA: Insufficient documentation

## 2023-10-23 DIAGNOSIS — I6782 Cerebral ischemia: Secondary | ICD-10-CM | POA: Insufficient documentation

## 2023-10-23 DIAGNOSIS — R519 Headache, unspecified: Secondary | ICD-10-CM | POA: Insufficient documentation

## 2023-10-23 LAB — URINALYSIS, ROUTINE W REFLEX MICROSCOPIC
Bacteria, UA: NONE SEEN
Bilirubin Urine: NEGATIVE
Glucose, UA: NEGATIVE mg/dL
Ketones, ur: NEGATIVE mg/dL
Leukocytes,Ua: NEGATIVE
Nitrite: NEGATIVE
Protein, ur: 100 mg/dL — AB
Specific Gravity, Urine: 1.013 (ref 1.005–1.030)
Squamous Epithelial / HPF: 0 /[HPF] (ref 0–5)
pH: 5 (ref 5.0–8.0)

## 2023-10-23 LAB — CBC WITH DIFFERENTIAL/PLATELET
Abs Immature Granulocytes: 0.02 10*3/uL (ref 0.00–0.07)
Basophils Absolute: 0 10*3/uL (ref 0.0–0.1)
Basophils Relative: 0 %
Eosinophils Absolute: 0.1 10*3/uL (ref 0.0–0.5)
Eosinophils Relative: 1 %
HCT: 45 % (ref 39.0–52.0)
Hemoglobin: 14.8 g/dL (ref 13.0–17.0)
Immature Granulocytes: 0 %
Lymphocytes Relative: 19 %
Lymphs Abs: 1.7 10*3/uL (ref 0.7–4.0)
MCH: 30.4 pg (ref 26.0–34.0)
MCHC: 32.9 g/dL (ref 30.0–36.0)
MCV: 92.4 fL (ref 80.0–100.0)
Monocytes Absolute: 0.6 10*3/uL (ref 0.1–1.0)
Monocytes Relative: 7 %
Neutro Abs: 6.2 10*3/uL (ref 1.7–7.7)
Neutrophils Relative %: 73 %
Platelets: 129 10*3/uL — ABNORMAL LOW (ref 150–400)
RBC: 4.87 MIL/uL (ref 4.22–5.81)
RDW: 12.7 % (ref 11.5–15.5)
WBC: 8.6 10*3/uL (ref 4.0–10.5)
nRBC: 0 % (ref 0.0–0.2)

## 2023-10-23 LAB — RESP PANEL BY RT-PCR (RSV, FLU A&B, COVID)  RVPGX2
Influenza A by PCR: NEGATIVE
Influenza B by PCR: NEGATIVE
Resp Syncytial Virus by PCR: NEGATIVE
SARS Coronavirus 2 by RT PCR: NEGATIVE

## 2023-10-23 LAB — TROPONIN I (HIGH SENSITIVITY): Troponin I (High Sensitivity): 6 ng/L (ref <18)

## 2023-10-23 LAB — BASIC METABOLIC PANEL
Anion gap: 10 (ref 5–15)
BUN: 8 mg/dL (ref 8–23)
CO2: 28 mmol/L (ref 22–32)
Calcium: 9.1 mg/dL (ref 8.9–10.3)
Chloride: 99 mmol/L (ref 98–111)
Creatinine, Ser: 0.81 mg/dL (ref 0.61–1.24)
GFR, Estimated: >60 mL/min (ref >60)
Glucose, Bld: 108 mg/dL — ABNORMAL HIGH (ref 70–99)
Potassium: 3.3 mmol/L — ABNORMAL LOW (ref 3.5–5.1)
Sodium: 137 mmol/L (ref 135–145)

## 2023-10-23 LAB — ETHANOL: Alcohol, Ethyl (B): <10 mg/dL (ref <10)

## 2023-10-23 LAB — SALICYLATE LEVEL: Salicylate Lvl: <7 mg/dL — ABNORMAL LOW (ref 7.0–30.0)

## 2023-10-23 LAB — ACETAMINOPHEN LEVEL: Acetaminophen (Tylenol), Serum: <10 ug/mL — ABNORMAL LOW (ref 10–30)
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
## 2023-10-24 ENCOUNTER — Other Ambulatory Visit: Payer: Self-pay | Admitting: Cardiovascular Disease

## 2023-10-25 ENCOUNTER — Emergency Department: Payer: Medicare HMO

## 2023-10-25 ENCOUNTER — Other Ambulatory Visit: Payer: Self-pay

## 2023-10-25 ENCOUNTER — Emergency Department
Admission: EM | Admit: 2023-10-25 | Discharge: 2023-10-25 | Disposition: A | Discharge status: Home / Self Care | Payer: Medicare HMO | Attending: Emergency Medicine | Admitting: Emergency Medicine

## 2023-10-25 ENCOUNTER — Emergency Department
Admission: EM | Admit: 2023-10-25 | Discharge: 2023-10-25 | Disposition: A | Discharge status: Home / Self Care | Payer: Medicare HMO | Source: Home / Self Care | Attending: Emergency Medicine | Admitting: Emergency Medicine

## 2023-10-25 ENCOUNTER — Encounter: Payer: Self-pay | Admitting: Emergency Medicine

## 2023-10-25 DIAGNOSIS — S6992XA Unspecified injury of left wrist, hand and finger(s), initial encounter: Secondary | ICD-10-CM | POA: Diagnosis present

## 2023-10-25 DIAGNOSIS — I10 Essential (primary) hypertension: Secondary | ICD-10-CM | POA: Insufficient documentation

## 2023-10-25 DIAGNOSIS — E876 Hypokalemia: Secondary | ICD-10-CM | POA: Diagnosis not present

## 2023-10-25 DIAGNOSIS — Z8673 Personal history of transient ischemic attack (TIA), and cerebral infarction without residual deficits: Secondary | ICD-10-CM | POA: Diagnosis not present

## 2023-10-25 DIAGNOSIS — R002 Palpitations: Secondary | ICD-10-CM | POA: Diagnosis present

## 2023-10-25 DIAGNOSIS — S62317A Displaced fracture of base of fifth metacarpal bone. left hand, initial encounter for closed fracture: Secondary | ICD-10-CM | POA: Diagnosis not present

## 2023-10-25 DIAGNOSIS — W01198A Fall on same level from slipping, tripping and stumbling with subsequent striking against other object, initial encounter: Secondary | ICD-10-CM | POA: Insufficient documentation

## 2023-10-25 DIAGNOSIS — Y92481 Parking lot as the place of occurrence of the external cause: Secondary | ICD-10-CM | POA: Insufficient documentation

## 2023-10-25 DIAGNOSIS — Z23 Encounter for immunization: Secondary | ICD-10-CM | POA: Insufficient documentation

## 2023-10-25 DIAGNOSIS — I4891 Unspecified atrial fibrillation: Secondary | ICD-10-CM | POA: Diagnosis not present

## 2023-10-25 DIAGNOSIS — S0990XA Unspecified injury of head, initial encounter: Secondary | ICD-10-CM | POA: Insufficient documentation

## 2023-10-25 DIAGNOSIS — Z7901 Long term (current) use of anticoagulants: Secondary | ICD-10-CM | POA: Insufficient documentation

## 2023-10-25 DIAGNOSIS — R0789 Other chest pain: Secondary | ICD-10-CM | POA: Diagnosis not present

## 2023-10-25 LAB — CBC WITH DIFFERENTIAL/PLATELET
Abs Immature Granulocytes: 0.03 10*3/uL (ref 0.00–0.07)
Basophils Absolute: 0 10*3/uL (ref 0.0–0.1)
Basophils Relative: 0 %
Eosinophils Absolute: 0.1 10*3/uL (ref 0.0–0.5)
Eosinophils Relative: 1 %
HCT: 44.8 % (ref 39.0–52.0)
Hemoglobin: 14.9 g/dL (ref 13.0–17.0)
Immature Granulocytes: 0 %
Lymphocytes Relative: 19 %
Lymphs Abs: 1.7 10*3/uL (ref 0.7–4.0)
MCH: 30.7 pg (ref 26.0–34.0)
MCHC: 33.3 g/dL (ref 30.0–36.0)
MCV: 92.2 fL (ref 80.0–100.0)
Monocytes Absolute: 0.6 10*3/uL (ref 0.1–1.0)
Monocytes Relative: 7 %
Neutro Abs: 6.8 10*3/uL (ref 1.7–7.7)
Neutrophils Relative %: 73 %
Platelets: 117 10*3/uL — ABNORMAL LOW (ref 150–400)
RBC: 4.86 MIL/uL (ref 4.22–5.81)
RDW: 12.8 % (ref 11.5–15.5)
WBC: 9.4 10*3/uL (ref 4.0–10.5)
nRBC: 0 % (ref 0.0–0.2)

## 2023-10-25 LAB — COMPREHENSIVE METABOLIC PANEL
ALT: 21 U/L (ref 0–44)
AST: 23 U/L (ref 15–41)
Albumin: 4 g/dL (ref 3.5–5.0)
Alkaline Phosphatase: 91 U/L (ref 38–126)
Anion gap: 10 (ref 5–15)
BUN: 11 mg/dL (ref 8–23)
CO2: 30 mmol/L (ref 22–32)
Calcium: 8.9 mg/dL (ref 8.9–10.3)
Chloride: 100 mmol/L (ref 98–111)
Creatinine, Ser: 0.97 mg/dL (ref 0.61–1.24)
GFR, Estimated: >60 mL/min (ref >60)
Glucose, Bld: 126 mg/dL — ABNORMAL HIGH (ref 70–99)
Potassium: 3 mmol/L — ABNORMAL LOW (ref 3.5–5.1)
Sodium: 140 mmol/L (ref 135–145)
Total Bilirubin: 1.6 mg/dL — ABNORMAL HIGH (ref 0.0–1.2)
Total Protein: 7.5 g/dL (ref 6.5–8.1)

## 2023-10-25 LAB — MAGNESIUM: Magnesium: 1.8 mg/dL (ref 1.7–2.4)

## 2023-10-25 LAB — TROPONIN I (HIGH SENSITIVITY)
Troponin I (High Sensitivity): 8 ng/L (ref <18)
Troponin I (High Sensitivity): 9 ng/L (ref <18)

## 2023-10-25 MED ORDER — POTASSIUM CHLORIDE CRYS ER 20 MEQ PO TBCR
40.0000 meq | EXTENDED_RELEASE_TABLET | Freq: Once | ORAL | Status: AC
Start: 2023-10-25 — End: 2023-10-25
  Administered 2023-10-25: 40 meq via ORAL
  Filled 2023-10-25: qty 2

## 2023-10-25 MED ORDER — APIXABAN 5 MG PO TABS: 5.0000 mg | ORAL_TABLET | Freq: Two times a day (BID) | ORAL | 0 refills | Status: DC

## 2023-10-25 MED ORDER — METOPROLOL TARTRATE 5 MG/5ML IV SOLN
5.0000 mg | Freq: Once | INTRAVENOUS | Status: AC
  Administered 2023-10-25: 5 mg via INTRAVENOUS
  Filled 2023-10-25: qty 5

## 2023-10-25 MED ORDER — PANTOPRAZOLE SODIUM 20 MG PO TBEC
20.0000 mg | DELAYED_RELEASE_TABLET | Freq: Once | ORAL | Status: AC
  Administered 2023-10-25: 20 mg via ORAL
  Filled 2023-10-25: qty 1

## 2023-10-25 MED ORDER — DILTIAZEM HCL ER COATED BEADS 120 MG PO CP24
120.0000 mg | ORAL_CAPSULE | Freq: Once | ORAL | Status: AC
  Administered 2023-10-25: 120 mg via ORAL
  Filled 2023-10-25: qty 1

## 2023-10-25 MED ORDER — POTASSIUM CHLORIDE 10 MEQ/100ML IV SOLN: 10.0000 meq | Freq: Once | INTRAVENOUS | Status: DC

## 2023-10-25 MED ORDER — CLOPIDOGREL BISULFATE 75 MG PO TABS: 75.0000 mg | ORAL_TABLET | Freq: Every day | ORAL | 0 refills | Status: AC

## 2023-10-25 MED ORDER — CLOPIDOGREL BISULFATE 75 MG PO TABS
75.0000 mg | ORAL_TABLET | Freq: Once | ORAL | Status: AC
  Administered 2023-10-25: 75 mg via ORAL
  Filled 2023-10-25: qty 1

## 2023-10-25 MED ORDER — APIXABAN 5 MG PO TABS
5.0000 mg | ORAL_TABLET | Freq: Once | ORAL | Status: AC
  Administered 2023-10-25: 5 mg via ORAL
  Filled 2023-10-25: qty 1

## 2023-10-25 MED ORDER — TETANUS-DIPHTH-ACELL PERTUSSIS 5-2.5-18.5 LF-MCG/0.5 IM SUSY
0.5000 mL | PREFILLED_SYRINGE | Freq: Once | INTRAMUSCULAR | Status: AC
  Administered 2023-10-25: 0.5 mL via INTRAMUSCULAR
  Filled 2023-10-25: qty 0.5

## 2023-10-25 MED ORDER — METOPROLOL TARTRATE 50 MG PO TABS
100.0000 mg | ORAL_TABLET | Freq: Once | ORAL | Status: AC
  Administered 2023-10-25: 100 mg via ORAL
  Filled 2023-10-25: qty 2

## 2023-10-25 MED ORDER — DILTIAZEM HCL ER COATED BEADS 120 MG PO CP24: 120.0000 mg | ORAL_CAPSULE | Freq: Every day | ORAL | 0 refills | Status: AC

## 2023-10-25 MED ORDER — MAGNESIUM SULFATE 2 GM/50ML IV SOLN
2.0000 g | Freq: Once | INTRAVENOUS | Status: AC
  Administered 2023-10-25: 2 g via INTRAVENOUS
  Filled 2023-10-25: qty 50

## 2023-10-25 MED ORDER — METOPROLOL TARTRATE 100 MG PO TABS: 100.0000 mg | ORAL_TABLET | Freq: Two times a day (BID) | ORAL | 0 refills | Status: AC

## 2023-10-25 NOTE — ED Provider Notes (Signed)
 [facility] Provider Note    Event Date/Time   First MD Initiated Contact with Patient 10/25/23 0330     (approximate)   History   Palpitations   HPI  Carl Alvarez is a 78 y.o. male who presents to the ED for evaluation of Palpitations   Review ED visit from 12/31 as well as cardiology clinic visit from August.  History of HTN, HLD, stroke, A-fib on Eliquis .  Here in the ED, diagnosed with accidental overdose of NyQuil at home causing mild confusion and dizziness.  Patient presents from home via EMS for evaluation of palpitations and left-sided chest discomfort that has since improved by the time he gets here.  Reports they left all of his home medications here in the ED when he was seen here couple days ago.  He therefore has not had his medications at home.  He reports increasing palpitations over the past 24 hours.    He lives at home by himself.  He reached out to his son because he was feeling increased palpitations and discomfort, his son called 911 to bring him in.  By the time he arrived to the ED, patient reports feeling somewhat better.   Describes a mild headache since about 6 PM but no falls, injuries, syncopal episodes, weakness to the extremities.  Physical Exam   Triage Vital Signs: ED Triage Vitals  Encounter Vitals Group     BP 10/25/23 0325 (!) 200/114     Systolic BP Percentile --      Diastolic BP Percentile --      Pulse Rate 10/25/23 0325 88     Resp 10/25/23 0325 14     Temp 10/25/23 0325 98.4 F (36.9 C)     Temp Source 10/25/23 0325 Oral     SpO2 10/25/23 0325 95 %     Weight --      Height --      Head Circumference --      Peak Flow --      Pain Score 10/25/23 0329 7     Pain Loc --      Pain Education --      Exclude from Growth Chart --     Most recent vital signs: Vitals:   10/25/23 0530 10/25/23 0600  BP: (!) 187/106 (!) 193/113  Pulse: 88 92  Resp: 19 18  Temp:    SpO2: 95% 95%    General: Awake, no  distress.  Pleasant and conversational, looks well CV:  Good peripheral perfusion.  Tachycardic and irregular Resp:  Normal effort.  Abd:  No distention.  MSK:  No deformity noted.  Neuro:  No focal deficits appreciated. Other:     ED Results / Procedures / Treatments   Labs (all labs ordered are listed, but only abnormal results are displayed) Labs Reviewed  COMPREHENSIVE METABOLIC PANEL - Abnormal; Notable for the following components:      Result Value   Potassium 3.0 (*)    Glucose, Bld 126 (*)    Total Bilirubin 1.6 (*)    All other components within normal limits  CBC WITH DIFFERENTIAL/PLATELET - Abnormal; Notable for the following components:   Platelets 117 (*)    All other components within normal limits  MAGNESIUM   TROPONIN I (HIGH SENSITIVITY)  TROPONIN I (HIGH SENSITIVITY)    EKG A-fib with a rate of 103 bpm.  Normal axis and intervals.  No clear signs of acute ischemia.  RADIOLOGY CXR interpreted by  me without evidence of acute cardiopulmonary pathology.  Official radiology report(s): DG Chest 2 View Result Date: 10/25/2023 CLINICAL DATA:  Chest pain EXAM: CHEST - 2 VIEW COMPARISON:  10/23/2023 FINDINGS: Artifact from EKG leads. Normal heart size and mediastinal contours. No acute infiltrate or edema. No effusion or pneumothorax. No acute osseous findings. IMPRESSION: No evidence of active disease. Electronically Signed   By: Dorn Roulette M.D.   On: 10/25/2023 05:04    PROCEDURES and INTERVENTIONS:  .Critical Care  Performed by: Claudene Rover, MD Authorized by: Claudene Rover, MD   Critical care provider statement:    Critical care time (minutes):  30   Critical care time was exclusive of:  Separately billable procedures and treating other patients   Critical care was necessary to treat or prevent imminent or life-threatening deterioration of the following conditions:  Circulatory failure and cardiac failure   Critical care was time spent personally by me  on the following activities:  Development of treatment plan with patient or surrogate, discussions with consultants, evaluation of patient's response to treatment, examination of patient, ordering and review of laboratory studies, ordering and review of radiographic studies, ordering and performing treatments and interventions, pulse oximetry, re-evaluation of patient's condition and review of old charts .1-3 Lead EKG Interpretation  Performed by: Claudene Rover, MD Authorized by: Claudene Rover, MD     Interpretation: abnormal     ECG rate:  110   ECG rate assessment: tachycardic     Rhythm: atrial fibrillation     Ectopy: none     Conduction: normal     Medications  apixaban  (ELIQUIS ) tablet 5 mg (5 mg Oral Given 10/25/23 0418)  clopidogrel  (PLAVIX ) tablet 75 mg (75 mg Oral Given 10/25/23 0400)  diltiazem  (CARDIZEM  CD) 24 hr capsule 120 mg (120 mg Oral Given 10/25/23 0419)  metoprolol  tartrate (LOPRESSOR ) tablet 100 mg (100 mg Oral Given 10/25/23 0359)  pantoprazole  (PROTONIX ) EC tablet 20 mg (20 mg Oral Given 10/25/23 0418)  metoprolol  tartrate (LOPRESSOR ) injection 5 mg (5 mg Intravenous Given 10/25/23 0402)  potassium chloride  SA (KLOR-CON  M) CR tablet 40 mEq (40 mEq Oral Given 10/25/23 0451)  magnesium  sulfate IVPB 2 g 50 mL (0 g Intravenous Stopped 10/25/23 0558)  potassium chloride  SA (KLOR-CON  M) CR tablet 40 mEq (40 mEq Oral Given 10/25/23 0651)     IMPRESSION / MDM / ASSESSMENT AND PLAN / ED COURSE  I reviewed the triage vital signs and the nursing notes.  Differential diagnosis includes, but is not limited to, ACS, PTX, PNA, muscle strain/spasm, PE, dissection, anxiety, pleural effusion  {Patient presents with symptoms of an acute illness or injury that is potentially life-threatening.  78 year old male presents with palpitations in the setting of losing his prescriptions, with evidence of mild A-fib with RVR and hypokalemia, ultimately suitable for outpatient management.  Presents with rapid  A-fib with rates in the 110s, resolved with a single dose of IV metoprolol  in addition to typical home medications.  Workup with hypokalemia, mild hypomagnesemia that is replaced orally and IV.  Otherwise he is 2 negative troponins, clear CXR and a normal CBC.  While I considered medical admission, he is now asymptomatic, rate controlled and suitable for outpatient trial.  I sent prescriptions of his typical prescription medications for him for the next 1 month and advised him to follow-up with his PCP.  Clinical Course as of 10/25/23 0654  Thu Oct 25, 2023  0636 reassessed [DS]  0650 Reassessed.  Still feeling well.  Has  no complaints and reports feeling better.  We discussed low potassium but largely benign workup otherwise.  He is requesting going home.  We discussed home medications and I can write prescriptions for his cardiac medications.  He is adult nurse. [DS]    Clinical Course User Index [DS] Claudene Rover, MD     FINAL CLINICAL IMPRESSION(S) / ED DIAGNOSES   Final diagnoses:  Palpitations  Atrial fibrillation with RVR (HCC)  Other chest pain  Hypokalemia     Rx / DC Orders   ED Discharge Orders          Ordered    apixaban  (ELIQUIS ) 5 MG TABS tablet  2 times daily        10/25/23 0652    diltiazem  (CARDIZEM  CD) 120 MG 24 hr capsule  Daily        10/25/23 0652    metoprolol  tartrate (LOPRESSOR ) 100 MG tablet  2 times daily        10/25/23 9347    clopidogrel  (PLAVIX ) 75 MG tablet  Daily        10/25/23 9347             Note:  This document was prepared using Dragon voice recognition software and may include unintentional dictation errors.   Claudene Rover, MD 10/25/23 [phone]

## 2023-10-25 NOTE — ED Triage Notes (Signed)
 Patient arrives by [facility] with complaint of headache and heart palpitations.  Patient was seen here for same on 10/23/23.  Patient says he has not taken any medication at home because he left it all at the hospital yesterday.

## 2023-10-25 NOTE — ED Notes (Addendum)
 Patient ambulated in the hallway with Dr. Rosalia Hammers and the ED tech. Patient had to be reminded not to walk fast and did stumble when turning around quickly. Dr. Rosalia Hammers reminded the patient to walk slower. Family is with the patient.
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
## 2023-10-25 NOTE — Discharge Instructions (Signed)
 You were seen in the emergency department today for evaluation after fall.  You do have a fracture in your hand.  Keep your splint in place until you are able to arrange follow-up with orthopedics.  Please be very careful when you are getting around and walk slowly.  Follow-up with your primary care doctor for further evaluation.  Return to the ER for new or worsening symptoms.
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
## 2024-03-27 ENCOUNTER — Encounter: Payer: Self-pay | Admitting: Cardiovascular Disease

## 2024-03-27 DIAGNOSIS — I4821 Permanent atrial fibrillation: Secondary | ICD-10-CM

## 2024-03-27 MED ORDER — APIXABAN 5 MG PO TABS: 5.0000 mg | ORAL_TABLET | Freq: Two times a day (BID) | ORAL | 1 refills | Status: DC

## 2024-03-27 NOTE — Telephone Encounter (Signed)
 The patient has been call to notify of Eliquis  refill and pt on the way to pickup meds when I called.  Appt made with Verdie Gladden 7/3 and reminder letter sent per pt request. Pt verbalized understanding. All questions were answered.  Pt encouraged to call if any concerns or questions arise

## 2024-03-27 NOTE — Telephone Encounter (Signed)
 Sample/Refill request for Eliquis  received. Indication:Aflutter Last office visit: 06/22/23 [place]) - Due in August 2025 Scr: 0.97 (10/25/23)  Age: 78 Weight: 99.8kg  Eliquis  5mg  BID is appropriate dose. Refill sent to pharmacy. Okay to provide pt with samples if needed.

## 2024-04-18 ENCOUNTER — Encounter: Payer: Self-pay | Admitting: Medical

## 2024-04-18 ENCOUNTER — Ambulatory Visit: Attending: Medical | Admitting: Medical

## 2024-04-18 VITALS — BP 128/86 | HR 86 | Ht 71.0 in | Wt 209.2 lb

## 2024-04-18 DIAGNOSIS — I2510 Atherosclerotic heart disease of native coronary artery without angina pectoris: Secondary | ICD-10-CM | POA: Diagnosis not present

## 2024-04-18 DIAGNOSIS — E785 Hyperlipidemia, unspecified: Secondary | ICD-10-CM | POA: Diagnosis not present

## 2024-04-18 DIAGNOSIS — I10 Essential (primary) hypertension: Secondary | ICD-10-CM | POA: Diagnosis not present

## 2024-04-18 DIAGNOSIS — I4821 Permanent atrial fibrillation: Secondary | ICD-10-CM | POA: Diagnosis not present

## 2024-04-18 MED ORDER — DILTIAZEM HCL ER COATED BEADS 120 MG PO CP24: ORAL_CAPSULE | ORAL | 3 refills | Status: AC

## 2024-04-18 MED ORDER — METOPROLOL TARTRATE 100 MG PO TABS
100.0000 mg | ORAL_TABLET | Freq: Two times a day (BID) | ORAL | 3 refills | Status: AC
Start: 2024-04-18 — End: ?

## 2024-04-18 MED ORDER — APIXABAN 5 MG PO TABS: 5.0000 mg | ORAL_TABLET | Freq: Two times a day (BID) | ORAL | 1 refills | Status: AC

## 2024-04-18 NOTE — Patient Instructions (Signed)
 Medication Instructions:   Your physician recommends that you continue on your current medications as directed. Please refer to the Current Medication list given to you today.   *If you need a refill on your cardiac medications before your next appointment, please call your pharmacy*  Lab Work: No labs ordered today  If you have labs (blood work) drawn today and your tests are completely normal, you will receive your results only by: MyChart Message (if you have MyChart) OR A paper copy in the mail If you have any lab test that is abnormal or we need to change your treatment, we will call you to review the results.  Testing/Procedures: No test ordered today   Follow-Up: At [facility], you and your health needs are our priority.  As part of our continuing mission to provide you with exceptional heart care, our providers are all part of one team.  This team includes your primary Cardiologist (physician) and Advanced Practice Providers or APPs (Physician Assistants and Nurse Practitioners) who all work together to provide you with the care you need, when you need it.  Your next appointment:   12 month(s)  Provider:   You may see Timothy Gollan, MD or one of the following Advanced Practice Providers on your designated Care Team:   Lonni Meager, NP Lesley Maffucci, PA-C Bernardino Bring, PA-C Cadence Benton, PA-C Tylene Lunch, NP Barnie Hila, NP    We recommend signing up for the patient portal called MyChart.  Sign up information is provided on this After Visit Summary.  MyChart is used to connect with patients for Virtual Visits (Telemedicine).  Patients are able to view lab/test results, encounter notes, upcoming appointments, etc.  Non-urgent messages can be sent to your provider as well.   To learn more about what you can do with MyChart, go to [email or web address].

## 2024-04-18 NOTE — Progress Notes (Unsigned)
 Cardiology Office Note   Date:  04/21/2024  ID:  Kamarrion, Stfort [birth year], MRN [id number] PCP: Gretel App, NP  [facility] Providers Cardiologist:  Evalene Lunger, MD    History of Present Illness ADIR SCHICKER is a 78 y.o. male with a history of nonobstructive CAD by LHC in 2019, atrial flutter status post RFCA, permanent A-fib with failed DCCVs on Eliquis , CVA in 02/2021, TIA in2013, HTN, HLD, vertigo, COPD, and smoking who presents for follow-up.   He was admitted in 10/2011 with possible TIA in the setting of newly found Afib and was started on Pradaxa  at that time. Echo showed LVEF 50-55% with no significant valvular abnormalities. Follow-up EKG showed atrial flutter with the patient being started on amiodarone  in an effort to pharmacologically cardiovert, which was unsuccessful. He ultimately underwent DCCV in 01/2012. Follow-up knee surgery 09/2013, he was found to be back in Aflutter. He developed recurrent Afib in 06/2015 and preferred conservative management at that time and has been in Afib since. He was siin 04/2018, with exertional chest tightness and SOB, and underwent diagnostic LHC on 05/20/2018, that showed nonobstructive CAD with heavily calcified LAD as detailed below with an EF >55% with medical management and risk factor modification advised.   He was admitted to [facility] 02/2021 with stroke. Echo showed LVEF 55-60%, no WMA, mild LVH, indeterminate LV diastolic function parameters, no significant valvular abnormalities, estimated right atrial pressure of , and negative bubble study. TEE showed LVEF 55%, no WMA, normal RVSF, no appendage or thrombus, concern for small lambl's excrescence, mild to mod transverse aortic atherosclerosis, and negative bubble study. Carotid artery US  showed no hemodynamically significant stenosis of the bilateral ICAs and antegrade flow of the bilateral vertebral arteries.   The patient was last seen 05/2023 and was dong well from a cardiac  perspective.   Today, the patient reports he is overall doing well. 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 He denies chest pain, SOB, lower leg edema, palpitations. He changed his diet and lost weight, 225>209lb  BMET  Studies Reviewed EKG Interpretation Date/Time:  Friday April 18 2024 14:13:14 EDT Ventricular Rate:  86 PR Interval:    QRS Duration:  82 QT Interval:  362 QTC Calculation: 433 R Axis:   -17  Text Interpretation: Atrial fibrillation Inferior infarct , age undetermined When compared with ECG of 25-Oct-2023 03:33, PREVIOUS ECG IS PRESENT Confirmed by Franchester, Ori Trejos ([id number]) on 04/18/2024 2:26:45 PM    TEE 03/10/2021: 1. No left atrial or left atrial appendage thrombus.   2. Left ventricular ejection fraction, by estimation, is 55 %. The left  ventricle has normal function. The left ventricle has no regional wall  motion abnormalities.   3. Right ventricular systolic function is normal. The right ventricular  size is normal.   4. Left atrial size was moderately dilated. No left atrial/left atrial  appendage thrombus was detected.   5. The aortic valve is normal in structure. Concern for small lambl's  excrescence   6. There is mild to moderate (Grade III) atheroma plaque involving the  transverse aorta.   7. Agitated saline contrast bubble study was negative, with no evidence  of any interatrial shunt. __________   2D echo 03/09/2021: 1. Left ventricular ejection fraction, by estimation, is 55 to 60%. The  left ventricle has normal function. The left ventricle has no regional  wall motion abnormalities. There is mild left ventricular hypertrophy.  Left ventricular diastolic parameters  are indeterminate.   2.  Right ventricular systolic function was not well visualized. The right  ventricular size is not well visualized.   3. The mitral valve is normal in structure. No evidence of mitral valve  regurgitation.   4. The aortic valve was not well visualized. Aortic valve  regurgitation  is not visualized.   5. The inferior vena cava is normal in size with greater than 50%  respiratory variability, suggesting right atrial pressure of 3 mmHg.   6. Agitated saline contrast bubble study was negative, with no evidence  of any interatrial shunt. __________     LHC 04/2018: 1. Left ventricular ejection fraction, by estimation, is 55 to 60%. The  left ventricle has normal function. The left ventricle has no regional  wall motion abnormalities. There is mild left ventricular hypertrophy.  Left ventricular diastolic parameters  are indeterminate.   2. Right ventricular systolic function was not well visualized. The right  ventricular size is not well visualized.   3. The mitral valve is normal in structure. No evidence of mitral valve  regurgitation.   4. The aortic valve was not well visualized. Aortic valve regurgitation  is not visualized.   5. The inferior vena cava is normal in size with greater than 50%  respiratory variability, suggesting right atrial pressure of 3 mmHg.   6. Agitated saline contrast bubble study was negative, with no evidence  of any interatrial shunt. __________   2D echo 12/2013: - Left ventricle: The cavity size was normal. Wall thickness    was normal. Systolic function was vigorous. The estimated    ejection fraction was in the range of 65% to 70%. Wall    motion was normal; there were no regional wall motion    abnormalities.  - Right atrium: The atrium was mildly dilated.       Physical Exam VS:  BP 128/86   Pulse 86   Ht 5' 11 (1.803 m)   Wt 209 lb 3.2 oz (94.9 kg)   SpO2 98%   BMI 29.18 kg/m        Wt Readings from Last 3 Encounters:  04/18/24 209 lb 3.2 oz (94.9 kg)  10/25/23 220 lb 0.3 oz (99.8 kg)  10/23/23 220 lb (99.8 kg)    GEN: Well nourished, well developed in no acute distress NECK: No JVD; No carotid bruits CARDIAC: RRR, no murmurs, rubs, gallops RESPIRATORY:  Clear to auscultation without rales,  wheezing or rhonchi  ABDOMEN: Soft, non-tender, non-distended EXTREMITIES:  No edema; No deformity   ASSESSMENT AND PLAN  Nonobstructive CAD The patient denies anginal symptoms. He remains active. He changed his diet and lost 15-20lbs. No ASA given Eliquis . Continue metoprolol , Lipitor, and Zetia .   Aflutter s/p RFCA with subsequent permanent Afib EKG shows rate controlled Afib. Continue Eliquis  5mg  BID for stroke ppx. Continue Cardizem  120mg  daily and Lopressor  100mg  daily for rate control.   HTN BP is good today, continue current medications.   Aortic atherosclerosis/HLD LDL 74 in 10/2022. Continue Zetia .      Dispo: Follow-up in 1 year  Signed, Leman Martinek VEAR Fishman, PA-C

## 2024-04-24 ENCOUNTER — Ambulatory Visit: Admitting: Physician Assistant

## 2024-10-16 ENCOUNTER — Other Ambulatory Visit: Payer: Self-pay

## 2024-10-16 ENCOUNTER — Emergency Department

## 2024-10-16 ENCOUNTER — Emergency Department
Admission: EM | Admit: 2024-10-16 | Discharge: 2024-10-16 | Disposition: A | Discharge status: Home / Self Care | Attending: Emergency Medicine | Admitting: Emergency Medicine

## 2024-10-16 DIAGNOSIS — I2510 Atherosclerotic heart disease of native coronary artery without angina pectoris: Secondary | ICD-10-CM | POA: Diagnosis not present

## 2024-10-16 DIAGNOSIS — Z96651 Presence of right artificial knee joint: Secondary | ICD-10-CM | POA: Insufficient documentation

## 2024-10-16 DIAGNOSIS — Z7902 Long term (current) use of antithrombotics/antiplatelets: Secondary | ICD-10-CM | POA: Insufficient documentation

## 2024-10-16 DIAGNOSIS — J101 Influenza due to other identified influenza virus with other respiratory manifestations: Secondary | ICD-10-CM | POA: Insufficient documentation

## 2024-10-16 DIAGNOSIS — Z7901 Long term (current) use of anticoagulants: Secondary | ICD-10-CM | POA: Diagnosis not present

## 2024-10-16 DIAGNOSIS — I4891 Unspecified atrial fibrillation: Secondary | ICD-10-CM | POA: Insufficient documentation

## 2024-10-16 DIAGNOSIS — I10 Essential (primary) hypertension: Secondary | ICD-10-CM | POA: Insufficient documentation

## 2024-10-16 DIAGNOSIS — Z79899 Other long term (current) drug therapy: Secondary | ICD-10-CM | POA: Insufficient documentation

## 2024-10-16 DIAGNOSIS — Z8616 Personal history of COVID-19: Secondary | ICD-10-CM | POA: Diagnosis not present

## 2024-10-16 DIAGNOSIS — R42 Dizziness and giddiness: Secondary | ICD-10-CM | POA: Insufficient documentation

## 2024-10-16 DIAGNOSIS — R509 Fever, unspecified: Secondary | ICD-10-CM | POA: Diagnosis present

## 2024-10-16 LAB — CBC WITH DIFFERENTIAL/PLATELET
Abs Immature Granulocytes: 0.03 K/uL (ref 0.00–0.07)
Basophils Absolute: 0 K/uL (ref 0.0–0.1)
Basophils Relative: 0 %
Eosinophils Absolute: 0 K/uL (ref 0.0–0.5)
Eosinophils Relative: 0 %
HCT: 44.3 % (ref 39.0–52.0)
Hemoglobin: 14.3 g/dL (ref 13.0–17.0)
Immature Granulocytes: 0 %
Lymphocytes Relative: 9 %
Lymphs Abs: 0.6 K/uL — ABNORMAL LOW (ref 0.7–4.0)
MCH: 30.1 pg (ref 26.0–34.0)
MCHC: 32.3 g/dL (ref 30.0–36.0)
MCV: 93.3 fL (ref 80.0–100.0)
Monocytes Absolute: 0.6 K/uL (ref 0.1–1.0)
Monocytes Relative: 8 %
Neutro Abs: 6.2 K/uL (ref 1.7–7.7)
Neutrophils Relative %: 83 %
Platelets: 95 K/uL — ABNORMAL LOW (ref 150–400)
RBC: 4.75 MIL/uL (ref 4.22–5.81)
RDW: 12.8 % (ref 11.5–15.5)
Smear Review: NORMAL
WBC: 7.4 K/uL (ref 4.0–10.5)
nRBC: 0 % (ref 0.0–0.2)

## 2024-10-16 LAB — COMPREHENSIVE METABOLIC PANEL WITH GFR
ALT: 17 U/L (ref 0–44)
AST: 24 U/L (ref 15–41)
Albumin: 4.3 g/dL (ref 3.5–5.0)
Alkaline Phosphatase: 96 U/L (ref 38–126)
Anion gap: 12 (ref 5–15)
BUN: 9 mg/dL (ref 8–23)
CO2: 27 mmol/L (ref 22–32)
Calcium: 8.9 mg/dL (ref 8.9–10.3)
Chloride: 104 mmol/L (ref 98–111)
Creatinine, Ser: 1.01 mg/dL (ref 0.61–1.24)
GFR, Estimated: >60 mL/min
Glucose, Bld: 104 mg/dL — ABNORMAL HIGH (ref 70–99)
Potassium: 3.5 mmol/L (ref 3.5–5.1)
Sodium: 143 mmol/L (ref 135–145)
Total Bilirubin: 0.8 mg/dL (ref 0.0–1.2)
Total Protein: 7.4 g/dL (ref 6.5–8.1)

## 2024-10-16 LAB — URINALYSIS, W/ REFLEX TO CULTURE (INFECTION SUSPECTED)
Bacteria, UA: NONE SEEN
Bilirubin Urine: NEGATIVE
Glucose, UA: NEGATIVE mg/dL
Ketones, ur: NEGATIVE mg/dL
Leukocytes,Ua: NEGATIVE
Nitrite: NEGATIVE
Protein, ur: 100 mg/dL — AB
Specific Gravity, Urine: 1.019 (ref 1.005–1.030)
Squamous Epithelial / HPF: 0 /HPF (ref 0–5)
pH: 5 (ref 5.0–8.0)

## 2024-10-16 LAB — LACTIC ACID, PLASMA: Lactic Acid, Venous: 1.3 mmol/L (ref 0.5–1.9)

## 2024-10-16 LAB — RESP PANEL BY RT-PCR (RSV, FLU A&B, COVID)  RVPGX2
Influenza A by PCR: POSITIVE — AB
Influenza B by PCR: NEGATIVE
Resp Syncytial Virus by PCR: NEGATIVE
SARS Coronavirus 2 by RT PCR: NEGATIVE

## 2024-10-16 LAB — PROCALCITONIN: Procalcitonin: 0.1 ng/mL

## 2024-10-16 LAB — TROPONIN T, HIGH SENSITIVITY: Troponin T High Sensitivity: <15 ng/L (ref 0–19)

## 2024-10-16 MED ORDER — MECLIZINE HCL 25 MG PO TABS: 25.0000 mg | ORAL_TABLET | Freq: Three times a day (TID) | ORAL | 0 refills | Status: AC | PRN

## 2024-10-16 MED ORDER — SODIUM CHLORIDE 0.9 % IV SOLN: INTRAVENOUS | Status: DC

## 2024-10-16 MED ORDER — OSELTAMIVIR PHOSPHATE 75 MG PO CAPS: 75.0000 mg | ORAL_CAPSULE | Freq: Two times a day (BID) | ORAL | 0 refills | Status: AC

## 2024-10-16 MED ORDER — ACETAMINOPHEN 500 MG PO TABS
1000.0000 mg | ORAL_TABLET | Freq: Once | ORAL | Status: AC
  Administered 2024-10-16: 1000 mg via ORAL
  Filled 2024-10-16: qty 2

## 2024-10-16 MED ORDER — SODIUM CHLORIDE 0.9 % IV BOLUS
1000.0000 mL | Freq: Once | INTRAVENOUS | Status: AC
  Administered 2024-10-16: 1000 mL via INTRAVENOUS

## 2024-10-16 MED ORDER — MECLIZINE HCL 25 MG PO TABS
25.0000 mg | ORAL_TABLET | Freq: Once | ORAL | Status: AC
  Administered 2024-10-16: 25 mg via ORAL
  Filled 2024-10-16: qty 1

## 2024-10-16 NOTE — ED Triage Notes (Signed)
 Pt BIB [facility] from home c/o vertigo and diaphoresis when EMS arrived. EMS reports pt is not compliant with meds. 99.5 for EMS, but house was warm and pt was wearing 4 layers of clothing
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
## 2024-10-16 NOTE — ED Provider Notes (Signed)
 Emergency department handoff note  Care of this patient was signed out to me at the end of the previous provider shift.  All pertinent patient information was conveyed and all questions were answered.  Patient pending MRI of the brain plus meclizine  and symptomatic improvement.  Patient is ambulatory without difficulty on reassessment and MRI of the brain shows no evidence of acute abnormalities.  The patient has been reexamined and is ready to be discharged.  All diagnostic results have been reviewed and discussed with the patient/family.  Care plan has been outlined and the patient/family understands all current diagnoses, results, and treatment plans.  There are no new complaints, changes, or physical findings at this time.  All questions have been addressed and answered.  Patient was instructed to, and agrees to follow-up with their primary care physician as well as return to the emergency department if any new or worsening symptoms develop.   Akemi Overholser K, MD 10/16/24 [phone]

## 2024-10-16 NOTE — ED Notes (Signed)
 Pt ambulated to in room toilet without assistance from staff. Pt denies dizziness, lightheadedness. Pt tolerated activity well.

## 2024-10-20 ENCOUNTER — Telehealth: Payer: Self-pay | Admitting: Emergency Medicine

## 2024-10-20 NOTE — Telephone Encounter (Signed)
 Called patient due to blood culture positive.  It has been 4 days, so I inquired about his condition. Patient says he is feeling fine, all better, and he is at work.

## 2024-10-22 LAB — CULTURE, BLOOD (SINGLE): Special Requests: ADEQUATE

## 2024-10-28 ENCOUNTER — Telehealth: Payer: Self-pay

## 2024-10-28 NOTE — Telephone Encounter (Signed)
 C-SNP verification form placed in provider folder

## 2024-10-30 ENCOUNTER — Telehealth: Payer: Self-pay | Admitting: Nurse Practitioner

## 2024-10-30 NOTE — Telephone Encounter (Signed)
 Copied from CRM [id number]. Topic: General - Other >> Oct 29, 2024  5:02 PM Hadassah PARAS wrote: Reason for CRM: Inge from [employer] is requesting paperworking stating pt has chronic conditions in order to qualify for benefit plans. Inge will be faxing this over now, please send back to [facility] [phone]

## 2024-11-03 ENCOUNTER — Telehealth: Payer: Self-pay | Admitting: Cardiovascular Disease

## 2024-11-03 NOTE — Telephone Encounter (Signed)
 Patient brought by form to be signed by Dr. Gollan for patient's insurance. Form placed in Dr. Tresia nurse box. When completed, please fax to [phone]. Thank you

## 2024-11-03 NOTE — Telephone Encounter (Signed)
 Form completed and faxed.

## 2024-11-06 NOTE — Telephone Encounter (Signed)
 faxed
